# Patient Record
Sex: Male | Born: 1960 | Race: White | Hispanic: No | Marital: Married | Smoking: Former smoker
Health system: Southern US, Community
[De-identification: ages and names within clinical notes are randomized; demographics above are authoritative.]

## PROBLEM LIST (undated history)

## (undated) DIAGNOSIS — J449 Chronic obstructive pulmonary disease, unspecified: Secondary | ICD-10-CM

## (undated) DIAGNOSIS — J45909 Unspecified asthma, uncomplicated: Secondary | ICD-10-CM

## (undated) DIAGNOSIS — R768 Other specified abnormal immunological findings in serum: Secondary | ICD-10-CM

## (undated) DIAGNOSIS — G4700 Insomnia, unspecified: Secondary | ICD-10-CM

## (undated) DIAGNOSIS — G8929 Other chronic pain: Secondary | ICD-10-CM

## (undated) DIAGNOSIS — M549 Dorsalgia, unspecified: Secondary | ICD-10-CM

## (undated) DIAGNOSIS — R0602 Shortness of breath: Secondary | ICD-10-CM

## (undated) HISTORY — DX: Unspecified asthma, uncomplicated: J45.909

## (undated) HISTORY — DX: Other specified abnormal immunological findings in serum: R76.8

## (undated) HISTORY — DX: Dorsalgia, unspecified: M54.9

## (undated) HISTORY — DX: Insomnia, unspecified: G47.00

## (undated) HISTORY — DX: Other chronic pain: G89.29

---

## 2005-05-01 ENCOUNTER — Ambulatory Visit (HOSPITAL_COMMUNITY): Admission: RE | Admit: 2005-05-01 | Discharge: 2005-05-01 | Payer: Self-pay | Admitting: Family Medicine

## 2005-05-27 ENCOUNTER — Ambulatory Visit: Payer: Self-pay | Admitting: Critical Care Medicine

## 2005-07-08 ENCOUNTER — Ambulatory Visit: Payer: Self-pay | Admitting: Critical Care Medicine

## 2005-08-06 ENCOUNTER — Ambulatory Visit: Payer: Self-pay | Admitting: Critical Care Medicine

## 2005-08-20 ENCOUNTER — Ambulatory Visit: Payer: Self-pay | Admitting: Critical Care Medicine

## 2005-11-20 ENCOUNTER — Ambulatory Visit: Payer: Self-pay | Admitting: Critical Care Medicine

## 2006-01-09 ENCOUNTER — Encounter (HOSPITAL_COMMUNITY): Admission: RE | Admit: 2006-01-09 | Discharge: 2006-02-08 | Payer: Self-pay | Admitting: Preventative Medicine

## 2007-07-06 ENCOUNTER — Ambulatory Visit: Payer: Self-pay | Admitting: Critical Care Medicine

## 2007-07-06 DIAGNOSIS — J449 Chronic obstructive pulmonary disease, unspecified: Secondary | ICD-10-CM | POA: Insufficient documentation

## 2007-07-06 DIAGNOSIS — R0602 Shortness of breath: Secondary | ICD-10-CM

## 2007-07-06 DIAGNOSIS — R0609 Other forms of dyspnea: Secondary | ICD-10-CM | POA: Insufficient documentation

## 2007-07-06 DIAGNOSIS — J209 Acute bronchitis, unspecified: Secondary | ICD-10-CM

## 2008-03-25 ENCOUNTER — Ambulatory Visit (HOSPITAL_COMMUNITY): Admission: RE | Admit: 2008-03-25 | Discharge: 2008-03-25 | Payer: Self-pay | Admitting: Family Medicine

## 2008-03-29 ENCOUNTER — Ambulatory Visit (HOSPITAL_COMMUNITY): Admission: RE | Admit: 2008-03-29 | Discharge: 2008-03-29 | Payer: Self-pay | Admitting: Family Medicine

## 2009-08-28 ENCOUNTER — Ambulatory Visit (HOSPITAL_COMMUNITY): Admission: RE | Admit: 2009-08-28 | Discharge: 2009-08-28 | Payer: Self-pay | Admitting: Family Medicine

## 2010-10-14 LAB — BLOOD GAS, ARTERIAL
Acid-Base Excess: 1.6 mmol/L (ref 0.0–2.0)
Bicarbonate: 25.4 mEq/L — ABNORMAL HIGH (ref 20.0–24.0)
FIO2: 0.21 %
O2 Saturation: 96.5 %
Patient temperature: 37
TCO2: 21.9 mmol/L (ref 0–100)
pCO2 arterial: 38.2 mmHg (ref 35.0–45.0)
pH, Arterial: 7.438 (ref 7.350–7.450)
pO2, Arterial: 64.6 mmHg — ABNORMAL LOW (ref 80.0–100.0)

## 2010-12-11 NOTE — Assessment & Plan Note (Signed)
[facility]                             PULMONARY OFFICE NOTE   NAME:Banka, [place] M                        MRN:          [id number]  DATE:07/06/2007                            DOB:          [birth year]    HISTORY OF PRESENT ILLNESS:  The patient is a 50 year old male patient  of Dr. 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  The patient has not been seen in the office in  greater than a year and a half, reports that he has been doing well,  maintained on Advair 500/50 twice daily.  The patient did run out of his  Advair one week ago, has been noticing symptoms had been increasing over  the last 24 hours.  The patient denies any chest pain, orthopnea, PND,  wheezing.  The patient is followed by Dr. Gerda Diss and reports he had a  chest x-ray earlier this year.   PAST MEDICAL HISTORY:  Reviewed.   CURRENT MEDICATIONS:  Reviewed.   PHYSICAL EXAMINATION:  GENERAL:  The patient is a pleasant male in no  acute distress.  He is afebrile.  VITAL SIGNS:  Blood pressure re-checks 138/70, O2 saturation 96% on room  air.  HEENT:  Unremarkable.  NECK:  Supple without cervical adenopathy.  No JVD.  LUNGS:  Clear to auscultation bilaterally.  CARDIAC:  Regular rate and rhythm.  ABDOMEN:  Soft and nontender.  EXTREMITIES:  Warm without any edema.   IMPRESSION/PLAN:  Chronic asthmatic bronchitis.  Had recommended  possibly we decrease Advair down to 250.  However, the patient reports  that he has tried that in the past without success, with increased  symptomatology.  So, therefore we will continue on Advair 500/50 twice  daily.  The patient will return back with Dr. Delford Field in two to three  months for followup or sooner if needed.      Rubye Oaks, NP  Electronically Signed      Charlcie Cradle Delford Field, MD, [facility]  Electronically Signed   TP/MedQ  DD: 07/06/2007  DT: 07/07/2007  Job #: [id number]

## 2010-12-14 NOTE — Procedures (Signed)
[name], Timothy Davis                 ACCOUNT NO.:  [id number]   MEDICAL RECORD NO.:  [id number]          PATIENT TYPE:  OUT   LOCATION:  RAD                           FACILITY:  [facility]   PHYSICIAN:  Edward L. Juanetta Gosling, M.D.DATE OF BIRTH:  1960-10-12   DATE OF PROCEDURE:  05/01/2005  DATE OF DISCHARGE:                              PULMONARY FUNCTION TEST   RESULTS:  1.  Spirometry shows marked obstructive change with a moderate to severe      ventilatory defect.  2.  There is significant bronchodilator improvement.      Edward L. Juanetta Gosling, M.D.  Electronically Signed     ELH/MEDQ  D:  05/02/2005  T:  05/03/2005  Job:  [id number]

## 2011-05-01 LAB — CREATININE, SERUM
Creatinine, Ser: 0.98
GFR calc Af Amer: >60
GFR calc non Af Amer: >60

## 2012-03-02 ENCOUNTER — Other Ambulatory Visit: Payer: Self-pay

## 2012-03-02 DIAGNOSIS — J449 Chronic obstructive pulmonary disease, unspecified: Secondary | ICD-10-CM

## 2012-03-03 DIAGNOSIS — Z79899 Other long term (current) drug therapy: Secondary | ICD-10-CM | POA: Diagnosis not present

## 2012-03-03 DIAGNOSIS — Z125 Encounter for screening for malignant neoplasm of prostate: Secondary | ICD-10-CM | POA: Diagnosis not present

## 2012-03-03 DIAGNOSIS — Z0000 Encounter for general adult medical examination without abnormal findings: Secondary | ICD-10-CM | POA: Diagnosis not present

## 2012-03-05 ENCOUNTER — Ambulatory Visit (HOSPITAL_COMMUNITY)
Admission: RE | Admit: 2012-03-05 | Discharge: 2012-03-05 | Disposition: A | Discharge status: Home / Self Care | Payer: PRIVATE HEALTH INSURANCE | Source: Ambulatory Visit | Attending: Family Medicine | Admitting: Family Medicine

## 2012-03-05 ENCOUNTER — Telehealth: Payer: Self-pay

## 2012-03-05 DIAGNOSIS — R0989 Other specified symptoms and signs involving the circulatory and respiratory systems: Secondary | ICD-10-CM | POA: Insufficient documentation

## 2012-03-05 DIAGNOSIS — R0609 Other forms of dyspnea: Secondary | ICD-10-CM | POA: Insufficient documentation

## 2012-03-05 DIAGNOSIS — J449 Chronic obstructive pulmonary disease, unspecified: Secondary | ICD-10-CM | POA: Insufficient documentation

## 2012-03-05 DIAGNOSIS — Z139 Encounter for screening, unspecified: Secondary | ICD-10-CM

## 2012-03-05 DIAGNOSIS — J4489 Other specified chronic obstructive pulmonary disease: Secondary | ICD-10-CM | POA: Insufficient documentation

## 2012-03-05 LAB — BLOOD GAS, ARTERIAL
Acid-Base Excess: 0.5 mmol/L (ref 0.0–2.0)
O2 Saturation: 96.2 %
Patient temperature: 37
TCO2: 21.3 mmol/L (ref 0–100)
pCO2 arterial: 38.4 mmHg (ref 35.0–45.0)
pH, Arterial: 7.421 (ref 7.350–7.450)
pO2, Arterial: 85.4 mmHg (ref 80.0–100.0)

## 2012-03-05 MED ORDER — ALBUTEROL SULFATE (5 MG/ML) 0.5% IN NEBU
2.5000 mg | INHALATION_SOLUTION | Freq: Once | RESPIRATORY_TRACT | Status: AC
  Administered 2012-03-05: 2.5 mg via RESPIRATORY_TRACT

## 2012-03-05 NOTE — Telephone Encounter (Signed)
PT left message on VM he was referred by Dr/ Lubertha South for colonoscopy. I called, LMOM for a return call.

## 2012-03-06 NOTE — Telephone Encounter (Addendum)
Gastroenterology Pre-Procedure Form  PT HAD COPD  Request Date: 03/05/2012      Requesting Physician: Dr. Lubertha South     PATIENT INFORMATION:  Timothy Davis is a 51 y.o., male (DOB=11-29-1960).  PROCEDURE: Procedure(s) requested: colonoscopy Procedure Reason: screening for colon cancer  PATIENT REVIEW QUESTIONS: The patient reports the following:   1. Diabetes Melitis: no 2. Joint replacements in the past 12 months: no 3. Major health problems in the past 3 months: no 4. Has an artificial valve or MVP:no 5. Has been advised in past to take antibiotics in advance of a procedure like teeth cleaning: no}    MEDICATIONS & ALLERGIES:    Patient reports the following regarding taking any blood thinners:   Plavix? no Aspirin? NO Coumadin?  no  Patient confirms/reports the following medications:  Current Outpatient Prescriptions  Medication Sig Dispense Refill  . budesonide-formoterol (SYMBICORT) 160-4.5 MCG/ACT inhaler Inhale 2 puffs into the lungs 2 (two) times daily.      Marland Kitchen HYDROcodone-acetaminophen (NORCO/VICODIN) 5-325 MG per tablet Take 1 tablet by mouth every 6 (six) hours as needed. One tablet daily and sometimes not even one      . NON FORMULARY Ventolin inhaler    As directed      . predniSONE (DELTASONE) 10 MG tablet Take 10 mg by mouth daily.      . ranitidine (ZANTAC) 300 MG tablet Take 300 mg by mouth 2 (two) times daily. Taking bid for 7 days then will take one daily      . sulfamethoxazole-trimethoprim (BACTRIM DS,SEPTRA DS) 800-160 MG per tablet Take 1 tablet by mouth 2 (two) times daily.      Marland Kitchen zolpidem (AMBIEN) 10 MG tablet Take 10 mg by mouth at bedtime as needed.       No current facility-administered medications for this visit.   Facility-Administered Medications Ordered in Other Visits  Medication Dose Route Frequency Provider Last Rate Last Dose  . albuterol (PROVENTIL) (5 MG/ML) 0.5% nebulizer solution 2.5 mg  2.5 mg Nebulization Once Babs Sciara, MD   2.5  mg at 03/05/12 1459    Patient confirms/reports the following allergies:  No Known Allergies  Patient is appropriate to schedule for requested procedure(s): yes  AUTHORIZATION INFORMATION Primary Insurance:   ID #:  Group #:  Pre-Cert / Auth required: Pre-Cert / Auth #:   Secondary Insurance:  ID #: Group #:  Pre-Cert / Auth required: Pre-Cert / Auth #:   No orders of the defined types were placed in this encounter.    SCHEDULE INFORMATION: Procedure has been scheduled as follows:  Date:  04/14/2012                  Time:  12:30 PM Location: [facility] Short Stay  This Gastroenterology Pre-Precedure Form is being routed to the following provider(s) for review: Jonette Eva, MD

## 2012-03-09 NOTE — Procedures (Signed)
[name], [name]                 ACCOUNT NO.:  [id number]  MEDICAL RECORD NO.:  [id number]  LOCATION:                                 FACILITY:  PHYSICIAN:  Abu Heavin L. Juanetta Davis, M.D.DATE OF BIRTH:  [birth year]  DATE OF PROCEDURE: DATE OF DISCHARGE:                           PULMONARY FUNCTION TEST   Reason for pulmonary function testing is COPD.  1. Spirometry shows a severe ventilatory defect with airflow     obstruction. 2. Lung volumes show fairly marked air trapping. 3. DLCO is moderately reduced. 4. Airway resistance is elevated, confirming the presence of airflow     obstruction. 5. There is significant bronchodilator improvement. 6. Arterial blood gas shows normal oxygenation and no evidence of     respiratory failure. 7. This study is consistent with clinical diagnosis of COPD.     Timothy Davis, M.D.     ELH/MEDQ  D:  03/08/2012  T:  03/08/2012  Job:  [id number]  cc:   Donna Bernard, M.D. Fax: [phone]

## 2012-03-11 ENCOUNTER — Other Ambulatory Visit: Payer: Self-pay

## 2012-03-11 DIAGNOSIS — Z139 Encounter for screening, unspecified: Secondary | ICD-10-CM

## 2012-03-11 NOTE — Telephone Encounter (Signed)
MOVI PREP SPLIT DOSING, REGULAR BREAKFAST. CLEAR LIQUIDS AFTER 9 AM.  

## 2012-03-16 ENCOUNTER — Other Ambulatory Visit: Payer: Self-pay | Admitting: Family Medicine

## 2012-03-16 DIAGNOSIS — R748 Abnormal levels of other serum enzymes: Secondary | ICD-10-CM | POA: Diagnosis not present

## 2012-03-17 ENCOUNTER — Other Ambulatory Visit (HOSPITAL_COMMUNITY): Payer: Self-pay | Admitting: Family Medicine

## 2012-03-17 ENCOUNTER — Other Ambulatory Visit: Payer: Self-pay | Admitting: Family Medicine

## 2012-03-17 ENCOUNTER — Ambulatory Visit (HOSPITAL_COMMUNITY)
Admission: RE | Admit: 2012-03-17 | Discharge: 2012-03-17 | Disposition: A | Discharge status: Home / Self Care | Payer: PRIVATE HEALTH INSURANCE | Source: Ambulatory Visit | Attending: Family Medicine | Admitting: Family Medicine

## 2012-03-17 DIAGNOSIS — R748 Abnormal levels of other serum enzymes: Secondary | ICD-10-CM | POA: Diagnosis not present

## 2012-03-17 DIAGNOSIS — J449 Chronic obstructive pulmonary disease, unspecified: Secondary | ICD-10-CM | POA: Diagnosis not present

## 2012-03-17 DIAGNOSIS — R932 Abnormal findings on diagnostic imaging of liver and biliary tract: Secondary | ICD-10-CM | POA: Insufficient documentation

## 2012-03-17 DIAGNOSIS — J4489 Other specified chronic obstructive pulmonary disease: Secondary | ICD-10-CM | POA: Diagnosis not present

## 2012-03-17 DIAGNOSIS — R7982 Elevated C-reactive protein (CRP): Secondary | ICD-10-CM | POA: Diagnosis not present

## 2012-03-19 MED ORDER — PEG 3350/ELECTROLYTES 240 G PO SOLR: ORAL | Status: DC

## 2012-03-19 NOTE — Telephone Encounter (Signed)
Pt's insurance requested use of the Tyilyte Prep. Sent Rx. Instructions for the [facility] mailed to pt.

## 2012-03-25 LAB — PULMONARY FUNCTION TEST

## 2012-03-31 ENCOUNTER — Encounter (HOSPITAL_COMMUNITY): Payer: Self-pay | Admitting: Pharmacy Technician

## 2012-04-01 ENCOUNTER — Telehealth: Payer: Self-pay

## 2012-04-01 NOTE — Telephone Encounter (Signed)
Pt has [employer]  Community education officer) primary ins and Medicare secondary.   I called [phone]  And spoke to [facility]. She said that they do not require a precert for a screening or a diagnostic colonoscopy.  Issue number [id number].

## 2012-04-02 ENCOUNTER — Telehealth: Payer: Self-pay

## 2012-04-02 NOTE — Telephone Encounter (Signed)
Received a referral from Dr. Lubertha South for pt to be seen for Hep C. He is already scheduled for colonoscopy with Dr.Fields on 04/14/2012.  Dr. Darrick Penna reviewed the referral and said that pt will not need EGD now. She said to schedule him for OV after his procedure to follow his Hep C.  Routing to [place] to schedule,

## 2012-04-02 NOTE — Telephone Encounter (Signed)
Left message for pt to call to set up appt °

## 2012-04-07 ENCOUNTER — Telehealth: Payer: Self-pay

## 2012-04-07 NOTE — Telephone Encounter (Signed)
REVIEWED.  

## 2012-04-07 NOTE — Telephone Encounter (Signed)
Pt is scheduled on 04/23/2012 at 8:45 AM with Dr. Darrick Penna.

## 2012-04-07 NOTE — Telephone Encounter (Signed)
Called pt to update triage. He is scheduled for 04/14/2012. Informed him his time has been moved to 12:00 noon and he should be at the hospital at 11:00 AM to register.  He has not had any change in meds and no new problems.

## 2012-04-14 ENCOUNTER — Encounter (HOSPITAL_COMMUNITY): Payer: Self-pay

## 2012-04-14 ENCOUNTER — Ambulatory Visit (HOSPITAL_COMMUNITY)
Admission: RE | Admit: 2012-04-14 | Discharge: 2012-04-14 | Disposition: A | Discharge status: Home / Self Care | Payer: PRIVATE HEALTH INSURANCE | Source: Ambulatory Visit | Attending: Gastroenterology | Admitting: Gastroenterology

## 2012-04-14 ENCOUNTER — Encounter (HOSPITAL_COMMUNITY)
Admission: RE | Disposition: A | Discharge status: Home / Self Care | Payer: Self-pay | Source: Ambulatory Visit | Attending: Gastroenterology

## 2012-04-14 DIAGNOSIS — D128 Benign neoplasm of rectum: Secondary | ICD-10-CM | POA: Diagnosis not present

## 2012-04-14 DIAGNOSIS — Z139 Encounter for screening, unspecified: Secondary | ICD-10-CM

## 2012-04-14 DIAGNOSIS — D126 Benign neoplasm of colon, unspecified: Secondary | ICD-10-CM | POA: Diagnosis not present

## 2012-04-14 DIAGNOSIS — D129 Benign neoplasm of anus and anal canal: Secondary | ICD-10-CM | POA: Insufficient documentation

## 2012-04-14 DIAGNOSIS — K620 Anal polyp: Secondary | ICD-10-CM

## 2012-04-14 DIAGNOSIS — K648 Other hemorrhoids: Secondary | ICD-10-CM | POA: Diagnosis not present

## 2012-04-14 DIAGNOSIS — J449 Chronic obstructive pulmonary disease, unspecified: Secondary | ICD-10-CM | POA: Diagnosis not present

## 2012-04-14 DIAGNOSIS — Z1211 Encounter for screening for malignant neoplasm of colon: Secondary | ICD-10-CM | POA: Diagnosis not present

## 2012-04-14 DIAGNOSIS — K5730 Diverticulosis of large intestine without perforation or abscess without bleeding: Secondary | ICD-10-CM | POA: Diagnosis not present

## 2012-04-14 DIAGNOSIS — K621 Rectal polyp: Secondary | ICD-10-CM | POA: Diagnosis not present

## 2012-04-14 DIAGNOSIS — J4489 Other specified chronic obstructive pulmonary disease: Secondary | ICD-10-CM | POA: Insufficient documentation

## 2012-04-14 HISTORY — DX: Shortness of breath: R06.02

## 2012-04-14 HISTORY — PX: COLONOSCOPY: SHX5424

## 2012-04-14 HISTORY — DX: Chronic obstructive pulmonary disease, unspecified: J44.9

## 2012-04-14 SURGERY — COLONOSCOPY
Anesthesia: Moderate Sedation

## 2012-04-14 MED ORDER — SODIUM CHLORIDE 0.45 % IV SOLN
INTRAVENOUS | Status: DC
  Administered 2012-04-14: 12:00:00 via INTRAVENOUS

## 2012-04-14 MED ORDER — MIDAZOLAM HCL 5 MG/5ML IJ SOLN
INTRAMUSCULAR | Status: AC
  Filled 2012-04-14: qty 10

## 2012-04-14 MED ORDER — SPOT INK MARKER SYRINGE KIT
PACK | SUBMUCOSAL | Status: DC | PRN
  Administered 2012-04-14: 1 mL via SUBMUCOSAL

## 2012-04-14 MED ORDER — MEPERIDINE HCL 100 MG/ML IJ SOLN
INTRAMUSCULAR | Status: DC | PRN
  Administered 2012-04-14 (×2): 25 mg via INTRAVENOUS
  Administered 2012-04-14: 50 mg via INTRAVENOUS

## 2012-04-14 MED ORDER — MEPERIDINE HCL 100 MG/ML IJ SOLN
INTRAMUSCULAR | Status: AC
  Filled 2012-04-14: qty 1

## 2012-04-14 MED ORDER — MIDAZOLAM HCL 5 MG/5ML IJ SOLN
INTRAMUSCULAR | Status: DC | PRN
  Administered 2012-04-14: 1 mg via INTRAVENOUS
  Administered 2012-04-14 (×2): 2 mg via INTRAVENOUS
  Administered 2012-04-14: 1 mg via INTRAVENOUS

## 2012-04-14 MED ORDER — STERILE WATER FOR IRRIGATION IR SOLN
Status: DC | PRN
  Administered 2012-04-14: 13:00:00

## 2012-04-14 NOTE — H&P (Signed)
  Primary Care Physician:  Harlow Asa, MD Primary Gastroenterologist:  Dr. Darrick Penna  Pre-Procedure History & Physical: HPI:  Timothy Davis is a 51 y.o. male here for COLON CANCER SCREENING.   Past Medical History  Diagnosis Date  . Shortness of breath   . COPD (chronic obstructive pulmonary disease)   . Hepatitis     questionabe   B, can't give blood anymore    History reviewed. No pertinent past surgical history.  Prior to Admission medications   Medication Sig Start Date End Date Taking? Authorizing Provider  albuterol (PROVENTIL HFA;VENTOLIN HFA) 108 (90 BASE) MCG/ACT inhaler Inhale 2 puffs into the lungs every 6 (six) hours as needed. For shortness of breath   Yes Historical Provider, MD  budesonide-formoterol (SYMBICORT) 160-4.5 MCG/ACT inhaler Inhale 2 puffs into the lungs 2 (two) times daily.   Yes Historical Provider, MD  HYDROcodone-acetaminophen (NORCO/VICODIN) 5-325 MG per tablet Take 1 tablet by mouth every 6 (six) hours as needed. One tablet daily and sometimes not even one   Yes Historical Provider, MD  zolpidem (AMBIEN) 10 MG tablet Take 10 mg by mouth at bedtime as needed.   Yes Historical Provider, MD    Allergies as of 03/11/2012  . (No Known Allergies)    History reviewed. No pertinent family history.  History   Social History  . Marital Status: Married    Spouse Name: N/A    Number of Children: N/A  . Years of Education: N/A   Occupational History  . Not on file.   Social History Main Topics  . Smoking status: Not on file  . Smokeless tobacco: Not on file  . Alcohol Use: No     rarely   . Drug Use:   . Sexually Active:    Other Topics Concern  . Not on file   Social History Narrative  . No narrative on file    Review of Systems: See HPI, otherwise negative ROS   Physical Exam: BP 113/86  Pulse 70  Temp 97.6 F (36.4 C) (Oral)  Resp 18  Ht 6' (1.829 m)  Wt 155 lb (70.308 kg)  BMI 21.02 kg/m2 General:   Alert,  pleasant and  cooperative in NAD Head:  Normocephalic and atraumatic. Neck:  Supple; Lungs:  Clear throughout to auscultation.    Heart:  Regular rate and rhythm. Abdomen:  Soft, nontender and nondistended. Normal bowel sounds, without guarding, and without rebound.   Neurologic:  Alert and  oriented x4;  grossly normal neurologically.  Impression/Plan:     SCREENING  Plan:  1. TCS TODAY

## 2012-04-14 NOTE — Op Note (Signed)
[facility] [address] [place], [address]   COLONOSCOPY PROCEDURE REPORT  PATIENT: Timothy Davis, Timothy Davis  MR#: [id number] BIRTHDATE: July 12, 1961 , 51  yrs. old GENDER: Male ENDOSCOPIST: Jonette Eva, MD REFERRED NF:[id number] Gerda Diss, M.D. PROCEDURE DATE:  04/14/2012 PROCEDURE:   Colonoscopy with biopsy and Colonoscopy with snare polypectomy INDICATIONS:average risk patient for colon cancer. MEDICATIONS: Demerol 100 mg IV and Versed 6 mg IV  DESCRIPTION OF PROCEDURE:    Physical exam was performed.  Informed consent was obtained from the patient after explaining the benefits, risks, and alternatives to procedure.  The patient was connected to monitor and placed in left lateral position. Continuous oxygen was provided by nasal cannula and IV medicine administered through an indwelling cannula.  After administration of sedation and rectal exam, the patients rectum was intubated and the EC-[id number] ([id number])  colonoscope was advanced under direct visualization to the cecum.  The scope was removed slowly by carefully examining the color, texture, anatomy, and integrity mucosa on the way out.  The patient was recovered in endoscopy and discharged home in satisfactory condition.       COLON FINDINGS: FIVE sessile polyps were found at the splenic flexure(3) and in the rectum (2).  A polypectomy was performed with cold forceps and using snare cautery.  ONE 1.2 CM SEMI-PEDUNCULATED POLYP REMOVED VIA SNARE CAUTERY. 1 CC SPOT TATTO TO MARK BASE OF POLYP. Mild diverticulosis was noted in the sigmoid colon.  , and Internal hemorrhoids were found.  PREP QUALITY: good. CECAL W/D TIME: 26.5 minutes  COMPLICATIONS: None  ENDOSCOPIC IMPRESSION: 1.   Six sessile polyps were found at the splenic flexure and in the rectum; polypectomy was performed with cold forceps and using snare cautery 2.   Mild diverticulosis was noted in the sigmoid colon 3.   Internal  hemorrhoids   RECOMMENDATIONS: 1.  FOLLOW A HIGH FIBER DIET.  AVOID ITEMS THAT CAUSE BLOATING.  BIOPSY RESULTS SHOULD BE BACK IN 7 DAYS.  Next colonoscopy in 3-5 years.  ALL FIRST DEGREE RELATIVES NEED TCS AT AGE 55. 2.  FOLLOW A HIGH FIBER DIET.  AVOID ITEMS THAT CAUSE BLOATING.  BIOPSY RESULTS SHOULD BE BACK IN 7 DAYS.  Next colonoscopy in 3-5 years.  ALL FIRST DEGREE RELATIVES NEED TCS AT AGE 55 DUE TO REMOVAL OF POLYP > 1 CM.       _______________________________ Rosalie DoctorJonette Eva, MD 04/14/2012 2:26 PM     PATIENT NAME:  Timothy Davis, Timothy Davis MR#: [id number]

## 2012-04-15 ENCOUNTER — Encounter: Payer: Self-pay | Admitting: Gastroenterology

## 2012-04-16 ENCOUNTER — Encounter (HOSPITAL_COMMUNITY): Payer: Self-pay | Admitting: Gastroenterology

## 2012-04-20 ENCOUNTER — Telehealth: Payer: Self-pay | Admitting: Gastroenterology

## 2012-04-20 NOTE — Telephone Encounter (Signed)
Please call pt. HE had simple adenomas removed from HIS colon.    FOLLOW A HIGH FIBER DIET. AVOID ITEMS THAT CAUSE BLOATING.   Next colonoscopy in 5 years.  ALL FIRST DEGREE RELATIVES NEED TCS AT AGE 51.

## 2012-04-21 NOTE — Telephone Encounter (Signed)
Faxed to PCP, recall made  

## 2012-04-21 NOTE — Telephone Encounter (Signed)
Called and informed pt.  

## 2012-04-22 ENCOUNTER — Encounter: Payer: Self-pay | Admitting: Gastroenterology

## 2012-04-23 ENCOUNTER — Encounter: Payer: Self-pay | Admitting: Urgent Care

## 2012-04-23 ENCOUNTER — Other Ambulatory Visit: Payer: Self-pay

## 2012-04-23 ENCOUNTER — Other Ambulatory Visit: Payer: Self-pay | Admitting: Urgent Care

## 2012-04-23 ENCOUNTER — Ambulatory Visit: Payer: PRIVATE HEALTH INSURANCE | Admitting: Gastroenterology

## 2012-04-23 ENCOUNTER — Ambulatory Visit (INDEPENDENT_AMBULATORY_CARE_PROVIDER_SITE_OTHER): Payer: PRIVATE HEALTH INSURANCE | Admitting: Urgent Care

## 2012-04-23 VITALS — BP 125/86 | HR 58 | Temp 97.4°F | Ht 72.0 in | Wt 159.8 lb

## 2012-04-23 DIAGNOSIS — B1710 Acute hepatitis C without hepatic coma: Secondary | ICD-10-CM

## 2012-04-23 DIAGNOSIS — D126 Benign neoplasm of colon, unspecified: Secondary | ICD-10-CM | POA: Insufficient documentation

## 2012-04-23 DIAGNOSIS — R768 Other specified abnormal immunological findings in serum: Secondary | ICD-10-CM

## 2012-04-23 DIAGNOSIS — R894 Abnormal immunological findings in specimens from other organs, systems and tissues: Secondary | ICD-10-CM | POA: Diagnosis not present

## 2012-04-23 DIAGNOSIS — B1920 Unspecified viral hepatitis C without hepatic coma: Secondary | ICD-10-CM | POA: Insufficient documentation

## 2012-04-23 NOTE — Progress Notes (Signed)
Faxed to PCP

## 2012-04-23 NOTE — Patient Instructions (Addendum)
Go get your labs.  We will call with results. Use Condoms I recommend your wife speak with her doctor about getting tested for hepatitis C Next colonoscopy Sept 2016 Here's information on Hepatitis C, however until labs are back we do not know whether you have active infection. Hepatitis C Hepatitis C is a viral infection of the liver. Infection may go undetected for months or years because symptoms may be absent or very mild. Chronic liver disease is the main danger of hepatitis C. This may lead to scarring of the liver (cirrhosis), liver failure, and liver cancer. CAUSES  Hepatitis C is caused by the hepatitis C virus (HCV). Formerly, hepatitis C infections were most commonly transmitted through blood transfusions. In the early 1990s, routine testing of donated blood for hepatitis C and exclusion of blood that tests positive for HCV began. Now, HCV is most commonly transmitted from person to person through injection drug use, sharing needles, or sex with an infected person. A caregiver may also get the infection from exposure to the blood of an infected patient by way of a cut or needle stick.  SYMPTOMS  Acute Phase Many cases of acute HCV infection are mild and cause few problems.Some people may not even realize they are sick.Symptoms in others may last a few weeks to several months and include:  Feeling very tired.   Loss of appetite.   Nausea.   Vomiting.   Abdominal pain.   Dark yellow urine.   Yellow skin and eyes (jaundice).   Itching of the skin.  Chronic Phase  Between 50% to 85% of people who get HCV infection become "chronic carriers." They often have no symptoms, but the virus stays in their body.They may spread the virus to others and can get long-term liver disease.   Many people with chronic HCV infection remain healthy for many years. However, up to 1 in 5 chronically infected people may develop severe liver diseases including scarring of the liver (cirrhosis),  liver failure, or liver cancer.  DIAGNOSIS  Diagnosis of hepatitis C infection is made by testing blood for the presence of hepatitis C viral particles called RNA. Other tests may also be done to measure the status of current liver function, exclude other liver problems, or assess liver damage. TREATMENT  Treatment with many antiviral drugs is available and recommended for some patients with chronic HCV infection. Drug treatment is generally considered appropriate for patients who:  Are 61 years of age or older.   Have a positive test for HCV particles in the blood.   Have a liver tissue sample (biopsy) that shows chronic hepatitis and significant scarring (fibrosis).   Do not have signs of liver failure.   Have acceptable blood test results that confirm the wellness of other body organs.   Are willing to be treated and conform to treatment requirements.   Have no other circumstances that would prevent treatment from being recommended (contraindications).  All people who are offered and choose to receive drug treatment must understand that careful medical follow up for many months and even years is crucial in order to make successful care possible. The goal of drug treatment is to eliminate any evidence of HCV in the blood on a long-term basis. This is called a "sustained virologic response" or SVR. Achieving a SVR is associated with a decrease in the chance of life-threatening liver problems, need for a liver transplant, liver cancer rates, and liver-related complications. Successful treatment currently requires taking treatment drugs for at  least 24 weeks and up to 72 weeks. An injected drug (interferon) given weekly and an oral antiviral medicine taken daily are usually prescribed. Side effects from these drugs are common and some may be very serious. Your response to treatment must be carefully monitored by both you and your caregiver throughout the entire treatment period. PREVENTION There  is no vaccine for hepatitis C. The only way to prevent the disease is to reduce the risk of exposure to the virus.   Avoid sharing drug needles or personal items like toothbrushes, razors, and nail clippers with an infected person.   Healthcare workers need to avoid injuries and wear appropriate protective equipment such as gloves, gowns, and face masks when performing invasive medical or nursing procedures.  HOME CARE INSTRUCTIONS  To avoid making your liver disease worse:  Strictly avoid drinking alcohol.   Carefully review all new prescriptions of medicines with your caregiver. Ask your caregiver which drugs you should avoid. The following drugs are toxic to the liver, and your caregiver may tell you to avoid them:   Isoniazid.   Methyldopa.   Acetaminophen.   Anabolic steroids (muscle-building drugs).   Erythromycin.   Oral contraceptives (birth control pills).   Check with your caregiver to make sure medicine you are currently taking will not be harmful.   Periodic blood tests may be required. Follow your caregiver's advice about when you should have blood tests.   Avoid a sexual relationship until advised otherwise by your caregiver.   Avoid activities that could expose other people to your blood. Examples include sharing a toothbrush, nail clippers, razors, and needles.   Bed rest is not necessary, but it may make you feel better. Recovery time is not related to the amount of rest you receive.   This infection is contagious. Follow your caregiver's instructions in order to avoid spread of the infection.  SEEK IMMEDIATE MEDICAL CARE IF:  You have increasing fatigue or weakness.   You have an oral temperature above 102 F (38.9 C), not controlled by medicine.   You develop loss of appetite, nausea, or vomiting.   You develop jaundice.   You develop easy bruising or bleeding.   You develop any severe problems as a result of your treatment.  MAKE SURE YOU:    Understand these instructions.   Will watch your condition.   Will get help right away if you are not doing well or get worse.  Document Released: 07/12/2000 Document Revised: 07/04/2011 Document Reviewed: 11/14/2010 [facility] Patient Information 2012 [place], [place].     Safer Sex Your caregiver wants you to have this information about the infections that can be transmitted from sexual contact and how to prevent them. The idea behind safer sex is that you can be sexually active, and at the same time reduce the risk of giving or getting a sexually transmitted disease (STD). Every person should be aware of how to prevent him or herself and his or her sex partner from getting an STD. CAUSES OF STDS STDs are transmitted by sharing body fluids, which contain viruses and bacteria. The following fluids all transmit infections during sexual intercourse and sex acts:  Semen.   Saliva.   Urine.   Blood.   Vaginal mucus.   Sexual diseases often cause few or no symptoms until they are advanced, so a person can be infected and spread the infection without knowing it. Some STDs respond to treatment very well. Others, like HIV and herpes, cannot be cured, but  are treated to reduce their effects. Specific symptoms include:  Abnormal vaginal discharge.   Irritation or itching in and around the vagina, and in the pubic hair.   Pain during sexual intercourse.   Bleeding during sexual intercourse.   Pelvic or abdominal pain.   Fever.   Growths in and around the vagina.   An ulcer in or around the vagina.   Swollen glands in the groin area.  DIAGNOSIS   Blood tests.   Pap test.   Culture test of abnormal vaginal discharge.   A test that applies a solution and examines the cervix with a lighted magnifying scope (colposcopy).   A test that examines the pelvis with a lighted tube, through a small incision (laparoscopy).  TREATMENT  The treatment will depend on the cause of the  STD.  Antibiotic treatment by injection, oral, creams, or suppositories in the vagina.   Over-the-counter medicated shampoo, to get rid of pubic lice.   Removing or treating growths with medicine, freezing, burning (electrocautery), or surgery.   Surgery treatment for HPV of the cervix.   Supportive medicines for herpes, HIV, AIDS, and hepatitis.  Being careful cannot eliminate all risk of infection, but sex can be made much safer. Safe sexual practices include body massage and gentle touching. Masturbation is safe, as long as body fluids do not contact skin that has sores or cuts. Dry kissing and oral sex on a man wearing a latex condom or on a woman wearing a male condom is also safe. Slightly less safe is intercourse while the man wears a latex condom or wet kissing. It is also safer to have one sex partner that you know is not having sex with anyone else. LENGTH OF ILLNESS An STD might be treated and cured in a week, sometimes a month, or more. And it can linger with symptoms for many years. STDs can also cause damage to the male organs. This can cause chronic pain, infertility, and recurrence of the STD, especially herpes, hepatitis, HIV, and HPV. HOME CARE INSTRUCTIONS AND PREVENTION  Alcohol and recreational drugs are often the reason given for not practicing safer sex. These substances affect your judgment. Alcohol and recreational drugs can also impair your immune system, making you more vulnerable to disease.   Do not engage in risky and dangerous sexual practices, including:   Vaginal or anal sex without a condom.   Oral sex on a man without a condom.   Oral sex on a woman without a male condom.   Using saliva to lubricate a condom.   Any other sexual contact in which body fluids or blood from one partner contact the other partner.   You should use only latex condoms for men and water soluble lubricants. Petroleum based lubricants or oils used to lubricate a condom  will weaken the condom and increase the chance that it will break.   Think very carefully before having sex with anyone who is high risk for STDs and HIV. This includes IV drug users, people with multiple sexual partners, or people who have had an STD, or a positive hepatitis or HIV blood test.   Remember that even if your partner has had only one previous partner, their previous partner might have had multiple partners. If so, you are at high risk of being exposed to an STD. You and your sex partner should be the only sex partners with each other, with no one else involved.   A vaccine is available for hepatitis  B and HPV through your caregiver or the Public Health Department. Everyone should be vaccinated with these vaccines.   Avoid risky sex practices. Sex acts that can break the skin make you more likely to get an STD.  SEEK MEDICAL CARE IF:   If you think you have an STD, even if you do not have any symptoms. Contact your caregiver for evaluation and treatment, if needed.   You think or know your sex partner has acquired an STD.   You have any of the symptoms mentioned above.  Document Released: 08/22/2004 Document Revised: 07/04/2011 Document Reviewed: 06/14/2009 [facility] Patient Information 2012 [place], [place].

## 2012-04-23 NOTE — Progress Notes (Signed)
Primary Care Physician:  Harlow Asa, MD Primary Gastroenterologist:  Dr. Jonette Eva  Chief Complaint  Patient presents with  . Follow-up  . Hepatitis C    HPI:  Timothy Davis is a 51 y.o. male here for evaluation of a new problem with an elevated hepatitis C antibody.  He presented for routine physical to Dr Gerda Diss.  He was found to have elevated LFTs.  He gives hx of being told he had hepatitis C after trying to donate blood with [facility] over 15 yrs ago.  Recheck of Hepatitis C AB was positive.  He has hx intranasal drug use years ago, as well as etoh abuse.  Nothing in years as far as polysubstance abuse.  No hx of blood transfusions.  No tattoos.  No hx jaundice.   He had an abdominal ultrasound 03/17/12 shows normal liver.  Iron 218, TIBC 113, UIBC 331, %sat 66, ferritin 595, Hep B sAg negative.   03/03/12 AST 88, ALT 133.  Otherwise normal LFTs.  Met 7 & PSA normal.  Past Medical History  Diagnosis Date  . Shortness of breath   . COPD (chronic obstructive pulmonary disease)   . Hepatitis C antibody test positive     noticed trying to give blood  . Chronic back pain   . Insomnia     Past Surgical History  Procedure Date  . Colonoscopy 04/14/2012    Fields-6 polyps-tubular adenoma x 3 and hyperplastic polyps x 2/internal hemorrhoids/mild diverticulosis in the sigmoid colon, 1CM TA rectum    Current Outpatient Prescriptions  Medication Sig Dispense Refill  . albuterol (PROVENTIL HFA;VENTOLIN HFA) 108 (90 BASE) MCG/ACT inhaler Inhale 2 puffs into the lungs every 6 (six) hours as needed. For shortness of breath      . budesonide-formoterol (SYMBICORT) 160-4.5 MCG/ACT inhaler Inhale 2 puffs into the lungs 2 (two) times daily.      Marland Kitchen HYDROcodone-acetaminophen (NORCO/VICODIN) 5-325 MG per tablet Take 1 tablet by mouth every 6 (six) hours as needed. One tablet daily and sometimes not even one      . zolpidem (AMBIEN) 10 MG tablet Take 10 mg by mouth at bedtime as needed.         Allergies as of 04/23/2012  . (No Known Allergies)    Family History:There is no known family history of colorectal carcinoma , liver disease, or inflammatory bowel disease.  Problem Relation Age of Onset  . COPD Mother     History   Social History  . Marital Status: Married    Spouse Name: N/A    Number of Children: 2  . Years of Education: N/A   Occupational History  . disabled    Social History Main Topics  . Smoking status: Former Smoker -- 0.5 packs/day for 25 years    Types: Cigarettes    Quit date: 04/23/2002  . Smokeless tobacco: Not on file  . Alcohol Use: No     Hx heavy etoh (daily) x 35yrs, QUIT 52yrs ago  . Drug Use: No     Hx marijuana, intranasal drugs, crack, cocaine.  QUIT 5 yrs ago  . Sexually Active: Yes -- Male partner(s)     monagamous   Other Topics Concern  . Not on file   Social History Narrative   Lives w/ wife, 2sons  Review of Systems: Gen: Denies any fever, chills, sweats, anorexia, fatigue, weakness, malaise, weight loss, and sleep disorder CV: Denies chest pain, angina, palpitations, syncope, orthopnea, PND, peripheral edema, and claudication.  Resp: chronic non-productive cough.  Denies hemoptysis. GI: Denies vomiting blood or fecal incontinence.   Denies dysphagia or odynophagia. GU : Denies urinary burning, blood in urine, urinary frequency, urinary hesitancy, nocturnal urination, and urinary incontinence. MS: Chronic low back pain.   Derm: Denies rash, itching, dry skin, hives, moles, warts, or unhealing ulcers.  Psych: Denies depression, anxiety, memory loss, suicidal ideation, hallucinations, paranoia, and confusion. Heme: Denies bruising, bleeding, and enlarged lymph nodes. Neuro:  Denies any headaches, dizziness, paresthesias. Endo:  Denies any problems with DM, thyroid, adrenal function.  Physical Exam: BP 125/86  Pulse 58  Temp 97.4 F (36.3 C) (Temporal)  Ht 6' (1.829 m)  Wt 159 lb 12.8 oz (72.485 kg)  BMI 21.67  kg/m2 No LMP for male patient. General:   Alert,  Well-developed, well-nourished, pleasant and cooperative in NAD.  Accompanied by his lovely wife. Head:  Normocephalic and atraumatic. Eyes:  Sclera clear, no icterus.   Conjunctiva pink. Ears:  Normal auditory acuity. Nose:  No deformity, discharge, or lesions. Mouth:  No deformity or lesions,oropharynx pink & moist. Neck:  Supple; no masses or thyromegaly. Lungs:  Clear throughout to auscultation.   No wheezes, crackles, or rhonchi. No acute distress. Heart:  Regular rate and rhythm; no murmurs, clicks, rubs,  or gallops. Abdomen:  Normal bowel sounds.  No bruits.  Soft, non-tender and non-distended without masses, hepatosplenomegaly or hernias noted.  No guarding or rebound tenderness.   Rectal:  Deferred. Msk:  Symmetrical without gross deformities. Normal posture. Pulses:  Normal pulses noted. Extremities:  No clubbing or edema. Neurologic:  Alert and  oriented x4;  grossly normal neurologically. Skin:  Intact without significant lesions or rashes. Lymph Nodes:  No significant cervical adenopathy. Psych:  Alert and cooperative. Normal mood and affect.

## 2012-04-23 NOTE — Assessment & Plan Note (Addendum)
Timothy Davis is a pleasant 51 y.o. male with positive hepatitis C antibody that dates back over 15 yrs.  His risk factors including intranasal drug use.  No prior hx of treatment.  No evidence of cirrhosis noted on ultrasound.  Will need Hep C quant RNA by PCR to look for chronic hepatitis C viremia.  His COPD may complicate treatment options if he has chronic HCV.  Discussed Hepatitis C, advised to use condoms, etc, however he was told that we could not confirm chronic hepatitis C without labs  Recommend his wife speak with her doctor about getting tested for hepatitis C HCV RNA quant PCR, genotype, CBC, INR

## 2012-04-24 LAB — CBC WITH DIFFERENTIAL/PLATELET
Basophils Absolute: 0 10*3/uL (ref 0.0–0.1)
Basophils Relative: 1 % (ref 0–1)
Eosinophils Relative: 6 % — ABNORMAL HIGH (ref 0–5)
HCT: 43.2 % (ref 39.0–52.0)
MCHC: 33.8 g/dL (ref 30.0–36.0)
MCV: 93.7 fL (ref 78.0–100.0)
Monocytes Absolute: 0.5 10*3/uL (ref 0.1–1.0)
Neutro Abs: 1.2 10*3/uL — ABNORMAL LOW (ref 1.7–7.7)
Platelets: 115 10*3/uL — ABNORMAL LOW (ref 150–400)
RDW: 13.7 % (ref 11.5–15.5)

## 2012-04-24 LAB — PROTIME-INR: INR: 1.07 (ref <1.50)

## 2012-04-27 LAB — HEPATITIS C RNA QUANTITATIVE
HCV Quantitative Log: 6.56 {Log} — ABNORMAL HIGH (ref <1.18)
HCV Quantitative: 3601492 IU/mL — ABNORMAL HIGH (ref <15)

## 2012-04-27 NOTE — Progress Notes (Signed)
Faxed to Dr Gerda Diss

## 2012-04-27 NOTE — Progress Notes (Signed)
Quick Note:  Await all labs ZO:XWRUEA,V S, MD  ______

## 2012-04-28 LAB — HEPATITIS C GENOTYPE

## 2012-04-30 NOTE — Progress Notes (Signed)
Quick Note:  Results given to pt. Discussed w/ Dr Darrick Penna. He would like to come by to sign release for wife to have access to records. Please send referral to HCV clinic in [facility] Thanks ZO:XWRUEA,V S, MD  ______

## 2012-04-30 NOTE — Progress Notes (Signed)
Referral has been sent to Hep C clinic and they will call and set appointment date and time with patient

## 2012-11-03 ENCOUNTER — Other Ambulatory Visit: Payer: Self-pay | Admitting: Family Medicine

## 2012-12-30 ENCOUNTER — Encounter: Payer: Self-pay | Admitting: *Deleted

## 2013-01-02 ENCOUNTER — Other Ambulatory Visit: Payer: Self-pay | Admitting: Family Medicine

## 2013-01-04 NOTE — Telephone Encounter (Signed)
Last office visit 06/2012. Chart states increase hydrocodone to 3 tablets daily

## 2013-01-04 NOTE — Telephone Encounter (Signed)
Ok numb 90 no ref

## 2013-01-12 ENCOUNTER — Ambulatory Visit (INDEPENDENT_AMBULATORY_CARE_PROVIDER_SITE_OTHER): Payer: No Typology Code available for payment source | Admitting: Family Medicine

## 2013-01-12 ENCOUNTER — Telehealth: Payer: Self-pay | Admitting: Family Medicine

## 2013-01-12 ENCOUNTER — Encounter: Payer: Self-pay | Admitting: Family Medicine

## 2013-01-12 VITALS — BP 132/90 | HR 60 | Wt 155.4 lb

## 2013-01-12 DIAGNOSIS — J449 Chronic obstructive pulmonary disease, unspecified: Secondary | ICD-10-CM

## 2013-01-12 DIAGNOSIS — M549 Dorsalgia, unspecified: Secondary | ICD-10-CM

## 2013-01-12 DIAGNOSIS — R894 Abnormal immunological findings in specimens from other organs, systems and tissues: Secondary | ICD-10-CM

## 2013-01-12 DIAGNOSIS — G4700 Insomnia, unspecified: Secondary | ICD-10-CM

## 2013-01-12 DIAGNOSIS — R768 Other specified abnormal immunological findings in serum: Secondary | ICD-10-CM

## 2013-01-12 DIAGNOSIS — G8929 Other chronic pain: Secondary | ICD-10-CM | POA: Insufficient documentation

## 2013-01-12 MED ORDER — BUDESONIDE-FORMOTEROL FUMARATE 160-4.5 MCG/ACT IN AERO: 2.0000 | INHALATION_SPRAY | Freq: Two times a day (BID) | RESPIRATORY_TRACT | Status: DC

## 2013-01-12 MED ORDER — ALBUTEROL SULFATE HFA 108 (90 BASE) MCG/ACT IN AERS: 2.0000 | INHALATION_SPRAY | Freq: Four times a day (QID) | RESPIRATORY_TRACT | Status: DC | PRN

## 2013-01-12 NOTE — Telephone Encounter (Signed)
budesonide-formoterol (SYMBICORT) 160-4.5 MCG/ACT inhaler     albuterol (PROVENTIL HFA;VENTOLIN HFA) 108 (90 BASE) MCG/ACT inhaler   Please refill and send to [facility]

## 2013-01-12 NOTE — Progress Notes (Signed)
  Subjective:    Patient ID: Timothy Davis, male    DOB: [birth year], 52 y.o.   MRN: [id number]  Back Pain This is a chronic problem. The current episode started more than 1 year ago. The problem occurs 2 to 4 times per day. The problem has been waxing and waning since onset. The pain is present in the lumbar spine. The quality of the pain is described as aching. The pain does not radiate. The pain is at a severity of 5/10. The pain is moderate. The pain is worse during the day. Stiffness is present in the morning. Risk factors include lack of exercise.   Trouble sleeping is ongoing. Med helps some. States she definitely needs to stay on his Ambien.  Patient has a history of significant COPD. See prior notes. Claims use of medication regularly. Shortness of breath with any type of exertion. Some shortness of breath when outside in the heat.  Patient was identified as having hepatitis C. He was referred to a gastroenterologist. He shrugs today when I asked him what he is doing in this regard. On further history he went to see the specialist but is decided to do nothing at this time.   Review of Systems  Musculoskeletal: Positive for back pain.   no weight loss no weight gain no chest pain no abdominal pain ROS otherwise negative     Objective:   Physical Exam  Alert no acute distress. Vitals stable. Lungs diminished breath sounds diffusely no tachypnea heart regular in rhythm. Low back pain for percussion negative straight leg raise no jaundice abdomen nontender.      Assessment & Plan:  Impression 1 chronic back pain ongoing with need for regular narcotics. Disabling to patient. #2 COPD very significant patient claims compliance with medicines. #3 hepatitis C patient has decided at this time not to do anything about this. I encouraged him to get back with his GI doctor. #4 insomnia ongoing definitely needs meds. Plan diet and exercise discussed other plans as noted above. Meds refilled. Check  every 6 months. WSL

## 2013-01-12 NOTE — Telephone Encounter (Signed)
RX refills sent to pharmacy. Wife was notified.

## 2013-01-12 NOTE — Patient Instructions (Signed)
Take your medicine regularly and try to exercise regularly.

## 2013-05-03 ENCOUNTER — Telehealth: Payer: Self-pay | Admitting: Family Medicine

## 2013-05-03 NOTE — Telephone Encounter (Signed)
See chart, attached is a Mssg from [facility] via his zolpidem (AMBIEN) 10 MG tablet refill.

## 2013-05-09 NOTE — Telephone Encounter (Signed)
I think we are at he did this may do it if not.

## 2013-05-10 MED ORDER — ZOLPIDEM TARTRATE 10 MG PO TABS: 10.0000 mg | ORAL_TABLET | Freq: Every evening | ORAL | Status: DC | PRN

## 2013-05-10 NOTE — Telephone Encounter (Signed)
Wife notified that RX for Remus Loffler will [facility]. She verbalized understanding.

## 2013-07-13 ENCOUNTER — Other Ambulatory Visit: Payer: Self-pay | Admitting: *Deleted

## 2013-07-13 ENCOUNTER — Encounter: Payer: Self-pay | Admitting: Family Medicine

## 2013-07-13 ENCOUNTER — Ambulatory Visit (INDEPENDENT_AMBULATORY_CARE_PROVIDER_SITE_OTHER): Payer: No Typology Code available for payment source | Admitting: Family Medicine

## 2013-07-13 VITALS — BP 138/86 | Ht 72.0 in | Wt 155.4 lb

## 2013-07-13 DIAGNOSIS — R7689 Other specified abnormal immunological findings in serum: Secondary | ICD-10-CM

## 2013-07-13 DIAGNOSIS — E782 Mixed hyperlipidemia: Secondary | ICD-10-CM

## 2013-07-13 DIAGNOSIS — J449 Chronic obstructive pulmonary disease, unspecified: Secondary | ICD-10-CM | POA: Diagnosis not present

## 2013-07-13 DIAGNOSIS — R0602 Shortness of breath: Secondary | ICD-10-CM

## 2013-07-13 DIAGNOSIS — Z125 Encounter for screening for malignant neoplasm of prostate: Secondary | ICD-10-CM

## 2013-07-13 DIAGNOSIS — R894 Abnormal immunological findings in specimens from other organs, systems and tissues: Secondary | ICD-10-CM | POA: Diagnosis not present

## 2013-07-13 DIAGNOSIS — Z79899 Other long term (current) drug therapy: Secondary | ICD-10-CM | POA: Diagnosis not present

## 2013-07-13 DIAGNOSIS — Z23 Encounter for immunization: Secondary | ICD-10-CM

## 2013-07-13 DIAGNOSIS — G4700 Insomnia, unspecified: Secondary | ICD-10-CM

## 2013-07-13 DIAGNOSIS — R768 Other specified abnormal immunological findings in serum: Secondary | ICD-10-CM

## 2013-07-13 DIAGNOSIS — J4489 Other specified chronic obstructive pulmonary disease: Secondary | ICD-10-CM

## 2013-07-13 MED ORDER — HYDROCODONE-ACETAMINOPHEN 7.5-325 MG PO TABS: 1.0000 | ORAL_TABLET | Freq: Three times a day (TID) | ORAL | Status: DC

## 2013-07-13 MED ORDER — HYDROCODONE-ACETAMINOPHEN 7.5-325 MG/15ML PO SOLN: 10.0000 mL | Freq: Four times a day (QID) | ORAL | Status: DC | PRN

## 2013-07-13 MED ORDER — ZOLPIDEM TARTRATE 10 MG PO TABS: 10.0000 mg | ORAL_TABLET | Freq: Every evening | ORAL | Status: DC | PRN

## 2013-07-13 NOTE — Progress Notes (Signed)
   Subjective:    Patient ID: Timothy Davis, male    DOB: 1961/05/07, 52 y.o.   MRN: [id number]  HPI  Patient arrives for a follow up on COPD.use the symbicort regularly, takes ventolin also daily. Trying to walk, still quit on smoking.  Takes ambien, does not seem to help very much, often adds advil pm.  Back pain low mid back, when flaring up out the low back      back pain. Patient states the Vicodin is not helping the pain anymore. Feels like he needs a stronger dose of medication.  Patient has history of hepatitis C. I sent him to a gastroenterologist last year. He did not followup with the hepatitis C clinic in [place] as advised per Dr. Darrick Penna. We checked his blood work in the system. Indeed he had confirmatory test which showed true hepatitis C  Review of Systems No headache no chest pain ongoing back pain no abdominal pain no change in bowel habits    Objective:   Physical Exam  Alert mild malaise. HEENT normal. Lungs diminished breath sounds diffusely. No acute wheezes. Heart regular in rhythm. Low back tender to palpation. Abdominal exam benign. Plus minus straight leg raise      Assessment & Plan:  Impression #1 COPD ongoing challenges. Does stable at this time. #2 chronic severe low back pain now worsening discussed at length. #3 insomnia suboptimal, did not want to change that medication also. #4 hepatitis C patient asked almost as if he does realize he has. This despite numerous conversations with [place] and a referral to specialist. Multiple questions answered for both wife and patient plan 35 minutes spent easily with discussion. Increase hydrocodone 7.5 3 times a day. Consultation with hepatitis C specialist. Maintain other meds. Diet exercise discussed. recheck

## 2013-08-06 LAB — BASIC METABOLIC PANEL
BUN: 13 mg/dL (ref 6–23)
CO2: 32 mEq/L (ref 19–32)
Calcium: 9.3 mg/dL (ref 8.4–10.5)
Chloride: 101 mEq/L (ref 96–112)
Creat: 0.82 mg/dL (ref 0.50–1.35)
Glucose, Bld: 111 mg/dL — ABNORMAL HIGH (ref 70–99)
Potassium: 4.7 mEq/L (ref 3.5–5.3)
Sodium: 139 mEq/L (ref 135–145)

## 2013-08-06 LAB — LIPID PANEL
Cholesterol: 157 mg/dL (ref 0–200)
HDL: 45 mg/dL (ref >39)
LDL Cholesterol: 96 mg/dL (ref 0–99)
Total CHOL/HDL Ratio: 3.5 Ratio
Triglycerides: 82 mg/dL (ref <150)
VLDL: 16 mg/dL (ref 0–40)

## 2013-08-06 LAB — HEPATIC FUNCTION PANEL
ALT: 158 U/L — ABNORMAL HIGH (ref 0–53)
AST: 119 U/L — ABNORMAL HIGH (ref 0–37)
Albumin: 3.9 g/dL (ref 3.5–5.2)
Alkaline Phosphatase: 59 U/L (ref 39–117)
Bilirubin, Direct: 0.2 mg/dL (ref 0.0–0.3)
Indirect Bilirubin: 0.8 mg/dL (ref 0.0–0.9)
Total Bilirubin: 1 mg/dL (ref 0.3–1.2)
Total Protein: 7.2 g/dL (ref 6.0–8.3)

## 2013-08-06 LAB — MAGNESIUM: Magnesium: 1.8 mg/dL (ref 1.5–2.5)

## 2013-08-07 LAB — PSA: PSA: 0.39 ng/mL (ref <=4.00)

## 2013-08-09 ENCOUNTER — Telehealth: Payer: Self-pay | Admitting: Family Medicine

## 2013-08-09 MED ORDER — ZOLPIDEM TARTRATE 10 MG PO TABS: 10.0000 mg | ORAL_TABLET | Freq: Every evening | ORAL | Status: DC | PRN

## 2013-08-09 NOTE — Telephone Encounter (Signed)
Ok plus one ref 

## 2013-08-09 NOTE — Telephone Encounter (Signed)
Last office visit 07-13-13

## 2013-08-09 NOTE — Telephone Encounter (Signed)
Needs refill on Amibien, please send to [facility] for 90 day supply (this is how it was last filled), please call pt when done [phone]

## 2013-08-09 NOTE — Telephone Encounter (Signed)
Notified wife that script will be faxed today to pharmacy.

## 2013-08-13 ENCOUNTER — Telehealth: Payer: Self-pay | Admitting: Family Medicine

## 2013-08-13 NOTE — Telephone Encounter (Signed)
error 

## 2013-08-16 ENCOUNTER — Other Ambulatory Visit: Payer: Self-pay | Admitting: Family Medicine

## 2013-08-19 ENCOUNTER — Telehealth: Payer: Self-pay | Admitting: Family Medicine

## 2013-08-19 NOTE — Telephone Encounter (Signed)
Notified wife to contact pharmacy to see if medication has been mailed yet. Notified wife let him know all b w was great except elev of liv zymes which is coming from his hep c AND mild elevation of fasting sugar--try to cut sugars down in the diet. Tonia verbalized understanding.

## 2013-08-19 NOTE — Telephone Encounter (Signed)
Let pt know, may need short term locally, let him know all b w was great except elev of liv zymes which is coming forom his hep c AND mild elevation of fasting sugar--try to cut sugars down in the diet

## 2013-08-19 NOTE — Telephone Encounter (Signed)
This med was done 08/09/13

## 2013-08-19 NOTE — Telephone Encounter (Signed)
Patient needs Rx for Ambien sent to [facility]. Pharmacy has not delivered the medication yet.

## 2013-08-23 ENCOUNTER — Telehealth: Payer: Self-pay | Admitting: Family Medicine

## 2013-08-23 ENCOUNTER — Other Ambulatory Visit: Payer: Self-pay | Admitting: *Deleted

## 2013-08-23 MED ORDER — ZOLPIDEM TARTRATE 10 MG PO TABS: 10.0000 mg | ORAL_TABLET | Freq: Every evening | ORAL | Status: DC | PRN

## 2013-08-23 NOTE — Telephone Encounter (Signed)
Pt.notified

## 2013-08-23 NOTE — Telephone Encounter (Signed)
Ok thiry plus 6 ref

## 2013-08-23 NOTE — Telephone Encounter (Signed)
Patient needs Rx for Timothy Davis are not using mail order anymore because it is too difficult to deal with. Would like enough to last until appointment in March.  [place]

## 2013-08-25 ENCOUNTER — Other Ambulatory Visit: Payer: Self-pay | Admitting: Internal Medicine

## 2013-08-25 ENCOUNTER — Ambulatory Visit (INDEPENDENT_AMBULATORY_CARE_PROVIDER_SITE_OTHER): Payer: PRIVATE HEALTH INSURANCE | Admitting: Internal Medicine

## 2013-08-25 ENCOUNTER — Encounter: Payer: Self-pay | Admitting: Internal Medicine

## 2013-08-25 VITALS — BP 125/83 | HR 65 | Temp 98.1°F | Ht 72.0 in | Wt 155.0 lb

## 2013-08-25 DIAGNOSIS — B1920 Unspecified viral hepatitis C without hepatic coma: Secondary | ICD-10-CM

## 2013-08-25 LAB — PROTIME-INR
INR: 1.1 (ref <1.50)
Prothrombin Time: 14.1 seconds (ref 11.6–15.2)

## 2013-08-25 LAB — CBC WITH DIFFERENTIAL/PLATELET
Basophils Absolute: 0 10*3/uL (ref 0.0–0.1)
Basophils Relative: 0 % (ref 0–1)
EOS ABS: 0.1 10*3/uL (ref 0.0–0.7)
Eosinophils Relative: 3 % (ref 0–5)
HCT: 44.5 % (ref 39.0–52.0)
HEMOGLOBIN: 15.1 g/dL (ref 13.0–17.0)
Lymphocytes Relative: 43 % (ref 12–46)
Lymphs Abs: 1.3 10*3/uL (ref 0.7–4.0)
MCH: 31.5 pg (ref 26.0–34.0)
MCHC: 33.9 g/dL (ref 30.0–36.0)
MCV: 92.9 fL (ref 78.0–100.0)
MONOS PCT: 10 % (ref 3–12)
Monocytes Absolute: 0.3 10*3/uL (ref 0.1–1.0)
Neutro Abs: 1.3 10*3/uL — ABNORMAL LOW (ref 1.7–7.7)
Neutrophils Relative %: 44 % (ref 43–77)
Platelets: 123 10*3/uL — ABNORMAL LOW (ref 150–400)
RBC: 4.79 MIL/uL (ref 4.22–5.81)
RDW: 14 % (ref 11.5–15.5)
WBC: 3 10*3/uL — ABNORMAL LOW (ref 4.0–10.5)

## 2013-08-25 LAB — IRON: IRON: 166 ug/dL — AB (ref 42–165)

## 2013-08-25 LAB — HIV ANTIBODY (ROUTINE TESTING W REFLEX): HIV: NONREACTIVE

## 2013-08-25 NOTE — Progress Notes (Signed)
   Subjective:    Patient ID: VICK FILTER, male    DOB: 1961/01/07, 53 y.o.   MRN: [id number]  HPI Here for evaluation of hepatitis C.  Never treated and was diagnosed about 15 years ago during a blood transfusion.  His risk factors are snorting cocaine; never used IV drugs, no blood transfusion before 1990, never in the [employer].  Has genotype 1a, positive viral load and transaminitis.  Recent ultrasound of the liver was benign.  On chronic norco for back pain.  Rare alcohol use (once every few months).  Previous occasional alcohol.  No current drug use.  Interested in therapy.  Here with his wife.  Does not take extra tylenol in addition to [place].    Review of Systems  Constitutional: Negative for fever, appetite change, fatigue and unexpected weight change.  HENT: Negative for mouth sores.   Respiratory: Negative for cough and shortness of breath.   Cardiovascular: Negative for leg swelling.  Gastrointestinal: Negative for nausea, abdominal pain, diarrhea, constipation and abdominal distention.  Musculoskeletal: Positive for back pain.       Chronic  Skin: Negative for rash.  Neurological: Negative for dizziness, light-headedness and headaches.  Hematological: Negative for adenopathy.       Objective:   Physical Exam  Constitutional: He is oriented to person, place, and time. He appears well-developed and well-nourished. No distress.  HENT:  Mouth/Throat: No oropharyngeal exudate.  Eyes: Right eye exhibits no discharge. Left eye exhibits no discharge. No scleral icterus.  Cardiovascular: Normal rate, regular rhythm and normal heart sounds.  Exam reveals no friction rub.   No murmur heard. Pulmonary/Chest: Effort normal and breath sounds normal. No respiratory distress. He has no wheezes.  Abdominal: Soft. Bowel sounds are normal. He exhibits no distension and no mass. There is no tenderness. There is no rebound and no guarding.  No HSM  Musculoskeletal: He exhibits no edema.    Lymphadenopathy:    He has no cervical adenopathy.  Neurological: He is alert and oriented to person, place, and time.  Skin: Skin is dry. No rash noted. No erythema.  No PCT, no spider angioma  Psychiatric: He has a normal mood and affect.          Assessment & Plan:

## 2013-08-25 NOTE — Addendum Note (Signed)
Addended by: Dolan Amen D on: 08/25/2013 11:15 AM   Modules accepted: Orders

## 2013-08-25 NOTE — Assessment & Plan Note (Signed)
Genotype 1a.  Interested in treatment.  Will check FibroSURE/APRI.  No overt signs of cirrhosis.  Does have elevated transaminases.  Counseled on protecting liver, reason for treatment and methods of treatment.  Will check with insurance for coverage.  No concerning signs for active drug use.

## 2013-08-26 LAB — ANA: Anti Nuclear Antibody(ANA): NEGATIVE

## 2013-08-30 ENCOUNTER — Other Ambulatory Visit: Payer: Self-pay | Admitting: Internal Medicine

## 2013-08-30 DIAGNOSIS — B1920 Unspecified viral hepatitis C without hepatic coma: Secondary | ICD-10-CM

## 2013-08-30 LAB — HEPATITIS C VIRUS FIBROSURE
ALPHA-2-MACROGLOBULIN,QN(02): 480 mg/dL — AB (ref 110–276)
ALT: 186 IU/L — ABNORMAL HIGH (ref ?–55)
Apolipoprotein A-1: 139 mg/dL (ref 110–180)
BILIRUBIN, TOTAL(02): 0.4 mg/dL (ref 0.0–1.2)
FIBROSIS SCORE(01): 0.81 — AB (ref 0.00–0.21)
GGT: 115 IU/L — ABNORMAL HIGH (ref 0–60)
Haptoglobin: 67 mg/dL (ref 34–200)
NECROINFLAMM ACTVTY SCORE(01): 0.89 — AB (ref 0.00–0.17)

## 2013-08-31 ENCOUNTER — Other Ambulatory Visit: Payer: Self-pay | Admitting: Internal Medicine

## 2013-08-31 ENCOUNTER — Ambulatory Visit (HOSPITAL_COMMUNITY): Payer: PRIVATE HEALTH INSURANCE

## 2013-08-31 DIAGNOSIS — B1920 Unspecified viral hepatitis C without hepatic coma: Secondary | ICD-10-CM

## 2013-09-01 NOTE — Progress Notes (Signed)
Patient would have had to sign ABN due to insurance possibly not covering new ultrasound. Per Dr. Linus Salmons patient will have the regular abdominal ultrasound. This has been scheduled for Friday, 09/03/13 at 8:00 AM. Patient aware.

## 2013-09-03 ENCOUNTER — Ambulatory Visit (HOSPITAL_COMMUNITY)
Admission: RE | Admit: 2013-09-03 | Discharge: 2013-09-03 | Disposition: A | Discharge status: Home / Self Care | Payer: PRIVATE HEALTH INSURANCE | Source: Ambulatory Visit | Attending: Internal Medicine | Admitting: Internal Medicine

## 2013-09-03 ENCOUNTER — Ambulatory Visit (HOSPITAL_COMMUNITY): Payer: PRIVATE HEALTH INSURANCE

## 2013-09-03 DIAGNOSIS — B1920 Unspecified viral hepatitis C without hepatic coma: Secondary | ICD-10-CM | POA: Insufficient documentation

## 2013-09-15 ENCOUNTER — Other Ambulatory Visit: Payer: Self-pay | Admitting: Internal Medicine

## 2013-09-15 DIAGNOSIS — B1920 Unspecified viral hepatitis C without hepatic coma: Secondary | ICD-10-CM

## 2013-09-15 MED ORDER — LEDIPASVIR-SOFOSBUVIR 90-400 MG PO TABS: 1.0000 | ORAL_TABLET | Freq: Every day | ORAL | Status: DC

## 2013-09-21 ENCOUNTER — Ambulatory Visit (INDEPENDENT_AMBULATORY_CARE_PROVIDER_SITE_OTHER): Payer: PRIVATE HEALTH INSURANCE | Admitting: Internal Medicine

## 2013-09-21 ENCOUNTER — Encounter: Payer: Self-pay | Admitting: Internal Medicine

## 2013-09-21 VITALS — BP 129/85 | HR 63 | Temp 97.5°F | Ht 72.0 in | Wt 154.8 lb

## 2013-09-21 DIAGNOSIS — B1920 Unspecified viral hepatitis C without hepatic coma: Secondary | ICD-10-CM

## 2013-09-21 LAB — COMPLETE METABOLIC PANEL WITH GFR
ALK PHOS: 56 U/L (ref 39–117)
ALT: 92 U/L — ABNORMAL HIGH (ref 0–53)
AST: 81 U/L — ABNORMAL HIGH (ref 0–37)
Albumin: 4 g/dL (ref 3.5–5.2)
BUN: 15 mg/dL (ref 6–23)
CALCIUM: 9.4 mg/dL (ref 8.4–10.5)
CO2: 31 mEq/L (ref 19–32)
CREATININE: 0.69 mg/dL (ref 0.50–1.35)
Chloride: 101 mEq/L (ref 96–112)
GFR, Est African American: >89 mL/min
Glucose, Bld: 92 mg/dL (ref 70–99)
Potassium: 3.9 mEq/L (ref 3.5–5.3)
Sodium: 142 mEq/L (ref 135–145)
Total Bilirubin: 0.6 mg/dL (ref 0.2–1.2)
Total Protein: 6.8 g/dL (ref 6.0–8.3)

## 2013-09-21 LAB — HEPATITIS A ANTIBODY, TOTAL: Hep A Total Ab: NONREACTIVE

## 2013-09-21 LAB — HEPATITIS B CORE ANTIBODY, TOTAL: Hep B Core Total Ab: NONREACTIVE

## 2013-09-21 LAB — HEPATITIS B SURFACE ANTIBODY,QUALITATIVE: Hep B S Ab: NEGATIVE

## 2013-09-21 LAB — HEPATITIS B SURFACE ANTIGEN: HEP B S AG: NEGATIVE

## 2013-09-21 NOTE — Assessment & Plan Note (Signed)
Will get a current viral load, Hep A and B serology.  Will prescribe Harvoni for a projected 12 weeks.  Follow up with GI for ? Screening varices/cirrhosis.  RTC in April, sooner if he is able to start his meds.

## 2013-09-21 NOTE — Patient Instructions (Addendum)
Date 09/21/2013  Mr. Ransford As discussed in the [facility], once approved, your hepatitis C therapy will include the following medications:           Harvoni 90mg /400mg  tablet:           Take 1 tablet by mouth once daily   Please note that ALL MEDICATIONS WILL START ON THE SAME DATE for a total of 12 weeks. ---------------------------------------------------------------- Your HCV Treatment Start Date: ______TBA_________   Your HCV genotype:  1a    Liver Fibrosis:    F4 ---------------------------------------------------------------- YOUR PHARMACY CONTACT:   [place] [facility] and [place] Phone: [phone] Hours: Monday to Friday 7:30 am to 6:00 pm   Please always contact your pharmacy at least 3-4 business days before you run out of medications to ensure your next month's medication is ready or 1 week prior to running out if you receive it by mail.  Remember, each prescription is for 28 days. ---------------------------------------------------------------- GENERAL NOTES REGARDING YOUR HEPATITIS C MEDICATIONS:   SOFOSBUVIR/LEDIPASVIR (HARVONI): - Harvoni tablet is taken daily with OR without food. - The tablets are orange. - The tablets should be stored at room temperature.  - Acid reducing agents such as H2 blockers (ie. Pepcid (famotidine), Zantac (ranitidine), Tagamet (cimetidine), Axid (nizatidine) and proton pump inhibitors (ie. Prilosec (omeprazole), Protonix (pantoprazole), Nexium (esomeprazole), or Aciphex (rabeprazole)) can decrease effectiveness of Harvoni. Do not take until you have discussed with a health care provider.    -Antacids that contain magnesium and/or aluminum hydroxide (ie. Milk of Magensia, Rolaids, Gaviscon, Maalox, Mylanta, an dArthritis Pain Formula)can reduce absorption of Harvoni, so take them at least 4 hours before or after Harvoni.  -Calcium carbonate (calcium supplements or  antacids such as Tums, Caltrate, Os-Cal)needs to be taken at least 4 hours hours before or after Harvoni.  -St. John's wort or any products that contain St. John's wort like some herbal supplements  Please inform the office prior to starting any of these medications.  - The common side effects with Harvoni:      1. Fatigue      2. Headache      3. Nausea      4. Diarrhea      5. Insomnia  Please note that this only lists the most common side effects and is NOT a comprehensive list of the potential side effects of these medications. For more information, please review the drug information sheets that come with your medication package from the pharmacy.  ---------------------------------------------------------------- GENERAL HELPFUL HINTS ON HCV THERAPY: 1. No alcohol. 2. Protect against sun-sensitivity/sunburns (wear sunglasses, hat, long sleeves, pants and sunscreen). 3. Stay well-hydrated/well-moisturized. 4. Notify the [facility] of any changes in your other over-the-counter/herbal or prescription medications. 5. If you miss a dose of your medication, take the missed dose as soon as you remember. Return to your regular time/dose schedule the next day.  6.  Do not stop taking your medications without first talking with your healthcare provider. 7.  You may take Tylenol (acetaminophen), as long as the dose is less than 2000 mg (OR no more than 4 tablets of the Tylenol Extra Strengths 500mg  tablet) in 24 hours. 8.  You will need to obtain routine labs and/or office visits at [facility] at weeks 2, 4, 8,  and 12 as well as 12 and 24 weeks after completion of treatment.  If you are not compliant with labs and office visits, we may discontinue HCV  therapy.  Scharlene Gloss, [address] for [place], [place]  [phone]

## 2013-09-21 NOTE — Progress Notes (Signed)
   Subjective:    Patient ID: CLEATIS FANDRICH, male    DOB: August 15, 1960, 53 y.o.   MRN: [id number]  HPI Here for follow up of HCV.  Has genotype 1a. Viral load in 2013 positive over 3 million.  Elastrograpy and FibroSure both c/w Metavir F4.  No new issues.  Is scheduled to see GI in [place].  No acid reflux medicine.  No concerns.     Review of Systems  Constitutional: Negative for fever and fatigue.  Gastrointestinal: Negative for nausea and diarrhea.  Skin: Negative for rash.       Objective:   Physical Exam  Constitutional: He appears well-developed and well-nourished. No distress.  HENT:  Mouth/Throat: No oropharyngeal exudate.  Eyes: No scleral icterus.  Cardiovascular: Normal rate, regular rhythm and normal heart sounds.   No murmur heard. Pulmonary/Chest: Effort normal and breath sounds normal. No respiratory distress.  Lymphadenopathy:    He has no cervical adenopathy.  Skin: No rash noted.          Assessment & Plan:

## 2013-09-24 LAB — HEPATITIS C RNA QUANTITATIVE
HCV QUANT LOG: 6.51 {Log} — AB (ref <1.18)
HCV QUANT: 3222881 [IU]/mL — AB (ref <15)

## 2013-10-07 ENCOUNTER — Ambulatory Visit: Payer: No Typology Code available for payment source | Admitting: Family Medicine

## 2013-10-11 ENCOUNTER — Encounter: Payer: Self-pay | Admitting: Family Medicine

## 2013-10-11 ENCOUNTER — Ambulatory Visit (INDEPENDENT_AMBULATORY_CARE_PROVIDER_SITE_OTHER): Payer: No Typology Code available for payment source | Admitting: Family Medicine

## 2013-10-11 VITALS — BP 122/80 | Ht 72.0 in | Wt 156.0 lb

## 2013-10-11 DIAGNOSIS — G4700 Insomnia, unspecified: Secondary | ICD-10-CM | POA: Diagnosis not present

## 2013-10-11 DIAGNOSIS — M549 Dorsalgia, unspecified: Secondary | ICD-10-CM

## 2013-10-11 DIAGNOSIS — K2990 Gastroduodenitis, unspecified, without bleeding: Secondary | ICD-10-CM

## 2013-10-11 DIAGNOSIS — J449 Chronic obstructive pulmonary disease, unspecified: Secondary | ICD-10-CM | POA: Diagnosis not present

## 2013-10-11 DIAGNOSIS — G8929 Other chronic pain: Secondary | ICD-10-CM

## 2013-10-11 DIAGNOSIS — B1920 Unspecified viral hepatitis C without hepatic coma: Secondary | ICD-10-CM

## 2013-10-11 DIAGNOSIS — K2970 Gastritis, unspecified, without bleeding: Secondary | ICD-10-CM | POA: Insufficient documentation

## 2013-10-11 MED ORDER — HYDROCODONE-ACETAMINOPHEN 7.5-325 MG PO TABS: 1.0000 | ORAL_TABLET | Freq: Three times a day (TID) | ORAL | Status: DC

## 2013-10-11 MED ORDER — PANTOPRAZOLE SODIUM 40 MG PO TBEC
40.0000 mg | DELAYED_RELEASE_TABLET | Freq: Every day | ORAL | Status: DC
Start: 2013-10-11 — End: 2013-11-15

## 2013-10-11 NOTE — Progress Notes (Signed)
   Subjective:    Patient ID: Timothy Davis, male    DOB: [birth year], 53 y.o.   MRN: [id number]  HPI3 month check up.   Concerns about stomach pain. Feels better after eating or drinking milk. Started over 1 month ago. epigast discomfort usually around time of eating, food helps out. Can come back if doesn' eat.  No major hx of heartburn. Does not smoke. Does not drink alcohol. Does snuff and dip  Takes pain med reg due to severe chronic back pain. States she definitely needs pain medicine. Occasional back discomfort at times.    Cramps in hands and feet. Started over 1 year ago. Worse at night.can occur any old time.  Notes her breathing is fair no significant better not worse. Not really exercising. No major wheezing.  All by hepatitis C specialist. Has not started medication yet do to insurance challenges   Review of Systems No chest pain no loss of consciousness no blood in stool no vomiting no headache ROS otherwise negative    Objective:   Physical Exam  Alert mild malaise. Vitals reviewed. HEENT normal. Lungs diminished breath sounds diffusely. Heart regular in rhythm. Abdomen some epigastric tenderness. No rebound no guarding no masses no CVA tenderness. Plus minus straight leg raise. Positive low back pain to percussion.      Assessment & Plan:  Impression 1 COPD discussed #2 probable gastritis discussed #3 insomnia ongoing. #4 chronic pain ongoing. #5 hepatitis C discussed plan trial protonic [place]. Pain meds refilled. Diet exercise discussed in encourage. Recheck in 3 months. Sooner if abdominal pain does not resolve. WSL

## 2013-10-11 NOTE — Patient Instructions (Signed)

## 2013-10-19 ENCOUNTER — Other Ambulatory Visit: Payer: Self-pay | Admitting: Licensed Clinical Social Worker

## 2013-10-19 DIAGNOSIS — B182 Chronic viral hepatitis C: Secondary | ICD-10-CM

## 2013-10-19 MED ORDER — LEDIPASVIR-SOFOSBUVIR 90-400 MG PO TABS: 1.0000 | ORAL_TABLET | Freq: Every day | ORAL | Status: DC

## 2013-11-15 ENCOUNTER — Encounter: Payer: Self-pay | Admitting: Internal Medicine

## 2013-11-15 ENCOUNTER — Ambulatory Visit (INDEPENDENT_AMBULATORY_CARE_PROVIDER_SITE_OTHER): Payer: PRIVATE HEALTH INSURANCE | Admitting: Internal Medicine

## 2013-11-15 VITALS — BP 115/73 | HR 60 | Temp 98.5°F | Wt 155.0 lb

## 2013-11-15 DIAGNOSIS — Z23 Encounter for immunization: Secondary | ICD-10-CM | POA: Diagnosis not present

## 2013-11-15 DIAGNOSIS — B1920 Unspecified viral hepatitis C without hepatic coma: Secondary | ICD-10-CM | POA: Diagnosis not present

## 2013-11-15 LAB — CBC WITH DIFFERENTIAL/PLATELET
BASOS ABS: 0.1 10*3/uL (ref 0.0–0.1)
BASOS PCT: 1 % (ref 0–1)
EOS ABS: 0.3 10*3/uL (ref 0.0–0.7)
EOS PCT: 5 % (ref 0–5)
HCT: 41.4 % (ref 39.0–52.0)
Hemoglobin: 14.4 g/dL (ref 13.0–17.0)
Lymphocytes Relative: 40 % (ref 12–46)
Lymphs Abs: 2.2 10*3/uL (ref 0.7–4.0)
MCH: 31.5 pg (ref 26.0–34.0)
MCHC: 34.8 g/dL (ref 30.0–36.0)
MCV: 90.6 fL (ref 78.0–100.0)
Monocytes Absolute: 0.6 10*3/uL (ref 0.1–1.0)
Monocytes Relative: 11 % (ref 3–12)
NEUTROS PCT: 43 % (ref 43–77)
Neutro Abs: 2.4 10*3/uL (ref 1.7–7.7)
PLATELETS: 153 10*3/uL (ref 150–400)
RBC: 4.57 MIL/uL (ref 4.22–5.81)
RDW: 13.5 % (ref 11.5–15.5)
WBC: 5.5 10*3/uL (ref 4.0–10.5)

## 2013-11-15 NOTE — Progress Notes (Signed)
   Subjective:    Patient ID: Timothy Davis, male    DOB: October 10, 1960, 53 y.o.   MRN: [id number]  HPI  Here for follow up of HCV.  Has genotype 1a. Viral load in 2013 positive over 3 million.  Elastrograpy and FibroSure both c/w Metavir F4.  Started on Harvoni April 8th.  To do 12 weeks.  No issues.  Does not take Protonix and I have removed from list.  Some hoarseness but has some allergies.  Tolerating Harvoni well.    Review of Systems  Constitutional: Negative for fever and fatigue.  Gastrointestinal: Negative for nausea and diarrhea.  Skin: Negative for rash.       Objective:   Physical Exam  Constitutional: He appears well-developed and well-nourished. No distress.  Eyes: No scleral icterus.  Cardiovascular: Normal rate, regular rhythm and normal heart sounds.   No murmur heard. Pulmonary/Chest: Effort normal and breath sounds normal. No respiratory distress.  Lymphadenopathy:    He has no cervical adenopathy.  Skin: No rash noted.          Assessment & Plan:

## 2013-11-15 NOTE — Addendum Note (Signed)
Addended by: Reggy Eye on: 11/15/2013 03:10 PM   Modules accepted: Orders

## 2013-11-15 NOTE — Assessment & Plan Note (Signed)
Tolerating wwell.  Labs today, viral load and labs in 2 weeks.

## 2013-11-16 LAB — COMPLETE METABOLIC PANEL WITH GFR
ALBUMIN: 3.8 g/dL (ref 3.5–5.2)
ALT: 18 U/L (ref 0–53)
AST: 25 U/L (ref 0–37)
Alkaline Phosphatase: 48 U/L (ref 39–117)
BUN: 15 mg/dL (ref 6–23)
CALCIUM: 9.1 mg/dL (ref 8.4–10.5)
CHLORIDE: 102 meq/L (ref 96–112)
CO2: 31 meq/L (ref 19–32)
Creat: 0.75 mg/dL (ref 0.50–1.35)
GFR, Est African American: >89 mL/min
GFR, Est Non African American: >89 mL/min
Glucose, Bld: 82 mg/dL (ref 70–99)
POTASSIUM: 4.3 meq/L (ref 3.5–5.3)
Sodium: 139 mEq/L (ref 135–145)
Total Bilirubin: 0.5 mg/dL (ref 0.2–1.2)
Total Protein: 6.8 g/dL (ref 6.0–8.3)

## 2013-11-25 ENCOUNTER — Telehealth: Payer: Self-pay | Admitting: Family Medicine

## 2013-11-25 NOTE — Telephone Encounter (Signed)
Received prior auth request for pt's Symbicort, pt called to say that he's tried ADVAIR in the past with good results and only switched at that time due to insurance and is ok with going back on ADVAIR.  Please see front of paper chart, please advise.

## 2013-11-26 MED ORDER — FLUTICASONE-SALMETEROL 500-50 MCG/DOSE IN AEPB: 1.0000 | INHALATION_SPRAY | Freq: Two times a day (BID) | RESPIRATORY_TRACT | Status: DC

## 2013-11-26 NOTE — Telephone Encounter (Signed)
Discussed with patient. advair 500/50 one puff BID sent to [facility].

## 2013-11-26 NOTE — Telephone Encounter (Signed)
Ok switch back to advair

## 2013-12-02 ENCOUNTER — Encounter: Payer: Self-pay | Admitting: Internal Medicine

## 2013-12-02 ENCOUNTER — Ambulatory Visit (INDEPENDENT_AMBULATORY_CARE_PROVIDER_SITE_OTHER): Payer: PRIVATE HEALTH INSURANCE | Admitting: Internal Medicine

## 2013-12-02 VITALS — BP 131/76 | HR 60 | Temp 97.5°F | Wt 153.0 lb

## 2013-12-02 DIAGNOSIS — B1920 Unspecified viral hepatitis C without hepatic coma: Secondary | ICD-10-CM

## 2013-12-02 LAB — COMPLETE METABOLIC PANEL WITH GFR
ALK PHOS: 58 U/L (ref 39–117)
ALT: 15 U/L (ref 0–53)
AST: 27 U/L (ref 0–37)
Albumin: 3.7 g/dL (ref 3.5–5.2)
BILIRUBIN TOTAL: 0.3 mg/dL (ref 0.2–1.2)
BUN: 17 mg/dL (ref 6–23)
CHLORIDE: 101 meq/L (ref 96–112)
CO2: 31 mEq/L (ref 19–32)
CREATININE: 0.85 mg/dL (ref 0.50–1.35)
Calcium: 9.2 mg/dL (ref 8.4–10.5)
GFR, Est African American: >89 mL/min
GFR, Est Non African American: >89 mL/min
Glucose, Bld: 93 mg/dL (ref 70–99)
Potassium: 4 mEq/L (ref 3.5–5.3)
Sodium: 138 mEq/L (ref 135–145)
Total Protein: 6.6 g/dL (ref 6.0–8.3)

## 2013-12-02 LAB — CBC WITH DIFFERENTIAL/PLATELET
Basophils Absolute: 0 10*3/uL (ref 0.0–0.1)
Basophils Relative: 1 % (ref 0–1)
EOS PCT: 6 % — AB (ref 0–5)
Eosinophils Absolute: 0.2 10*3/uL (ref 0.0–0.7)
HCT: 41.8 % (ref 39.0–52.0)
HEMOGLOBIN: 14.3 g/dL (ref 13.0–17.0)
LYMPHS ABS: 1.3 10*3/uL (ref 0.7–4.0)
LYMPHS PCT: 39 % (ref 12–46)
MCH: 31.4 pg (ref 26.0–34.0)
MCHC: 34.2 g/dL (ref 30.0–36.0)
MCV: 91.7 fL (ref 78.0–100.0)
Monocytes Absolute: 0.3 10*3/uL (ref 0.1–1.0)
Monocytes Relative: 10 % (ref 3–12)
Neutro Abs: 1.5 10*3/uL — ABNORMAL LOW (ref 1.7–7.7)
Neutrophils Relative %: 44 % (ref 43–77)
PLATELETS: 153 10*3/uL (ref 150–400)
RBC: 4.56 MIL/uL (ref 4.22–5.81)
RDW: 13.2 % (ref 11.5–15.5)
WBC: 3.3 10*3/uL — AB (ref 4.0–10.5)

## 2013-12-02 NOTE — Progress Notes (Signed)
Patient ID: Timothy Davis, male   DOB: [birth year], 53 y.o.   MRN: [id number]   Subjective:    Patient ID: Timothy Davis, male    DOB: [birth year], 53 y.o.   MRN: [id number]  HPI Here for follow up of HCV.  Has genotype 1a. Viral load in 2013 positive over 3 million.  Elastrograpy and FibroSure both c/w Metavir F4.  Started on Harvoni April 8th.  To do 12 weeks.  No issues.  Occasional protonix and knows to take at the same time but did have some confusion on taking it so now will not take protonix until he has completed therapy.   Tolerating Harvoni well.    Review of Systems  Constitutional: Negative for fever and fatigue.  Gastrointestinal: Negative for nausea and diarrhea.  Skin: Negative for rash.       Objective:   Physical Exam  Constitutional: He appears well-developed and well-nourished. No distress.  Eyes: No scleral icterus.  Cardiovascular: Normal rate, regular rhythm and normal heart sounds.   No murmur heard. Pulmonary/Chest: Effort normal and breath sounds normal. No respiratory distress.  Lymphadenopathy:    He has no cervical adenopathy.  Skin: No rash noted.          Assessment & Plan:

## 2013-12-02 NOTE — Assessment & Plan Note (Addendum)
Tolerating well.  I will check viral load today to assure good compliance and effectiveness now at 4 weeks.  Also CMP CBC.  RTC 4 weeks.  Will do cmp cbc then as well then viral load at end of treatment (12 weeks).  Will need hepatitis B #2 next time.

## 2013-12-02 NOTE — Addendum Note (Signed)
Addended by: Dolan Amen D on: 12/02/2013 09:01 AM   Modules accepted: Orders

## 2013-12-03 LAB — HEPATITIS C RNA QUANTITATIVE: HCV Quantitative: NOT DETECTED IU/mL (ref <15)

## 2013-12-30 ENCOUNTER — Ambulatory Visit (INDEPENDENT_AMBULATORY_CARE_PROVIDER_SITE_OTHER): Payer: PRIVATE HEALTH INSURANCE | Admitting: Internal Medicine

## 2013-12-30 ENCOUNTER — Encounter: Payer: Self-pay | Admitting: Internal Medicine

## 2013-12-30 VITALS — BP 112/75 | HR 63 | Temp 97.7°F | Ht 71.0 in | Wt 154.0 lb

## 2013-12-30 DIAGNOSIS — Z23 Encounter for immunization: Secondary | ICD-10-CM | POA: Diagnosis not present

## 2013-12-30 DIAGNOSIS — B1920 Unspecified viral hepatitis C without hepatic coma: Secondary | ICD-10-CM | POA: Diagnosis not present

## 2013-12-30 LAB — CBC WITH DIFFERENTIAL/PLATELET
Basophils Absolute: 0 10*3/uL (ref 0.0–0.1)
Basophils Relative: 1 % (ref 0–1)
Eosinophils Absolute: 0.2 10*3/uL (ref 0.0–0.7)
Eosinophils Relative: 6 % — ABNORMAL HIGH (ref 0–5)
HCT: 45.8 % (ref 39.0–52.0)
Hemoglobin: 15.4 g/dL (ref 13.0–17.0)
Lymphocytes Relative: 45 % (ref 12–46)
Lymphs Abs: 1.8 10*3/uL (ref 0.7–4.0)
MCH: 31.3 pg (ref 26.0–34.0)
MCHC: 33.6 g/dL (ref 30.0–36.0)
MCV: 93.1 fL (ref 78.0–100.0)
Monocytes Absolute: 0.5 10*3/uL (ref 0.1–1.0)
Monocytes Relative: 11 % (ref 3–12)
Neutro Abs: 1.5 10*3/uL — ABNORMAL LOW (ref 1.7–7.7)
Neutrophils Relative %: 37 % — ABNORMAL LOW (ref 43–77)
PLATELETS: 157 10*3/uL (ref 150–400)
RBC: 4.92 MIL/uL (ref 4.22–5.81)
RDW: 13.5 % (ref 11.5–15.5)
WBC: 4.1 10*3/uL (ref 4.0–10.5)

## 2013-12-30 LAB — COMPLETE METABOLIC PANEL WITH GFR
ALT: 16 U/L (ref 0–53)
AST: 29 U/L (ref 0–37)
Albumin: 4 g/dL (ref 3.5–5.2)
Alkaline Phosphatase: 42 U/L (ref 39–117)
BUN: 15 mg/dL (ref 6–23)
CO2: 30 meq/L (ref 19–32)
Calcium: 9.6 mg/dL (ref 8.4–10.5)
Chloride: 100 mEq/L (ref 96–112)
Creat: 0.91 mg/dL (ref 0.50–1.35)
GFR, Est Non African American: >89 mL/min
Glucose, Bld: 90 mg/dL (ref 70–99)
Potassium: 4.6 mEq/L (ref 3.5–5.3)
Sodium: 138 mEq/L (ref 135–145)
TOTAL PROTEIN: 7.1 g/dL (ref 6.0–8.3)
Total Bilirubin: 0.8 mg/dL (ref 0.2–1.2)

## 2013-12-30 NOTE — Assessment & Plan Note (Signed)
LFTs today and rtc 4 weeks at end of therapy.  Will check RNA then.

## 2013-12-30 NOTE — Progress Notes (Signed)
Patient ID: Timothy Davis, male   DOB: 04/04/1961, 53 y.o.   MRN: [id number]   Subjective:    Patient ID: Timothy Davis, male    DOB: 1960/08/26, 53 y.o.   MRN: [id number]  HPI  Here for follow up of HCV.  Has genotype 1a. Viral load in 2013 positive over 3 million.  Elastrograpy and FibroSure both c/w Metavir F4.  Started on Harvoni April 8th.  To do 12 weeks.  Some hoarseness now.  Occasional protonix and knows to take at the same time.Sherald Hess Harvoni well.  Started last bottle last night.     Review of Systems  Constitutional: Negative for fever and fatigue.  Gastrointestinal: Negative for nausea and diarrhea.  Skin: Negative for rash.       Objective:   Physical Exam  Constitutional: He appears well-developed and well-nourished. No distress.  Eyes: No scleral icterus.  Cardiovascular: Normal rate, regular rhythm and normal heart sounds.   No murmur heard. Pulmonary/Chest: Effort normal and breath sounds normal. No respiratory distress.  Lymphadenopathy:    He has no cervical adenopathy.  Skin: No rash noted.          Assessment & Plan:

## 2014-01-10 ENCOUNTER — Ambulatory Visit (INDEPENDENT_AMBULATORY_CARE_PROVIDER_SITE_OTHER): Payer: No Typology Code available for payment source | Admitting: Family Medicine

## 2014-01-10 ENCOUNTER — Encounter: Payer: Self-pay | Admitting: Family Medicine

## 2014-01-10 VITALS — BP 124/84 | Ht 72.0 in | Wt 157.0 lb

## 2014-01-10 DIAGNOSIS — J449 Chronic obstructive pulmonary disease, unspecified: Secondary | ICD-10-CM

## 2014-01-10 DIAGNOSIS — M549 Dorsalgia, unspecified: Secondary | ICD-10-CM

## 2014-01-10 DIAGNOSIS — B1920 Unspecified viral hepatitis C without hepatic coma: Secondary | ICD-10-CM

## 2014-01-10 DIAGNOSIS — G8929 Other chronic pain: Secondary | ICD-10-CM

## 2014-01-10 MED ORDER — HYDROCODONE-ACETAMINOPHEN 7.5-325 MG PO TABS: 1.0000 | ORAL_TABLET | Freq: Three times a day (TID) | ORAL | Status: DC

## 2014-01-10 NOTE — Progress Notes (Signed)
   Subjective:    Patient ID: Timothy Davis, male    DOB: 10-Mar-1961, 53 y.o.   MRN: [id number]  HPI This patient was seen today for chronic pain  The medication list was reviewed and updated.   -Compliance with pain medication: YES  The patient was advised the importance of maintaining medication and not using illegal substances with these.  Refills needed: YES  The patient was educated that we can provide 3 monthly scripts for their medication, it is their responsibility to follow the instructions.  Side effects or complications from medications:  no  Patient is aware that pain medications are meant to minimize the severity of the pain to allow their pain levels to improve to allow for better function. They are aware of that pain medications cannot totally remove their pain.  Due for UDT ( at least once per year) : future  Patient also here to discuss his COPD. The Symbicort helped a lot. Now his insurance is not paying for it. Causing $190 per month. Family states cannot afford that.  Now under therapy for hepatitis C. Handling it well. No obvious side effects.  No concerns  Needs a refill on Ambien as well. States overall helps his sleep.       Review of Systems Ongoing back pain no chest pain no abdominal pain no change in bowel habits no rash no nocturia ROS otherwise negative    Objective:   Physical Exam  Alert no acute distress H&T normal neck supple. Lungs diminished breath sounds bilaterally heart regular rate and rhythm low back tender to percussion plus minus straight leg raise bilateral. Ankles without edema      Assessment & Plan:  Impression 1 chronic back pain discussed #2 insomnia ongoing. Discussed. #3 COPD severe. Insurance not covering effective treatment need to search for alternatives plan I spoke with pulmonary physician. We will go with a nebulizer alternative. Cover by Medicare parts be. Hydrocodone refilled. Diet exercise discussed. Recheck in 3  months as scheduled. WSL

## 2014-01-18 ENCOUNTER — Other Ambulatory Visit: Payer: Self-pay | Admitting: *Deleted

## 2014-01-18 MED ORDER — ALBUTEROL SULFATE HFA 108 (90 BASE) MCG/ACT IN AERS: 2.0000 | INHALATION_SPRAY | Freq: Four times a day (QID) | RESPIRATORY_TRACT | Status: DC | PRN

## 2014-01-31 ENCOUNTER — Encounter: Payer: Self-pay | Admitting: Internal Medicine

## 2014-01-31 ENCOUNTER — Ambulatory Visit (INDEPENDENT_AMBULATORY_CARE_PROVIDER_SITE_OTHER): Payer: PRIVATE HEALTH INSURANCE | Admitting: Internal Medicine

## 2014-01-31 VITALS — BP 145/75 | HR 56 | Temp 97.7°F | Wt 160.0 lb

## 2014-01-31 DIAGNOSIS — B182 Chronic viral hepatitis C: Secondary | ICD-10-CM | POA: Diagnosis not present

## 2014-01-31 LAB — CBC WITH DIFFERENTIAL/PLATELET
BASOS ABS: 0 10*3/uL (ref 0.0–0.1)
BASOS PCT: 1 % (ref 0–1)
Eosinophils Absolute: 0.2 10*3/uL (ref 0.0–0.7)
Eosinophils Relative: 5 % (ref 0–5)
HEMATOCRIT: 42.8 % (ref 39.0–52.0)
Hemoglobin: 14.8 g/dL (ref 13.0–17.0)
LYMPHS PCT: 38 % (ref 12–46)
Lymphs Abs: 1.2 10*3/uL (ref 0.7–4.0)
MCH: 31.4 pg (ref 26.0–34.0)
MCHC: 34.6 g/dL (ref 30.0–36.0)
MCV: 90.7 fL (ref 78.0–100.0)
Monocytes Absolute: 0.4 10*3/uL (ref 0.1–1.0)
Monocytes Relative: 13 % — ABNORMAL HIGH (ref 3–12)
NEUTROS PCT: 43 % (ref 43–77)
Neutro Abs: 1.3 10*3/uL — ABNORMAL LOW (ref 1.7–7.7)
Platelets: 145 10*3/uL — ABNORMAL LOW (ref 150–400)
RBC: 4.72 MIL/uL (ref 4.22–5.81)
RDW: 13.7 % (ref 11.5–15.5)
WBC: 3.1 10*3/uL — AB (ref 4.0–10.5)

## 2014-01-31 LAB — COMPLETE METABOLIC PANEL WITH GFR
ALK PHOS: 57 U/L (ref 39–117)
ALT: 18 U/L (ref 0–53)
AST: 29 U/L (ref 0–37)
Albumin: 4.2 g/dL (ref 3.5–5.2)
BUN: 15 mg/dL (ref 6–23)
CALCIUM: 9.2 mg/dL (ref 8.4–10.5)
CHLORIDE: 102 meq/L (ref 96–112)
CO2: 31 mEq/L (ref 19–32)
Creat: 0.86 mg/dL (ref 0.50–1.35)
GFR, Est African American: >89 mL/min
GFR, Est Non African American: >89 mL/min
Glucose, Bld: 42 mg/dL — CL (ref 70–99)
POTASSIUM: 4.7 meq/L (ref 3.5–5.3)
SODIUM: 140 meq/L (ref 135–145)
TOTAL PROTEIN: 6.7 g/dL (ref 6.0–8.3)
Total Bilirubin: 0.4 mg/dL (ref 0.2–1.2)

## 2014-01-31 NOTE — Assessment & Plan Note (Signed)
Labs today, 12 and 24 weeks to confirm cure.  RTC 3 months.

## 2014-01-31 NOTE — Progress Notes (Signed)
Patient ID: Timothy Davis, male   DOB: 07-21-1961, 53 y.o.   MRN: [id number]   Subjective:    Patient ID: Timothy Davis, male    DOB: [birth year], 53 y.o.   MRN: [id number]  HPI  Here for follow up of HCV.  Has genotype 1a. Viral load in 2013 positive over 3 million.  Elastrograpy and FibroSure both c/w Metavir F4.  Started on Harvoni April 8th.   Some hoarseness now.  Occasional protonix and knows to take at the same time..  Now finished 12 weeks.  Undetectable HCV RNA after 4 weeks.  No issues.   Labs today, repeat viral load at 12 and 24 weeks post treatment to confirm cure.    Review of Systems  Constitutional: Negative for fever and fatigue.  Gastrointestinal: Negative for nausea and diarrhea.  Skin: Negative for rash.       Objective:   Physical Exam  Constitutional: He appears well-developed and well-nourished. No distress.  Eyes: No scleral icterus.  Cardiovascular: Normal rate, regular rhythm and normal heart sounds.   No murmur heard. Pulmonary/Chest: Effort normal and breath sounds normal. No respiratory distress.  Lymphadenopathy:    He has no cervical adenopathy.  Skin: No rash noted.          Assessment & Plan:

## 2014-02-01 ENCOUNTER — Telehealth: Payer: Self-pay | Admitting: *Deleted

## 2014-02-01 LAB — HEPATITIS C RNA QUANTITATIVE: HCV QUANT: NOT DETECTED [IU]/mL (ref <15)

## 2014-02-01 NOTE — Telephone Encounter (Signed)
He was asymptomatic so just make sure he is eating

## 2014-02-01 NOTE — Telephone Encounter (Signed)
Patient called back and advised he feels fine and that his inhaler for his COPD can make his blood glucose lower. Patient advised he eats regularly and does not feel sluggish, light headed or any adverse symptoms. Advised him if he does he should eat and if does not feel better go to the ED.

## 2014-02-01 NOTE — Telephone Encounter (Signed)
Glucose critical low 42 collected 7/6 at 905am. Called patient, left message at home and cell numbers asking to please call back ASAP.  Will attempt again if I have not heard from the patient. Landis Gandy, RN

## 2014-02-02 ENCOUNTER — Telehealth: Payer: Self-pay | Admitting: *Deleted

## 2014-02-02 NOTE — Telephone Encounter (Signed)
Message copied by Ileana Roup on Wed Feb 02, 2014  2:02 PM ------      Message from: Thayer Headings      Created: Wed Feb 02, 2014 12:46 PM       Please let him know his Hepatitis C virus remains undetectable so things look great.  thanks ------

## 2014-02-02 NOTE — Telephone Encounter (Signed)
Pt verbalized understanding of Dr. Henreitta Leber message.

## 2014-02-21 ENCOUNTER — Other Ambulatory Visit: Payer: Self-pay | Admitting: Family Medicine

## 2014-02-21 ENCOUNTER — Telehealth: Payer: Self-pay | Admitting: Family Medicine

## 2014-02-21 MED ORDER — ZOLPIDEM TARTRATE 10 MG PO TABS: 10.0000 mg | ORAL_TABLET | Freq: Every evening | ORAL | Status: DC | PRN

## 2014-02-21 NOTE — Telephone Encounter (Signed)
Patient notified

## 2014-02-21 NOTE — Telephone Encounter (Signed)
zolpidem (AMBIEN) 10 MG tablet Pt needs renewal on this med, pharm would not  Send across the request according to pts spouse  Last filled 01/18/14  Last seen 01/31/14  [facility]

## 2014-02-21 NOTE — Telephone Encounter (Signed)
Ok 6 mo worth 

## 2014-02-22 NOTE — Telephone Encounter (Signed)
Timothy Davis

## 2014-04-05 ENCOUNTER — Encounter: Payer: Self-pay | Admitting: Family Medicine

## 2014-04-05 ENCOUNTER — Ambulatory Visit (INDEPENDENT_AMBULATORY_CARE_PROVIDER_SITE_OTHER): Payer: No Typology Code available for payment source | Admitting: Family Medicine

## 2014-04-05 VITALS — BP 132/80 | Ht 72.0 in | Wt 160.0 lb

## 2014-04-05 DIAGNOSIS — G4700 Insomnia, unspecified: Secondary | ICD-10-CM

## 2014-04-05 DIAGNOSIS — M549 Dorsalgia, unspecified: Secondary | ICD-10-CM

## 2014-04-05 DIAGNOSIS — G8929 Other chronic pain: Secondary | ICD-10-CM

## 2014-04-05 DIAGNOSIS — J449 Chronic obstructive pulmonary disease, unspecified: Secondary | ICD-10-CM | POA: Diagnosis not present

## 2014-04-05 MED ORDER — HYDROCODONE-ACETAMINOPHEN 7.5-325 MG PO TABS: 1.0000 | ORAL_TABLET | Freq: Three times a day (TID) | ORAL | Status: DC

## 2014-04-05 MED ORDER — TRIAMCINOLONE 0.1 % CREAM:EUCERIN CREAM 1:1: 1.0000 "application " | TOPICAL_CREAM | Freq: Two times a day (BID) | CUTANEOUS | Status: DC

## 2014-04-05 MED ORDER — TEMAZEPAM 30 MG PO CAPS: 30.0000 mg | ORAL_CAPSULE | Freq: Every evening | ORAL | Status: DC | PRN

## 2014-04-05 NOTE — Progress Notes (Signed)
   Subjective:    Patient ID: Timothy Davis, male    DOB: Jul 08, 1961, 53 y.o.   MRN: [id number]  HPI This patient was seen today for chronic pain  The medication list was reviewed and updated.   -Compliance with pain medication: yes The patient was advised the importance of maintaining medication and not using illegal substances with these.  Refills needed: yes  The patient was educated that we can provide 3 monthly scripts for their medication, it is their responsibility to follow the instructions.  Side effects or complications from medications: none  Patient is aware that pain medications are meant to minimize the severity of the pain to allow their pain levels to improve to allow for better function. They are aware of that pain medications cannot totally remove their pain.  Due for UDT ( at least once per year) :   COPD still in nature challenge. He cannot afford Symbicort or Advair. Albuterol helps only a little. 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 Discussed.  Not sleeping good at night. Taking zolpidem 10mg  every night. Wakes about 1 - 2 in the am and never comes back to sleep.     Sleeps only four to five hrs per night  Hep c no evidence of infxn at this time    Review of Systems No headache no chest pain no abdominal pain chronic back pain no change about habits no blood in stool    Objective:   Physical Exam Alert mild malaise. HEENT normal. Blood pressure good on repeat. Lungs diminished breath sounds diffusely heart rare rhythm. Low back tender to palpation.       Assessment & Plan:  Impression 1 insomnia suboptimal in discussed #2 COPD also suboptimum discuss. #3 chronic back pain plan change to Restoril each bedtime. Stop Ambien. Strongly encouraged to contact [place] for free meds. Exercise encourage. Pain medication refilled. With refills. Followup as scheduled. WSL

## 2014-04-07 ENCOUNTER — Ambulatory Visit: Payer: Medicare Other | Admitting: Family Medicine

## 2014-05-05 ENCOUNTER — Ambulatory Visit (INDEPENDENT_AMBULATORY_CARE_PROVIDER_SITE_OTHER): Payer: PRIVATE HEALTH INSURANCE | Admitting: Internal Medicine

## 2014-05-05 ENCOUNTER — Encounter: Payer: Self-pay | Admitting: Internal Medicine

## 2014-05-05 VITALS — BP 125/77 | HR 77 | Temp 97.6°F | Wt 160.0 lb

## 2014-05-05 DIAGNOSIS — Z23 Encounter for immunization: Secondary | ICD-10-CM

## 2014-05-05 DIAGNOSIS — K7460 Unspecified cirrhosis of liver: Secondary | ICD-10-CM | POA: Insufficient documentation

## 2014-05-05 DIAGNOSIS — B182 Chronic viral hepatitis C: Secondary | ICD-10-CM

## 2014-05-05 NOTE — Assessment & Plan Note (Signed)
Check HCV viral RNA and if negative, considered cured, though still check again in 3 months.  RTC 3 months for SVR24.

## 2014-05-05 NOTE — Progress Notes (Signed)
Patient ID: Timothy Davis, male   DOB: May 10, 1961, 53 y.o.   MRN: [id number]   Subjective:    Patient ID: Timothy Davis, male    DOB: [birth year], 53 y.o.   MRN: [id number]  HPI Here for follow up of HCV.  Has genotype 1a. Viral load in 2013 positive over 3 million.  Elastrograpy and FibroSure both c/w Metavir F4.  Started on Harvoni April 8th and finished 12 weeks in July.  Undetectable HCV RNA after 4 and 12 weeks. Here for SVR12 to confirm cure, no relapse.     Review of Systems  Constitutional: Negative for fever and fatigue.  Gastrointestinal: Negative for nausea and diarrhea.  Skin: Negative for rash.       Objective:   Physical Exam  Constitutional: He appears well-developed and well-nourished. No distress.  Eyes: No scleral icterus.  Cardiovascular: Normal rate, regular rhythm and normal heart sounds.   No murmur heard. Pulmonary/Chest: Effort normal and breath sounds normal. No respiratory distress.  Lymphadenopathy:    He has no cervical adenopathy.  Skin: No rash noted.          Assessment & Plan:

## 2014-05-05 NOTE — Assessment & Plan Note (Addendum)
Will need to continue with every 6 month limited ultrasound of the liver for hepatocellular carcinoma screening.  He can get that through [place] or his primary.  I will order at his next visit in 3 months.  Has had pneumovax.

## 2014-05-06 LAB — HEPATITIS C RNA QUANTITATIVE: HCV Quantitative: NOT DETECTED IU/mL (ref <15)

## 2014-06-30 ENCOUNTER — Encounter: Payer: Self-pay | Admitting: Family Medicine

## 2014-06-30 ENCOUNTER — Ambulatory Visit (INDEPENDENT_AMBULATORY_CARE_PROVIDER_SITE_OTHER): Payer: No Typology Code available for payment source | Admitting: Family Medicine

## 2014-06-30 VITALS — BP 118/70 | Ht 72.0 in | Wt 158.0 lb

## 2014-06-30 DIAGNOSIS — G8929 Other chronic pain: Secondary | ICD-10-CM

## 2014-06-30 DIAGNOSIS — G4700 Insomnia, unspecified: Secondary | ICD-10-CM

## 2014-06-30 DIAGNOSIS — M549 Dorsalgia, unspecified: Secondary | ICD-10-CM

## 2014-06-30 DIAGNOSIS — J449 Chronic obstructive pulmonary disease, unspecified: Secondary | ICD-10-CM

## 2014-06-30 MED ORDER — HYDROCODONE-ACETAMINOPHEN 7.5-325 MG PO TABS: 1.0000 | ORAL_TABLET | Freq: Three times a day (TID) | ORAL | Status: DC

## 2014-06-30 NOTE — Patient Instructions (Signed)

## 2014-06-30 NOTE — Progress Notes (Signed)
   Subjective:    Patient ID: Timothy Davis, male    DOB: [birth year], 53 y.o.   MRN: [id number]  HPI This patient was seen today for chronic pain  The medication list was reviewed and updated.   -Compliance with pain medication: Yes  The patient was advised the importance of maintaining medication and not using illegal substances with these.  Refills needed: Yes, on hydrocodone  The patient was educated that we can provide 3 monthly scripts for their medication, it is their responsibility to follow the instructions.  Side effects or complications from medications: No  Patient is aware that pain medications are meant to minimize the severity of the pain to allow their pain levels to improve to allow for better function. They are aware of that pain medications cannot totally remove their pain.  Due for UDT ( at least once per year) :   Using inhaler on occasion, still substantially short of breath. States Symbicort helped a lot but unable to afford. States unable to exercise because of chronic COPD shortness of breath.   States ongoing need for nightly Restoril. Definitely helps his sleep.  States definitely needs his pain medication.      Review of Systems No headache chronic back pain no chest pain no abdominal pain no change in bowel habits no blood in stool    Objective:   Physical Exam Alert vital stable. HEENT normal. Lungs diminished breath sounds diffusely rare wheeze no tachypnea. Heart regular in rhythm. Ankles without edema low back tender to percussion plus minus straight leg raise bilateral       Assessment & Plan:  Impression 1 chronic back pain #2 COPD moderate persistent unable to afford controlling meds #3 insomnia ongoing challenge plan medications refilled. Exercise encourage. Compliance discussed. Recheck in several months. WSL

## 2014-08-09 ENCOUNTER — Encounter: Payer: Self-pay | Admitting: Internal Medicine

## 2014-08-09 ENCOUNTER — Ambulatory Visit
Admission: RE | Admit: 2014-08-09 | Discharge: 2014-08-09 | Disposition: A | Discharge status: Home / Self Care | Payer: Medicare Other | Source: Ambulatory Visit | Attending: Internal Medicine | Admitting: Internal Medicine

## 2014-08-09 ENCOUNTER — Ambulatory Visit (INDEPENDENT_AMBULATORY_CARE_PROVIDER_SITE_OTHER): Payer: PRIVATE HEALTH INSURANCE | Admitting: Internal Medicine

## 2014-08-09 ENCOUNTER — Telehealth: Payer: Self-pay | Admitting: *Deleted

## 2014-08-09 VITALS — BP 144/85 | HR 66 | Temp 97.8°F | Wt 160.0 lb

## 2014-08-09 DIAGNOSIS — B182 Chronic viral hepatitis C: Secondary | ICD-10-CM | POA: Diagnosis not present

## 2014-08-09 DIAGNOSIS — K7460 Unspecified cirrhosis of liver: Secondary | ICD-10-CM

## 2014-08-09 NOTE — Progress Notes (Signed)
Patient ID: Timothy Davis, male   DOB: October 21, 1960, 54 y.o.   MRN: [id number]   Subjective:    Patient ID: Timothy Davis, male    DOB: 1960-11-11, 54 y.o.   MRN: [id number]  HPI Here for follow up of HCV and cirrhosis.  Has genotype 1a. Viral load in 2013 positive over 3 million.  Elastrograpy and FibroSure both c/w Metavir F4.  Completed therapy and SVR12 consistent with cure.  Needs HCC screening every 6 months.  Completed hepatitis A and B vaccine.  Had pneumovax in 2013.    Review of Systems  Constitutional: Negative for fever and fatigue.  Gastrointestinal: Negative for nausea and diarrhea.  Skin: Negative for rash.       Objective:   Physical Exam  Constitutional: He appears well-developed and well-nourished. No distress.  Eyes: No scleral icterus.  Cardiovascular: Normal rate, regular rhythm and normal heart sounds.   No murmur heard. Pulmonary/Chest: Effort normal and breath sounds normal. No respiratory distress.  Skin: No rash noted.          Assessment & Plan:

## 2014-08-09 NOTE — Telephone Encounter (Signed)
-----   Message from Thayer Headings, MD sent at 08/09/2014 11:38 AM EST ----- Please let him know his ultrasound looks ok, no new issues.  thanks

## 2014-08-09 NOTE — Telephone Encounter (Signed)
Relayed results.  Patient knows to call back for an appointment in 6 months. Landis Gandy, RN

## 2014-08-09 NOTE — Assessment & Plan Note (Signed)
Cured based on SVR 12.  No further HCV viral load testing indicated.  Will always be HCV antibody positive.

## 2014-08-09 NOTE — Assessment & Plan Note (Signed)
[place] screening today and every 6 months.  Will consider repeat elastography in 6-12 months since he has no stigmata of cirrhosis.

## 2014-08-27 ENCOUNTER — Other Ambulatory Visit: Payer: Self-pay | Admitting: Family Medicine

## 2014-09-26 ENCOUNTER — Encounter: Payer: Self-pay | Admitting: Family Medicine

## 2014-09-26 ENCOUNTER — Ambulatory Visit (INDEPENDENT_AMBULATORY_CARE_PROVIDER_SITE_OTHER): Payer: No Typology Code available for payment source | Admitting: Family Medicine

## 2014-09-26 VITALS — BP 118/82 | Ht 72.0 in

## 2014-09-26 DIAGNOSIS — G4700 Insomnia, unspecified: Secondary | ICD-10-CM

## 2014-09-26 DIAGNOSIS — M549 Dorsalgia, unspecified: Secondary | ICD-10-CM

## 2014-09-26 DIAGNOSIS — J449 Chronic obstructive pulmonary disease, unspecified: Secondary | ICD-10-CM

## 2014-09-26 DIAGNOSIS — G8929 Other chronic pain: Secondary | ICD-10-CM

## 2014-09-26 MED ORDER — HYDROCODONE-ACETAMINOPHEN 7.5-325 MG PO TABS: 1.0000 | ORAL_TABLET | Freq: Three times a day (TID) | ORAL | Status: DC

## 2014-09-26 MED ORDER — TEMAZEPAM 30 MG PO CAPS: 30.0000 mg | ORAL_CAPSULE | Freq: Every evening | ORAL | Status: DC | PRN

## 2014-09-26 NOTE — Progress Notes (Signed)
   Subjective:    Patient ID: Timothy Davis, male    DOB: 03/10/1961, 54 y.o.   MRN: [id number]  HPI This patient was seen today for chronic pain  The medication list was reviewed and updated.   -Compliance with pain medication: yes  The patient was advised the importance of maintaining medication and not using illegal substances with these.  Refills needed: yes- last filled 09/19/14 for # 90  The patient was educated that we can provide 3 monthly scripts for their medication, it is their responsibility to follow the instructions.  Side effects or complications from medications: none  Patient is aware that pain medications are meant to minimize the severity of the pain to allow their pain levels to improve to allow for better function. They are aware of that pain medications cannot totally remove their pain.  Due for UDT ( at least once per year) :   Uses inhaler around three times per d  Walks daily,  Sleeping at night ok but not great'. States definitely needs sleeping medicine. Needs refill.  Unfortunately cannot afford Symbicort. It did help him. Now using inhaler several times per day.        Review of Systems No headache no chest pain ongoing back pain some shortness of breath with exertion no nausea ROS otherwise negative    Objective:   Physical Exam  Alert vitals stable HET normal lungs diminished breath sounds diffusely heart rare rhythm low back tender to percussion negative true straight leg raise. Abdomen benign      Assessment & Plan:  Impression 1 chronic back pain discussed definitely needs medicine for function #2 COPD unfortunately cannot handle cost of preventive agents. #3 insomnia ongoing and substantial plan diet discussed exercise discussed. All medicines refilled. Pain medicines refilled. Local measures discussed WSL

## 2014-09-26 NOTE — Patient Instructions (Signed)

## 2014-12-22 ENCOUNTER — Encounter: Payer: Self-pay | Admitting: Family Medicine

## 2014-12-22 ENCOUNTER — Ambulatory Visit (INDEPENDENT_AMBULATORY_CARE_PROVIDER_SITE_OTHER): Payer: No Typology Code available for payment source | Admitting: Family Medicine

## 2014-12-22 VITALS — BP 132/80 | Ht 72.0 in | Wt 167.0 lb

## 2014-12-22 DIAGNOSIS — G4700 Insomnia, unspecified: Secondary | ICD-10-CM

## 2014-12-22 DIAGNOSIS — G8929 Other chronic pain: Secondary | ICD-10-CM

## 2014-12-22 DIAGNOSIS — Z79899 Other long term (current) drug therapy: Secondary | ICD-10-CM | POA: Diagnosis not present

## 2014-12-22 DIAGNOSIS — J431 Panlobular emphysema: Secondary | ICD-10-CM | POA: Diagnosis not present

## 2014-12-22 DIAGNOSIS — M549 Dorsalgia, unspecified: Secondary | ICD-10-CM | POA: Diagnosis not present

## 2014-12-22 DIAGNOSIS — Z13220 Encounter for screening for lipoid disorders: Secondary | ICD-10-CM | POA: Diagnosis not present

## 2014-12-22 DIAGNOSIS — Z125 Encounter for screening for malignant neoplasm of prostate: Secondary | ICD-10-CM

## 2014-12-22 MED ORDER — TEMAZEPAM 30 MG PO CAPS: 30.0000 mg | ORAL_CAPSULE | Freq: Every evening | ORAL | Status: DC | PRN

## 2014-12-22 MED ORDER — HYDROCODONE-ACETAMINOPHEN 7.5-325 MG PO TABS: 1.0000 | ORAL_TABLET | Freq: Three times a day (TID) | ORAL | Status: DC

## 2014-12-22 MED ORDER — ALBUTEROL SULFATE HFA 108 (90 BASE) MCG/ACT IN AERS: INHALATION_SPRAY | RESPIRATORY_TRACT | Status: DC

## 2014-12-22 MED ORDER — HYDROCODONE-ACETAMINOPHEN 7.5-325 MG PO TABS
1.0000 | ORAL_TABLET | Freq: Three times a day (TID) | ORAL | Status: DC
Start: 2014-12-22 — End: 2015-03-27

## 2014-12-22 NOTE — Patient Instructions (Signed)

## 2014-12-22 NOTE — Progress Notes (Signed)
   Subjective:    Patient ID: Timothy Davis, male    DOB: 03-Apr-1961, 54 y.o.   MRN: [id number]  HPI This patient was seen today for chronic pain  The medication list was reviewed and updated.   -Compliance with pain medication: yes  The patient was advised the importance of maintaining medication and not using illegal substances with these.  Refills needed: yes  The patient was educated that we can provide 3 monthly scripts for their medication, it is their responsibility to follow the instructions.  Side effects or complications from medications: none  Patient is aware that pain medications are meant to minimize the severity of the pain to allow their pain levels to improve to allow for better function. They are aware of that pain medications cannot totally remove their pain.  Due for UDT ( at least once per year) : urine drug screen today  Needs refills on all meds. Temazepam, states definitely helped with sleep. Needs refills. No obvious side effects.   hydrocodone, definitely needs refill. Chronic back pain.  Still having difficulties with shortness of breath. Wheezing intermittently. Unable toe afford preventative inhalers. Not smoking any longer. and proair.    reathing not doing good, hot air not treating  Uses the proair when walkng the dog  Taking the pain med faithfully  Using at night helps sleep   Review of Systems No headache positive neck pain positive back pain. No chest pain shortness of breath with exertion    Objective:   Physical Exam  Alert vitals stable neck painful with rotation lungs diminished breath sounds diffusely heart regular rhythm abdomen benign low back positive tender to palpation negative straight leg raise      Assessment & Plan:  Impression 1 chronic back pain discussed #2 COPD severe discussed #3 insomnia ongoing severe discussed plan all medications refilled. New drug policy discussed. Urine drug screen. WSL

## 2014-12-23 DIAGNOSIS — Z13220 Encounter for screening for lipoid disorders: Secondary | ICD-10-CM | POA: Diagnosis not present

## 2014-12-23 DIAGNOSIS — Z79899 Other long term (current) drug therapy: Secondary | ICD-10-CM | POA: Diagnosis not present

## 2014-12-23 DIAGNOSIS — Z125 Encounter for screening for malignant neoplasm of prostate: Secondary | ICD-10-CM | POA: Diagnosis not present

## 2014-12-24 LAB — BASIC METABOLIC PANEL
BUN/Creatinine Ratio: 18 (ref 9–20)
BUN: 15 mg/dL (ref 6–24)
CO2: 27 mmol/L (ref 18–29)
Calcium: 9.1 mg/dL (ref 8.7–10.2)
Chloride: 99 mmol/L (ref 97–108)
Creatinine, Ser: 0.84 mg/dL (ref 0.76–1.27)
GFR calc Af Amer: 115 mL/min/{1.73_m2} (ref >59)
GFR, EST NON AFRICAN AMERICAN: 99 mL/min/{1.73_m2} (ref >59)
Glucose: 94 mg/dL (ref 65–99)
POTASSIUM: 4.6 mmol/L (ref 3.5–5.2)
SODIUM: 141 mmol/L (ref 134–144)

## 2014-12-24 LAB — PSA: Prostate Specific Ag, Serum: 0.4 ng/mL (ref 0.0–4.0)

## 2014-12-24 LAB — LIPID PANEL
CHOL/HDL RATIO: 4 ratio (ref 0.0–5.0)
Cholesterol, Total: 182 mg/dL (ref 100–199)
HDL: 46 mg/dL (ref >39)
LDL CALC: 117 mg/dL — AB (ref 0–99)
TRIGLYCERIDES: 95 mg/dL (ref 0–149)
VLDL CHOLESTEROL CAL: 19 mg/dL (ref 5–40)

## 2014-12-24 LAB — HEPATIC FUNCTION PANEL
ALBUMIN: 4.4 g/dL (ref 3.5–5.5)
ALT: 12 IU/L (ref 0–44)
AST: 26 IU/L (ref 0–40)
Alkaline Phosphatase: 61 IU/L (ref 39–117)
BILIRUBIN, DIRECT: 0.1 mg/dL (ref 0.00–0.40)
Bilirubin Total: 0.5 mg/dL (ref 0.0–1.2)
TOTAL PROTEIN: 7 g/dL (ref 6.0–8.5)

## 2014-12-26 ENCOUNTER — Encounter: Payer: Self-pay | Admitting: Family Medicine

## 2014-12-29 LAB — TOXASSURE SELECT 13 (MW), URINE: PDF: 0

## 2015-03-27 ENCOUNTER — Encounter: Payer: Self-pay | Admitting: Family Medicine

## 2015-03-27 ENCOUNTER — Ambulatory Visit (INDEPENDENT_AMBULATORY_CARE_PROVIDER_SITE_OTHER): Payer: No Typology Code available for payment source | Admitting: Family Medicine

## 2015-03-27 VITALS — BP 128/82 | Ht 72.0 in | Wt 166.0 lb

## 2015-03-27 DIAGNOSIS — M549 Dorsalgia, unspecified: Secondary | ICD-10-CM

## 2015-03-27 DIAGNOSIS — G4700 Insomnia, unspecified: Secondary | ICD-10-CM | POA: Diagnosis not present

## 2015-03-27 DIAGNOSIS — G8929 Other chronic pain: Secondary | ICD-10-CM | POA: Diagnosis not present

## 2015-03-27 DIAGNOSIS — J449 Chronic obstructive pulmonary disease, unspecified: Secondary | ICD-10-CM | POA: Diagnosis not present

## 2015-03-27 MED ORDER — HYDROCODONE-ACETAMINOPHEN 7.5-325 MG PO TABS: 1.0000 | ORAL_TABLET | Freq: Three times a day (TID) | ORAL | Status: DC

## 2015-03-27 MED ORDER — ZOLPIDEM TARTRATE 10 MG PO TABS: 10.0000 mg | ORAL_TABLET | Freq: Every evening | ORAL | Status: DC | PRN

## 2015-03-27 NOTE — Progress Notes (Signed)
   Subjective:    Patient ID: Timothy Davis, male    DOB: Oct 05, 1960, 54 y.o.   MRN: [id number]  HPI This patient was seen today for chronic pain. Takes for low back pain. Med helps with the pain. Pain in the am is bad.   The medication list was reviewed and updated.   -Compliance with pain medication: takes as prescribed.   The patient was advised the importance of maintaining medication and not using illegal substances with these.  Refills needed: yes  The patient was educated that we can provide 3 monthly scripts for their medication, it is their responsibility to follow the instructions.  Side effects or complications from medications: none  Patient is aware that pain medications are meant to minimize the severity of the pain to allow their pain levels to improve to allow for better function. They are aware of that pain medications cannot totally remove their pain.  Due for UDT ( at least once per year) : done in may 2016  Was taking ambien and switched to temazepam because he was not sleeping well. Still only sleeping 3 -4 hours a night. Trouble falling asleep. Would prefer to go back to Ambien since that was less expensive.  Still having substantial difficulty with breathing. Wheezing at times. Short of breath with any kind of exertion. His walking couple times a day. Could not afford the maintenance medicines for COPD. In the process of getting back to [place] to see if qualifies for help      Review of Systems No headache no chest pain chronic low back pain minimal radiation no change in bowel habits no blood in stool    Objective:   Physical Exam Alert vitals stable. Blood pressure good on repeat lungs diminished diffuse breath sounds no wheezes no crackles no tachypnea heart regular in rhythm. Low back tender to percussion. Negative straight leg raise. Ankles without edema.       Assessment & Plan:  Impression 1 chronic low back pain ongoing discussed #2 COPD with  inability to afford controlled meds will [facility] qual[facility] #3 insomnia discussed plan back to Ambien for sleep. Maintain other medications. Diet exercise discussed. Recheck in several months. WSL

## 2015-04-10 ENCOUNTER — Encounter: Payer: Self-pay | Admitting: Internal Medicine

## 2015-04-10 ENCOUNTER — Telehealth: Payer: Self-pay | Admitting: *Deleted

## 2015-04-10 ENCOUNTER — Ambulatory Visit (INDEPENDENT_AMBULATORY_CARE_PROVIDER_SITE_OTHER): Payer: PRIVATE HEALTH INSURANCE | Admitting: Internal Medicine

## 2015-04-10 VITALS — BP 135/80 | HR 59 | Temp 98.1°F | Ht 72.0 in | Wt 161.0 lb

## 2015-04-10 DIAGNOSIS — K7460 Unspecified cirrhosis of liver: Secondary | ICD-10-CM | POA: Diagnosis not present

## 2015-04-10 DIAGNOSIS — Z23 Encounter for immunization: Secondary | ICD-10-CM | POA: Diagnosis not present

## 2015-04-10 LAB — COMPLETE METABOLIC PANEL WITH GFR
ALT: 11 U/L (ref 9–46)
AST: 24 U/L (ref 10–35)
Albumin: 4.4 g/dL (ref 3.6–5.1)
Alkaline Phosphatase: 46 U/L (ref 40–115)
BUN: 14 mg/dL (ref 7–25)
CO2: 27 mmol/L (ref 20–31)
Calcium: 9.8 mg/dL (ref 8.6–10.3)
Chloride: 99 mmol/L (ref 98–110)
Creat: 0.85 mg/dL (ref 0.70–1.33)
GFR, Est African American: >89 mL/min (ref >=60)
Glucose, Bld: 82 mg/dL (ref 65–99)
Potassium: 5.1 mmol/L (ref 3.5–5.3)
SODIUM: 138 mmol/L (ref 135–146)
TOTAL PROTEIN: 7.2 g/dL (ref 6.1–8.1)
Total Bilirubin: 0.6 mg/dL (ref 0.2–1.2)

## 2015-04-10 NOTE — Progress Notes (Signed)
Patient ID: CHAYSEN TILLMAN, male   DOB: 1960-09-16, 54 y.o.   MRN: [id number]   Subjective:    Patient ID: TYQUAN CARMICKLE, male    DOB: 1961-02-03, 54 y.o.   MRN: [id number]  HPI Here for follow up of cirrhosis.  Treated and considered cured for hepatitis C.  Elastrograpy and FibroSure both c/w Metavir F4.  Completed therapy and here for [facility] screening.  Completed hepatitis A and B vaccine.  Had pneumovax in 2013.    Review of Systems  Constitutional: Negative for fever and fatigue.  Gastrointestinal: Negative for nausea and diarrhea.  Skin: Negative for rash.       Objective:   Physical Exam  Constitutional: He appears well-developed and well-nourished. No distress.  Eyes: No scleral icterus.  Cardiovascular: Normal rate, regular rhythm and normal heart sounds.   No murmur heard. Pulmonary/Chest: Effort normal and breath sounds normal. No respiratory distress.  Skin: No rash noted.          Assessment & Plan:

## 2015-04-10 NOTE — Telephone Encounter (Signed)
Patient scheduled for abdominal ultrasound 9/23, 8:30 am Forestine Na (arrive 15 minutes early). Patient to be NPO from midnight.   Pt also scheduled for elastograpy in 6 months - 10/04/15 8:30 am Forestine Na (arrive 15 minutes early).  Patient to be NPO from midnight for this exam as well. Pt given appointment information and instructions, verbalized understanding and agreement.  Will give to [facility] Steps for prior authorization. Will fax written orders to [phone]. Landis Gandy, RN

## 2015-04-10 NOTE — Assessment & Plan Note (Signed)
Will check lfts and screenign ultrasound.  RtC 6 months unless concerns.

## 2015-04-14 ENCOUNTER — Telehealth: Payer: Self-pay | Admitting: *Deleted

## 2015-04-14 NOTE — Telephone Encounter (Signed)
Orders for abdominal ultrasound complete and abdominal ultrasound with elastography faxed to [facility] at [facility] [phone].  Studies are both scheduled (04/24/15 8:30 and 10/04/15 8:30). Patient aware. Per Val Steps, no prior authorization needed. Landis Gandy, RN

## 2015-04-24 ENCOUNTER — Ambulatory Visit (HOSPITAL_COMMUNITY)
Admission: RE | Admit: 2015-04-24 | Discharge: 2015-04-24 | Disposition: A | Discharge status: Home / Self Care | Payer: PRIVATE HEALTH INSURANCE | Source: Ambulatory Visit | Attending: Internal Medicine | Admitting: Internal Medicine

## 2015-04-24 ENCOUNTER — Telehealth: Payer: Self-pay | Admitting: *Deleted

## 2015-04-24 DIAGNOSIS — K7460 Unspecified cirrhosis of liver: Secondary | ICD-10-CM | POA: Diagnosis not present

## 2015-04-24 DIAGNOSIS — Z1289 Encounter for screening for malignant neoplasm of other sites: Secondary | ICD-10-CM | POA: Diagnosis not present

## 2015-04-24 DIAGNOSIS — C220 Liver cell carcinoma: Secondary | ICD-10-CM | POA: Diagnosis not present

## 2015-04-24 NOTE — Telephone Encounter (Signed)
-----   Message from Thayer Headings, MD sent at 04/24/2015 11:23 AM EDT ----- Please let him, or his wife know that his follow up ultrasound is stable, no concerns.  He ha elastography in March and I think his follow up can be scheduled after that now that the schedule is in. thanks

## 2015-04-24 NOTE — Telephone Encounter (Signed)
Patient notified and given appt information for March. Timothy Davis

## 2015-06-19 ENCOUNTER — Encounter: Payer: Self-pay | Admitting: Family Medicine

## 2015-06-19 ENCOUNTER — Ambulatory Visit (INDEPENDENT_AMBULATORY_CARE_PROVIDER_SITE_OTHER): Payer: PRIVATE HEALTH INSURANCE | Admitting: Family Medicine

## 2015-06-19 VITALS — BP 132/84 | Ht 72.0 in | Wt 164.0 lb

## 2015-06-19 DIAGNOSIS — G8929 Other chronic pain: Secondary | ICD-10-CM

## 2015-06-19 DIAGNOSIS — J449 Chronic obstructive pulmonary disease, unspecified: Secondary | ICD-10-CM | POA: Diagnosis not present

## 2015-06-19 DIAGNOSIS — M549 Dorsalgia, unspecified: Secondary | ICD-10-CM

## 2015-06-19 DIAGNOSIS — G4700 Insomnia, unspecified: Secondary | ICD-10-CM

## 2015-06-19 MED ORDER — HYDROCODONE-ACETAMINOPHEN 7.5-325 MG PO TABS: 1.0000 | ORAL_TABLET | Freq: Three times a day (TID) | ORAL | Status: DC

## 2015-06-19 MED ORDER — HYDROCODONE-ACETAMINOPHEN 7.5-325 MG PO TABS: 1.0000 | ORAL_TABLET | Freq: Four times a day (QID) | ORAL | Status: DC

## 2015-06-19 NOTE — Progress Notes (Signed)
   Subjective:  Patient arrives office with several distinct concerns.    Patient ID: Timothy Davis, male    DOB: 1961-03-26, 54 y.o.   MRN: [id number]  HPI This patient was seen today for chronic pain  The medication list was reviewed and updated.   -Compliance with medication: takes as prescribed  - Number patient states they take daily: one tid  -when was the last dose patient took? This am   The patient was advised the importance of maintaining medication and not using illegal substances with these.  Refills needed: yes  The patient was educated that we can provide 3 monthly scripts for their medication, it is their responsibility to follow the instructions.  Side effects or complications from medications: none  Patient is aware that pain medications are meant to minimize the severity of the pain to allow their pain levels to improve to allow for better function. They are aware of that pain medications cannot totally remove their pain.  Due for UDT ( at least once per year) : done in May 2016  Takes for back pain, states 3 times a day not helping.  Using inhaler three or four times per day . Went to the [place]. Went to social services. Qualifies for no help with Symbicort or Advair. Simply cannot afford it. Next  Ongoing substantial difficulties with insomnia. Compliant with meds. States helping.      Review of Systems No headache or chest pain no back pain no abdominal pain no change in bowel habits    Objective:   Physical Exam Alert vitals stable. HEENT normal. Lungsdiminished breath sounds diffusely but clear. Heart regular rate and rhythm.       Assessment & Plan:  Impression 1 chronic pain discuss treatment suboptimum #2COPD severe unable to afford controlling meds #3 insomnia ongoing in challenging plan 25 minutes spent most in discussion of these 3 challenges. Patient to continue albuterol which is suboptimal in. Increase pain medicine as discussed. Maintain  same insomnia medicine as discussed recheck in several months WSL

## 2015-06-26 ENCOUNTER — Ambulatory Visit: Payer: No Typology Code available for payment source | Admitting: Family Medicine

## 2015-08-31 ENCOUNTER — Other Ambulatory Visit: Payer: Self-pay | Admitting: Family Medicine

## 2015-09-19 ENCOUNTER — Encounter: Payer: Self-pay | Admitting: Family Medicine

## 2015-09-19 ENCOUNTER — Ambulatory Visit (INDEPENDENT_AMBULATORY_CARE_PROVIDER_SITE_OTHER): Payer: PRIVATE HEALTH INSURANCE | Admitting: Family Medicine

## 2015-09-19 VITALS — BP 140/90 | Ht 72.0 in | Wt 159.4 lb

## 2015-09-19 DIAGNOSIS — J449 Chronic obstructive pulmonary disease, unspecified: Secondary | ICD-10-CM | POA: Diagnosis not present

## 2015-09-19 DIAGNOSIS — G4700 Insomnia, unspecified: Secondary | ICD-10-CM

## 2015-09-19 DIAGNOSIS — M549 Dorsalgia, unspecified: Secondary | ICD-10-CM

## 2015-09-19 DIAGNOSIS — G8929 Other chronic pain: Secondary | ICD-10-CM

## 2015-09-19 MED ORDER — ALBUTEROL SULFATE HFA 108 (90 BASE) MCG/ACT IN AERS: INHALATION_SPRAY | RESPIRATORY_TRACT | Status: DC

## 2015-09-19 MED ORDER — HYDROCODONE-ACETAMINOPHEN 7.5-325 MG PO TABS: 1.0000 | ORAL_TABLET | Freq: Four times a day (QID) | ORAL | Status: DC

## 2015-09-19 MED ORDER — PREDNISONE 20 MG PO TABS: ORAL_TABLET | ORAL | Status: DC

## 2015-09-19 MED ORDER — AMOXICILLIN 500 MG PO CAPS: 500.0000 mg | ORAL_CAPSULE | Freq: Three times a day (TID) | ORAL | Status: DC

## 2015-09-19 NOTE — Progress Notes (Signed)
   Subjective:    Patient ID: Timothy Davis, male    DOB: 1960-09-07, 55 y.o.   MRN: [id number]  HPI This patient was seen today for chronic back pain  The medication list was reviewed and updated.   -Compliance with medication: yes  -when was the last dose patient took: this morning  The patient was advised the importance of maintaining medication and not using illegal substances with these.  Refills needed:yes  The patient was educated that we can provide 3 monthly scripts for their medication, it is their responsibility to follow the instructions.  Side effects or complications from medications: none  Patient is aware that pain medications are meant to minimize the severity of the pain to allow their pain levels to improve to allow for better function. They are aware of that pain medications cannot totally remove their pain.  Due for UDT ( at least once per year) : utd  Patient has no concerns at this time.    Patient still having substantial difficulties with his breathing. He cannot afford any of his steroid inhalers. Therefore uses only albuterol. This is insufficient. In the past was on Advair but simply cannot afford. Has experienced an acute flare was substantial cough and wheezing and productive yellowish phlegm no fever  Ongoing challenges with sleep and anxiety. Compliant with medications. Meds reviewed today. Does not miss any feels like he definitely needs to help him sleep. Next  Patient states he needs his pain medicine or acute function no obvious side effects during day  Review of Systems Positive back pain positive hip pain no headache no chest pain no abdominal pain no rash ROS otherwise    Objective:   Physical Exam  Alert vitals stable thin no acute distress blood pressure good on repeat HEENT normal lungs diminished breath sounds diffusely along with active wheezing heart regular rhythm ankles without edema      Assessment & Plan:  Impression 1  chronic pain discuss in need of meds meds refilled and reviewed #2 severe COPD. Unfortunately cannot take/04 chronic modified agents #3 insomnia ongoing discuss at some fatigue plan 25 minutes spent most in discussion pain meds refilled. Albuterol refilled. Due to flare steroids and antibiotics also prescribed today

## 2015-09-19 NOTE — Patient Instructions (Signed)

## 2015-10-04 ENCOUNTER — Other Ambulatory Visit: Payer: Self-pay | Admitting: Family Medicine

## 2015-10-04 ENCOUNTER — Ambulatory Visit (HOSPITAL_COMMUNITY)
Admission: RE | Admit: 2015-10-04 | Discharge: 2015-10-04 | Disposition: A | Discharge status: Home / Self Care | Payer: No Typology Code available for payment source | Source: Ambulatory Visit | Attending: Internal Medicine | Admitting: Internal Medicine

## 2015-10-04 DIAGNOSIS — K7460 Unspecified cirrhosis of liver: Secondary | ICD-10-CM | POA: Diagnosis not present

## 2015-10-04 DIAGNOSIS — B1920 Unspecified viral hepatitis C without hepatic coma: Secondary | ICD-10-CM | POA: Diagnosis present

## 2015-10-04 NOTE — Telephone Encounter (Signed)
This and 2 refills 

## 2015-10-04 NOTE — Telephone Encounter (Signed)
I have seen this request at least 3 separate times. Please somehow feel this with 2 refills and please make note not to send this to me again if it all possible thank you

## 2015-10-05 ENCOUNTER — Telehealth: Payer: Self-pay | Admitting: Family Medicine

## 2015-10-05 NOTE — Telephone Encounter (Signed)
Pt is needing a refill on his   zolpidem (AMBIEN) 10 MG tablet         [facility]

## 2015-10-05 NOTE — Telephone Encounter (Signed)
Rx faxed to pharmacy. Patient notified. 

## 2015-12-18 ENCOUNTER — Ambulatory Visit: Payer: PRIVATE HEALTH INSURANCE | Admitting: Family Medicine

## 2015-12-20 ENCOUNTER — Telehealth: Payer: Self-pay | Admitting: Family Medicine

## 2015-12-20 MED ORDER — HYDROCODONE-ACETAMINOPHEN 7.5-325 MG PO TABS: 1.0000 | ORAL_TABLET | Freq: Four times a day (QID) | ORAL | Status: DC

## 2015-12-20 NOTE — Telephone Encounter (Signed)
Patient notified script ready for pickup. Keep 3 month follow up visit.

## 2015-12-20 NOTE — Telephone Encounter (Signed)
Patient needs Rx for HYDROcodone-acetaminophen (NORCO) 7.5-325 MG tablet.  He is requesting this to be able to pick up today.

## 2015-12-20 NOTE — Telephone Encounter (Signed)
30 d ok needs to be sure in future to keep every 3 mo visit

## 2015-12-26 ENCOUNTER — Ambulatory Visit (INDEPENDENT_AMBULATORY_CARE_PROVIDER_SITE_OTHER): Payer: No Typology Code available for payment source | Admitting: Family Medicine

## 2015-12-26 ENCOUNTER — Encounter: Payer: Self-pay | Admitting: Family Medicine

## 2015-12-26 ENCOUNTER — Telehealth: Payer: Self-pay | Admitting: *Deleted

## 2015-12-26 VITALS — BP 128/82 | Ht 72.0 in | Wt 157.2 lb

## 2015-12-26 DIAGNOSIS — Z79891 Long term (current) use of opiate analgesic: Secondary | ICD-10-CM | POA: Diagnosis not present

## 2015-12-26 MED ORDER — HYDROCODONE-ACETAMINOPHEN 7.5-325 MG PO TABS: 1.0000 | ORAL_TABLET | Freq: Four times a day (QID) | ORAL | Status: DC

## 2015-12-26 MED ORDER — FLUTICASONE-SALMETEROL 250-50 MCG/DOSE IN AEPB: 1.0000 | INHALATION_SPRAY | Freq: Two times a day (BID) | RESPIRATORY_TRACT | Status: DC

## 2015-12-26 MED ORDER — ZOLPIDEM TARTRATE 10 MG PO TABS: ORAL_TABLET | ORAL | Status: DC

## 2015-12-26 MED ORDER — BUDESONIDE-FORMOTEROL FUMARATE 160-4.5 MCG/ACT IN AERO: 2.0000 | INHALATION_SPRAY | Freq: Two times a day (BID) | RESPIRATORY_TRACT | Status: DC

## 2015-12-26 NOTE — Telephone Encounter (Signed)
advair discus 250/50 one inhalation bid 6 ref

## 2015-12-26 NOTE — Telephone Encounter (Signed)
Pt seen today and prescribed symbicort. Insurance will not cover. Can it be changed to advair and if so need dose and directions. Can sent to [facility]. Pt wants to get today if possible.

## 2015-12-26 NOTE — Progress Notes (Signed)
   Subjective:    Patient ID: Timothy Davis, male    DOB: 08/22/1960, 55 y.o.   MRN: [id number]  HPI  This patient was seen today for chronic pain  The medication list was reviewed and updated.   -Compliance with medication: yes  - Number patient states they take daily: 4 per day  -when was the last dose patient took? This am 12/26/15  The patient was advised the importance of maintaining medication and not using illegal substances with these.  Refills needed: yes  The patient was educated that we can provide 3 monthly scripts for their medication, it is their responsibility to follow the instructions.  Side effects or complications from medications: none  Patient is aware that pain medications are meant to minimize the severity of the pain to allow their pain levels to improve to allow for better function. They are aware of that pain medications cannot totally remove their pain.  Due for UDT ( at least once per year) : today   patient still continues to have substantial trouble with his breathing. Has tried preventive agents in the past but his insurance company did not cover. Would like t  Ongoing challenges with insomnia. States he needs the Ambien each night in ordeget sufficien      Review of Systems No headache no chest pain positive chronic back pain positive chronic shortness of breath with exertion no abdominal pain    Objective:   Physical Exam Alert vitals table HEEN SLIGHT NASAL CONGESTION LUNGS DIMINISHED BREATH SOUNDS DIFFUSELY NO ACUTE WHEEZING HEART REGULAR RHYTHM LOW BACK TENDER TO PERCUSSIONNEGATIVE STRAIGHT RAISING  IMPRESSION #1 chronic pain ongoing challenges and need for medication. Timeto do urine drug screen discussed #2 insomnia with ongoing need for meds discussed #3 COPD time to reinitiate chronic maintenance medicine. Of note Symbicort was first prescribed and then cha All pain medications refilled. Diet exercise discussed recheck in several  monthswellness exam plus chronic visit then WSL       Assessment & Plan:

## 2015-12-31 ENCOUNTER — Encounter: Payer: Self-pay | Admitting: Family Medicine

## 2016-01-01 LAB — TOXASSURE SELECT 13 (MW), URINE

## 2016-03-19 ENCOUNTER — Ambulatory Visit (INDEPENDENT_AMBULATORY_CARE_PROVIDER_SITE_OTHER): Payer: No Typology Code available for payment source | Admitting: Family Medicine

## 2016-03-19 ENCOUNTER — Encounter: Payer: Self-pay | Admitting: Family Medicine

## 2016-03-19 VITALS — BP 122/82 | Ht 72.0 in | Wt 154.4 lb

## 2016-03-19 DIAGNOSIS — Z79891 Long term (current) use of opiate analgesic: Secondary | ICD-10-CM | POA: Diagnosis not present

## 2016-03-19 DIAGNOSIS — Z0000 Encounter for general adult medical examination without abnormal findings: Secondary | ICD-10-CM

## 2016-03-19 DIAGNOSIS — Z0189 Encounter for other specified special examinations: Secondary | ICD-10-CM

## 2016-03-19 DIAGNOSIS — Z79899 Other long term (current) drug therapy: Secondary | ICD-10-CM | POA: Diagnosis not present

## 2016-03-19 DIAGNOSIS — Z125 Encounter for screening for malignant neoplasm of prostate: Secondary | ICD-10-CM

## 2016-03-19 DIAGNOSIS — J431 Panlobular emphysema: Secondary | ICD-10-CM

## 2016-03-19 DIAGNOSIS — E785 Hyperlipidemia, unspecified: Secondary | ICD-10-CM

## 2016-03-19 MED ORDER — HYDROCODONE-ACETAMINOPHEN 7.5-325 MG PO TABS: 1.0000 | ORAL_TABLET | Freq: Four times a day (QID) | ORAL | 0 refills | Status: DC

## 2016-03-19 MED ORDER — ZOLPIDEM TARTRATE 10 MG PO TABS: ORAL_TABLET | ORAL | 5 refills | Status: DC

## 2016-03-19 NOTE — Progress Notes (Signed)
   Subjective:    Patient ID: Timothy Davis, male    DOB: April 25, 1961, 55 y.o.   MRN: [id number]  HPI The patient comes in today for a wellness visit. Exercise not so much  Using the advIR, has helped some, but not a lot      A review of their health history was completed.  A review of medications was also completed.  Any needed refills; Patient needs refills of hydrocodone-UDT today  Eating habits: trying to eat good  Falls/  MVA accidents in past few months: none  Regular exercise: walk dogs every day  Specialist pt sees on regular basis: pulmonologist  Preventative health issues were discussed.   Additional concerns: none  Colon pos tub ademnoma, next colon scope fall of 2018  Review of Systems  Constitutional: Negative for activity change, appetite change and fever.  HENT: Negative for congestion and rhinorrhea.   Eyes: Negative for discharge.  Respiratory: Negative for cough and wheezing.   Cardiovascular: Negative for chest pain.  Gastrointestinal: Negative for abdominal pain, blood in stool and vomiting.  Genitourinary: Negative for difficulty urinating and frequency.  Musculoskeletal: Negative for neck pain.  Skin: Negative for rash.  Allergic/Immunologic: Negative for environmental allergies and food allergies.  Neurological: Negative for weakness and headaches.  Psychiatric/Behavioral: Negative for agitation.  All other systems reviewed and are negative.      Objective:   Physical Exam  Constitutional: He appears well-developed and well-nourished.  HENT:  Head: Normocephalic and atraumatic.  Right Ear: External ear normal.  Left Ear: External ear normal.  Nose: Nose normal.  Mouth/Throat: Oropharynx is clear and moist.  Eyes: EOM are normal. Pupils are equal, round, and reactive to light.  Neck: Normal range of motion. Neck supple. No thyromegaly present.  Cardiovascular: Normal rate, regular rhythm and normal heart sounds.   No murmur  heard. Pulmonary/Chest: Effort normal and breath sounds normal. No respiratory distress. He has no wheezes.  Diminished breath sounds diffusely  Abdominal: Soft. Bowel sounds are normal. He exhibits no distension and no mass. There is no tenderness.  Genitourinary: Penis normal.  Musculoskeletal: Normal range of motion. He exhibits no edema.  Lymphadenopathy:    He has no cervical adenopathy.  Neurological: He is alert. He exhibits normal muscle tone.  Skin: Skin is warm and dry. No erythema.  Psychiatric: He has a normal mood and affect. His behavior is normal. Judgment normal.  Vitals reviewed.  Positive low back pain to percussion       Assessment & Plan:  Impression 1 wellness exam #21 COPD ongoing #3 chronic pain ongoing need for medications plan diet exercise discussed. Appropriate blood work. Pain medicines written. Patient to maintain Advair

## 2016-03-21 ENCOUNTER — Other Ambulatory Visit: Payer: Self-pay | Admitting: Family Medicine

## 2016-03-21 LAB — BASIC METABOLIC PANEL
BUN: 17 mg/dL (ref 7–25)
CALCIUM: 9.6 mg/dL (ref 8.6–10.3)
CO2: 28 mmol/L (ref 20–31)
Chloride: 99 mmol/L (ref 98–110)
Creat: 0.89 mg/dL (ref 0.70–1.33)
Glucose, Bld: 115 mg/dL — ABNORMAL HIGH (ref 65–99)
Potassium: 4.8 mmol/L (ref 3.5–5.3)
SODIUM: 137 mmol/L (ref 135–146)

## 2016-03-21 LAB — LIPID PANEL
Cholesterol: 186 mg/dL (ref 125–200)
HDL: 53 mg/dL
LDL Cholesterol: 118 mg/dL
Total CHOL/HDL Ratio: 3.5 ratio
Triglycerides: 75 mg/dL
VLDL: 15 mg/dL

## 2016-03-21 LAB — PSA: PSA: 0.8 ng/mL

## 2016-03-21 LAB — HEPATIC FUNCTION PANEL
ALT: 12 U/L (ref 9–46)
AST: 25 U/L (ref 10–35)
Albumin: 4.4 g/dL (ref 3.6–5.1)
Alkaline Phosphatase: 39 U/L — ABNORMAL LOW (ref 40–115)
Bilirubin, Direct: 0.2 mg/dL
Indirect Bilirubin: 0.7 mg/dL (ref 0.2–1.2)
Total Bilirubin: 0.9 mg/dL (ref 0.2–1.2)
Total Protein: 7.1 g/dL (ref 6.1–8.1)

## 2016-03-27 LAB — TOXASSURE SELECT 13 (MW), URINE: PDF: 0

## 2016-04-07 ENCOUNTER — Encounter: Payer: Self-pay | Admitting: Family Medicine

## 2016-06-10 ENCOUNTER — Ambulatory Visit (INDEPENDENT_AMBULATORY_CARE_PROVIDER_SITE_OTHER): Payer: No Typology Code available for payment source | Admitting: Family Medicine

## 2016-06-10 ENCOUNTER — Encounter: Payer: Self-pay | Admitting: Family Medicine

## 2016-06-10 VITALS — BP 122/84 | Ht 72.0 in | Wt 161.8 lb

## 2016-06-10 DIAGNOSIS — Z23 Encounter for immunization: Secondary | ICD-10-CM | POA: Diagnosis not present

## 2016-06-10 DIAGNOSIS — J431 Panlobular emphysema: Secondary | ICD-10-CM

## 2016-06-10 DIAGNOSIS — G8929 Other chronic pain: Secondary | ICD-10-CM

## 2016-06-10 DIAGNOSIS — M5440 Lumbago with sciatica, unspecified side: Secondary | ICD-10-CM

## 2016-06-10 DIAGNOSIS — F5101 Primary insomnia: Secondary | ICD-10-CM | POA: Diagnosis not present

## 2016-06-10 DIAGNOSIS — J441 Chronic obstructive pulmonary disease with (acute) exacerbation: Secondary | ICD-10-CM

## 2016-06-10 DIAGNOSIS — Z79891 Long term (current) use of opiate analgesic: Secondary | ICD-10-CM | POA: Diagnosis not present

## 2016-06-10 MED ORDER — HYDROCODONE-ACETAMINOPHEN 7.5-325 MG PO TABS: 1.0000 | ORAL_TABLET | Freq: Four times a day (QID) | ORAL | 0 refills | Status: DC

## 2016-06-10 MED ORDER — FLUTICASONE-SALMETEROL 250-50 MCG/DOSE IN AEPB: 1.0000 | INHALATION_SPRAY | Freq: Two times a day (BID) | RESPIRATORY_TRACT | 5 refills | Status: DC

## 2016-06-10 NOTE — Progress Notes (Signed)
   Subjective:    Patient ID: Timothy Davis, male    DOB: 04/04/1961, 55 y.o.   MRN: [id number] Patient arrives office with several concerns HPI This patient was seen today for chronic pain  The medication list was reviewed and updated.   -Compliance with medication: yes  - Number patient states they take daily: 4 tabs a day  -when was the last dose patient took? today  The patient was advised the importance of maintaining medication and not using illegal substances with these.  Refills needed: yes  The patient was educated that we can provide 3 monthly scripts for their medication, it is their responsibility to follow the instructions.  Side effects or complications from medications: none  Patient is aware that pain medications are meant to minimize the severity of the pain to allow their pain levels to improve to allow for better function. They are aware of that pain medications cannot totally remove their pain.  Due for UDT ( at least once per year) : completed 8/17 Handling pain med with out sig side efects    Patient would like to discuss switching from advair to new inhaler for copd. Patient saw an advertisement for a new inhaler wonders if he can take it. This far as insurance is been very challenging regarding covering new medication. This is in regards to new medicine called anolo, which is a long-acting anti-cholinergic. Patient reports his breathing continues to worsen. Frustrated by. Short of breath with any type of exertion  Substantial trouble sleeping. States Ambien definitely helps. No obvious side effects. States deftly needs it  Review of Systems No headache, no major weight loss or weight gain, no chest pain no back pain abdominal pain no change in bowel habits complete ROS otherwise negative     Objective:   Physical Exam  Alert vitals stable, NAD. Blood pressure good on repeat. HEENT normal. Lungs Diffusely diminished breath sounds otherwise clear. Heart  regular rate and rhythm. Positive low back pain to percussion plus minus straight leg raise bilateral      Assessment & Plan:  Impression 1 chronic back pain with need for narcotics multiple times per day. Patient tolerates meds well and states he needs to maintain function #2 COPD clinically worsening. Frustrating to family. Wonders if he can possibly do anything else #3 insomnia ongoing need for meds clarified and rediscuss plan maintain current medicines. Pain medicine refilled. Flu shot. Diet exercise discussed. Referral to pulmonary per patient and family request

## 2016-06-12 ENCOUNTER — Encounter: Payer: Self-pay | Admitting: Family Medicine

## 2016-07-06 ENCOUNTER — Other Ambulatory Visit: Payer: Self-pay | Admitting: Family Medicine

## 2016-07-08 ENCOUNTER — Other Ambulatory Visit: Payer: Self-pay | Admitting: *Deleted

## 2016-07-08 MED ORDER — ZOLPIDEM TARTRATE 10 MG PO TABS: ORAL_TABLET | ORAL | 5 refills | Status: DC

## 2016-08-02 ENCOUNTER — Telehealth: Payer: Self-pay | Admitting: Pulmonary Disease

## 2016-08-02 NOTE — Telephone Encounter (Signed)
PFT   03/05/12: FVC 4.01 L (74%) FEV1 1.00 L (24%) FEV1/FVC 0.25 FEF 25-75 0.22 L (6%) positive bronchodilator response TLC 10.11 L (137%) RV 284% ERV 109% DLCO uncorrected 52%  IMAGING CXR PA/LAT 03/17/17 (personally reviewed by me): Bilateral linear opacification consistent with scarring and previous chest x-ray imaging. No new mass or opacity appreciated. Hyperinflation with barreling of the chest and deep sulci bilaterally. No pleural effusion or thickening appreciated. Heart normal in size & mediastinum normal in contour.  CT CHEST W/ CONTRAST 9/1/9 (personally reviewed by me): No pleural effusion or thickening. Left rib fracture noted. Apical predominant emphysematous changes. No pathologic mediastinal adenopathy. No pericardial effusion. No parenchymal nodule or opacity other than linear opacity in the apices consistent with scar formation.  LABS 08/25/13 ANA:  Negative  03/05/12 ABG on RA:  7.42 / 38 / 85 / 96%  08/28/09 ABG on RA:  7.44 / 38 / 65 / 96%

## 2016-08-05 ENCOUNTER — Ambulatory Visit (INDEPENDENT_AMBULATORY_CARE_PROVIDER_SITE_OTHER): Payer: No Typology Code available for payment source | Admitting: Pulmonary Disease

## 2016-08-05 ENCOUNTER — Other Ambulatory Visit: Payer: Medicare Other

## 2016-08-05 ENCOUNTER — Encounter: Payer: Self-pay | Admitting: Pulmonary Disease

## 2016-08-05 ENCOUNTER — Ambulatory Visit (INDEPENDENT_AMBULATORY_CARE_PROVIDER_SITE_OTHER)
Admission: RE | Admit: 2016-08-05 | Discharge: 2016-08-05 | Disposition: A | Discharge status: Home / Self Care | Payer: No Typology Code available for payment source | Source: Ambulatory Visit | Attending: Pulmonary Disease | Admitting: Pulmonary Disease

## 2016-08-05 VITALS — BP 160/100 | HR 73 | Ht 72.0 in | Wt 159.0 lb

## 2016-08-05 DIAGNOSIS — J439 Emphysema, unspecified: Secondary | ICD-10-CM

## 2016-08-05 DIAGNOSIS — Z23 Encounter for immunization: Secondary | ICD-10-CM | POA: Diagnosis not present

## 2016-08-05 DIAGNOSIS — J449 Chronic obstructive pulmonary disease, unspecified: Secondary | ICD-10-CM

## 2016-08-05 DIAGNOSIS — R0789 Other chest pain: Secondary | ICD-10-CM

## 2016-08-05 MED ORDER — TIOTROPIUM BROMIDE-OLODATEROL 2.5-2.5 MCG/ACT IN AERS: 2.0000 | INHALATION_SPRAY | Freq: Every day | RESPIRATORY_TRACT | 0 refills | Status: DC

## 2016-08-05 NOTE — Patient Instructions (Addendum)
   Check with your pharmacy to see if they will cover your new Stiolto inhaler which replaces your Advair.  Use the Stiolto inhaler - 2 twists inhaled once daily. Stop using it if you have any trouble peeing or blurry vision.   Continue using your albuterol inhaler as needed.  [facility] will contact you to set up the appointment to evaluate and discuss potential lung transplant.  Call me if you have any new questions or concerns before your next appointment.   TESTS ORDERED: 1. Full PFTs at next appointment 2. 6MWT on room air at next appointment 3. Serum Alpha-1 Antitrypsin Phenotype today  4. CXR PA/LAT Today

## 2016-08-05 NOTE — Progress Notes (Signed)
Subjective:    Patient ID: Timothy Davis, male    DOB: May 06, 1961, 56 y.o.   MRN: [id number]  HPI The patient was diagnosed with COPD years ago. He feels his dyspnea is progressively worsening. Previously was on Symbicort but switched back to Advair because his insurance wouldn't carry it any more. He feels his breathing has definitely worsened since switching to Advair. Previously was on a different inhaler but doesn't recall the name. Reports he has never been on Spiriva. He uses his rescue inhaler at least 3 times daily. He does have problems with insomnia and doesn't seem to wake up with sleeping problems when he is able to go to sleep. He reports he coughs occasionally and it is nonproductive. He has frequent wheezing. Denies any history of recurrent exacerbations. No history of recurrent bronchitis. He reports his walking distance has decreased. He reports he was walking 1/2 a mile 3 times daily previously. He hasn't been able to do that recently. He reports he did fall on Friday on his right side and is having right-sided chest pain with bruising. He denies any other chest pain or pressure. No chest tightness except with exposure to extremes in hot & cold. No fever, chills, or sweats. No dysuria, hematuria, or urinary hesitancy.   Review of Systems No joint swelling or erythema. No adenopathy in his neck, groin, or axilla. No nausea with normal bowel movements. A pertinent 14 point review of systems is negative except as per the history of presenting illness.  No Known Allergies  Current Outpatient Prescriptions on File Prior to Visit  Medication Sig Dispense Refill  . albuterol (PROAIR HFA) 108 (90 Base) MCG/ACT inhaler INHALE TWO PUFFS EVERY SIX HOURS AS NEEDED 8.5 g 11  . Fluticasone-Salmeterol (ADVAIR DISKUS) 250-50 MCG/DOSE AEPB Inhale 1 puff into the lungs 2 (two) times daily. 1 each 5  . HYDROcodone-acetaminophen (NORCO) 7.5-325 MG tablet Take 1 tablet by mouth 4 (four) times daily.  120 tablet 0  . zolpidem (AMBIEN) 10 MG tablet TAKE ONE (1) TABLET AT BEDTIME AS NEEDEDFOR SLEEP 30 tablet 5   No current facility-administered medications on file prior to visit.     Past Medical History:  Diagnosis Date  . Asthmatic bronchitis   . Chronic back pain   . COPD (chronic obstructive pulmonary disease) ([place])   . Hepatitis C antibody test positive    noticed trying to give blood  . Insomnia   . Shortness of breath     Past Surgical History:  Procedure Laterality Date  . COLONOSCOPY  04/14/2012   Fields-6 polyps-tubular adenoma x 3 and hyperplastic polyps x 2/internal hemorrhoids/mild diverticulosis in the sigmoid colon, 1CM TA rectum    Family History  Problem Relation Age of Onset  . COPD Mother   . Heart attack Mother   . Asthma Mother   . Lung disease Brother     Social History   Social History  . Marital status: Married    Spouse name: N/A  . Number of children: 2  . Years of education: N/A   Occupational History  . disabled Disabled   Social History Main Topics  . Smoking status: Former Smoker    Packs/day: 1.50    Years: 27.00    Types: Cigarettes    Start date: 12/16/1974    Quit date: 04/23/2002  . Smokeless tobacco: Current User    Types: Chew  . Alcohol use No     Comment: Hx heavy etoh (daily) x 45yrs,  QUIT 12yrs ago  . Drug use: No     Comment: Hx marijuana, intranasal drugs, crack, cocaine.  QUIT 5 yrs ago  . Sexual activity: Yes    Partners: Female     Comment: monagamous   Other Topics Concern  . None   Social History Narrative   Lives w/ wife, 2sons      [facility] (08/05/16):   Originally from [place], [place]. Moved to [place] when he was 56 y.o. Since then he has lived in [place]. 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 No known asbestos exposure. Currently has dogs and a cat. No bird or mold exposure. Enjoys fishing.       Objective:    Physical Exam BP (!) 160/100 (BP Location: Right Arm, Cuff Size: Normal)   Pulse 73   Ht 6' (1.829 m)   Wt 159 lb (72.1 kg)   SpO2 91%   BMI 21.56 kg/m  General:  Awake. Alert. No acute distress. Wife with patient today. Integument:  Warm & dry. No rash on exposed skin. No bruising on ribs or abdomen. Extremities:  No cyanosis or clubbing.  Lymphatics:  No appreciated cervical or supraclavicular lymphadenoapthy. HEENT:  Moist mucus membranes. No oral ulcers. No scleral injection or icterus.  Cardiovascular:  Regular rate. No edema. No appreciable JVD.  Pulmonary:  Good aeration & clear to auscultation bilaterally. Symmetric chest wall expansion. No accessory muscle use. Abdomen: Soft. Normal bowel sounds. Nondistended. Mild tenderness to palpation of right abdomen. Musculoskeletal:  Normal bulk and tone. Hand grip strength 5/5 bilaterally. No joint deformity or effusion appreciated. No chest wall deformity.  Neurological:  CN 2-12 grossly in tact. No meningismus. Moving all 4 extremities equally. Symmetric brachioradialis deep tendon reflexes. Psychiatric:  Mood and affect congruent. Speech normal rhythm, rate & tone.   PFT   03/05/12: FVC 4.01 L (74%) FEV1 1.00 L (24%) FEV1/FVC 0.25 FEF 25-75 0.22 L (6%) positive bronchodilator response TLC 10.11 L (137%) RV 284% ERV 109% DLCO uncorrected 52%  IMAGING CXR PA/LAT 03/17/12 (personally reviewed by me): Bilateral linear opacification consistent with scarring and previous chest x-ray imaging. No new mass or opacity appreciated. Hyperinflation with barreling of the chest and deep sulci bilaterally. No pleural effusion or thickening appreciated. Heart normal in size & mediastinum normal in contour.  CT CHEST W/ CONTRAST 9/1/9 (personally reviewed by me): No pleural effusion or thickening. Left rib fracture noted. Apical predominant emphysematous changes. No pathologic mediastinal adenopathy. No pericardial effusion. No parenchymal nodule or  opacity other than linear opacity in the apices consistent with scar formation.  LABS 08/25/13 ANA:  Negative  03/05/12 ABG on RA:  7.42 / 38 / 85 / 96%  08/28/09 ABG on RA:  7.44 / 38 / 65 / 96%    Assessment & Plan:  56 y.o. male with history of very severe COPD based on previous pulmonary function testing from 2013 with evidence of hyperinflation and a moderately reduced carbon dioxide diffusion capacity. I question whether or not the patient could have underlying alpha-1 antitrypsin deficiency; although, with his history of hepatitis C and history of alcohol use remotely this would provide an alternative etiologies to his liver cirrhosis. He has no chest wall deformity therefore I feel his chest wall and upper abdominal pain is likely secondary to his fall on Friday. I am going to evaluate for possible rib fractures today. I am  attempting to switch the patient's inhaler regimen over to an alternative that should work better given the severity of his lung disease and the high probability of ineffective delivery of her inhaled medications. I instructed the patient contact my office if he had any new breathing problems or questions before his next appointment. Given his young age and severity of his lung disease based on previous testing I feel that evaluation at a lung transplant center is reasonable at this time.  1. Very Severe COPD w/ Emphysema: Screening for alpha-1 antitrypsin deficiency. Trying patient on Stiolto Respimat. Repeat full pulmonary function testing and 6 minute walk test on room air at next appointment. Referring to [facility] for [facility] Transplant Evaluation. 2. Chest Wall Pain: Checking chest x-ray PA/LAT today. 3. Health Maintenance:  S/P Influenza Vaccine November 2017 & Pneumovax August 2013. Administering Prevnar 13 today. 4. Follow-up: Return to clinic in 6 weeks or sooner if needed.  Sonia Baller Ashok Cordia, M.D. [facility] Pulmonary & Critical Care Pager:  [phone] After 3pm  or if no response, call [phone] 10:52 AM 08/05/16

## 2016-08-06 ENCOUNTER — Telehealth: Payer: Self-pay | Admitting: Pulmonary Disease

## 2016-08-06 NOTE — Telephone Encounter (Signed)
  Notes Recorded by Javier Glazier, MD on 08/05/2016 at 1:16 PM EST Please let the patient know that his x-ray does not show any obvious fracture. Thank you.  Pt spouse's (dpr) made aware of results and voiced her understanding. Nothing further needed.

## 2016-08-09 LAB — ALPHA-1 ANTITRYPSIN PHENOTYPE: A1 ANTITRYPSIN: 155 mg/dL (ref 83–199)

## 2016-08-23 ENCOUNTER — Telehealth: Payer: Self-pay | Admitting: Pulmonary Disease

## 2016-08-23 NOTE — Telephone Encounter (Signed)
Spoke with [employer], I advised them that pt has only been here once and we do not have a sleep study nor CT scan on file for pt. She verbalized understanding and I advised her to call back if they can figure out if they need anything from [place]. Nothing further needed.

## 2016-09-09 ENCOUNTER — Telehealth: Payer: Self-pay

## 2016-09-09 NOTE — Telephone Encounter (Signed)
Patient does not wish to proceed with transplant eval d/t cost.  Forwarding to [facility] as FYI.

## 2016-09-09 NOTE — Telephone Encounter (Signed)
Pt states he cant afford the transplant so there was no need for him to call them

## 2016-09-09 NOTE — Telephone Encounter (Signed)
lmtcb X1 for pt on home and cell # listed- letter received from [facility] stating that he has been approved for lung transplant eval, but they have been unsuccessful in reaching patient to schedule evaluation.  Pt needs to contact [facility] within 31 days of letter (08/28/2016) at [phone] or [phone], or his referral will be closed by [place].    Wcb.

## 2016-09-10 ENCOUNTER — Encounter: Payer: Self-pay | Admitting: Family Medicine

## 2016-09-10 ENCOUNTER — Ambulatory Visit (INDEPENDENT_AMBULATORY_CARE_PROVIDER_SITE_OTHER): Payer: No Typology Code available for payment source | Admitting: Family Medicine

## 2016-09-10 VITALS — BP 138/88 | Ht 72.0 in | Wt 163.0 lb

## 2016-09-10 DIAGNOSIS — F5101 Primary insomnia: Secondary | ICD-10-CM

## 2016-09-10 DIAGNOSIS — M5440 Lumbago with sciatica, unspecified side: Secondary | ICD-10-CM | POA: Diagnosis not present

## 2016-09-10 DIAGNOSIS — G8929 Other chronic pain: Secondary | ICD-10-CM | POA: Diagnosis not present

## 2016-09-10 DIAGNOSIS — Z79891 Long term (current) use of opiate analgesic: Secondary | ICD-10-CM

## 2016-09-10 DIAGNOSIS — J431 Panlobular emphysema: Secondary | ICD-10-CM

## 2016-09-10 MED ORDER — HYDROCODONE-ACETAMINOPHEN 7.5-325 MG PO TABS: 1.0000 | ORAL_TABLET | Freq: Four times a day (QID) | ORAL | 0 refills | Status: DC

## 2016-09-10 NOTE — Progress Notes (Signed)
   Subjective:    Patient ID: Timothy Davis, male    DOB: June 26, 1961, 56 y.o.   MRN: [id number]  HPI This patient was seen today for chronic pain. Chronic back pain.   The medication list was reviewed and updated.   -Compliance with medication: yes  - Number patient states they take daily: 4 a day  -when was the last dose patient took? today  The patient was advised the importance of maintaining medication and not using illegal substances with these.  Refills needed: yes  The patient was educated that we can provide 3 monthly scripts for their medication, it is their responsibility to follow the instructions.  Side effects or complications from medications: none  Patient is aware that pain medications are meant to minimize the severity of the pain to allow their pain levels to improve to allow for better function. They are aware of that pain medications cannot totally remove their pain.  Due for UDT ( at least once per year) : last one august 2017  Patient compliant with pain medication. Continues to experience the pain which led to initiation of analgesic intervention. No significant negative side effects. States definitely needs the pain medication to maintain current level of functioning. Does not receive controlled substance pain medication elsewhere.  Low back very painful often rad ito the right leg with numbnss, sometimes both legs   Patient compliant with insomnia medication. Generally takes most nights. No obvious morning drowsiness. Definitely helps patient sleep. Without it patient states would not get a good nights rest.  Ongoing challenges with chronic lung disease. Uses albuterol couple times per day. Trying to exercise regularly. Shortness of breath with any kind of exertion. Was on Advair. Was given samples for new medication. Has run out of medicine samples and would like to go back to Advair. Discussed will maintain     Review of Systems No headache, no major  weight loss or weight gain, no chest pain no back pain abdominal pain no change in bowel habits complete ROS otherwise negative     Objective:   Physical Exam  Alert vitals stable, NAD. Blood pressure good on repeat. HEENT normal. Lungs clear. Heart regular rate and rhythm.       Assessment & Plan:  Impression: Chronic pain. Patient compliant with medication. No substantial side effects. [place] controlled substance registry reviewed to ensure compliance and proper use of medication. Patient aware goal of medicine is not complete resolution of pain but to control his symptoms to improve his functional capacity. Aware of potential adverse side effects Insomnia ongoing definitely need for medication med use discussed compliant side effects discussed number next chronic lung disease with reactive airway component. Discussed at length. Patient to resume Advair for now. Albuterol when necessary exercise discussed in encourage WSL

## 2016-09-13 ENCOUNTER — Ambulatory Visit (HOSPITAL_COMMUNITY)
Admission: RE | Admit: 2016-09-13 | Discharge: 2016-09-13 | Disposition: A | Discharge status: Home / Self Care | Payer: No Typology Code available for payment source | Source: Ambulatory Visit | Attending: Pulmonary Disease | Admitting: Pulmonary Disease

## 2016-09-13 DIAGNOSIS — J439 Emphysema, unspecified: Secondary | ICD-10-CM

## 2016-09-13 DIAGNOSIS — J449 Chronic obstructive pulmonary disease, unspecified: Secondary | ICD-10-CM | POA: Insufficient documentation

## 2016-09-13 LAB — PULMONARY FUNCTION TEST
FEF 25-75 Post: 0.39 L/sec
FEF 25-75 Pre: 0.37 L/sec
FEF2575-%Change-Post: 4 %
FEF2575-%PRED-POST: 11 %
FEF2575-%PRED-PRE: 11 %
FEV1-%Change-Post: 0 %
FEV1-%Pred-Post: 23 %
FEV1-%Pred-Pre: 23 %
FEV1-Post: 0.92 L
FEV1-Pre: 0.93 L
FEV1FVC-%Change-Post: -4 %
FEV1FVC-%Pred-Pre: 51 %
FEV6-%CHANGE-POST: 4 %
FEV6-%PRED-POST: 45 %
FEV6-%Pred-Pre: 44 %
FEV6-PRE: 2.22 L
FEV6-Post: 2.31 L
FEV6FVC-%CHANGE-POST: 0 %
FEV6FVC-%Pred-Post: 99 %
FEV6FVC-%Pred-Pre: 99 %
FVC-%CHANGE-POST: 4 %
FVC-%PRED-POST: 46 %
FVC-%Pred-Pre: 44 %
FVC-Post: 2.43 L
FVC-Pre: 2.33 L
POST FEV1/FVC RATIO: 38 %
POST FEV6/FVC RATIO: 95 %
PRE FEV1/FVC RATIO: 40 %
Pre FEV6/FVC Ratio: 95 %
RV % PRED: 290 %
RV: 6.57 L
TLC % pred: 125 %
TLC: 9.25 L

## 2016-09-13 MED ORDER — ALBUTEROL SULFATE (2.5 MG/3ML) 0.083% IN NEBU
2.5000 mg | INHALATION_SOLUTION | Freq: Once | RESPIRATORY_TRACT | Status: AC
Start: 2016-09-13 — End: 2016-09-13
  Administered 2016-09-13: 2.5 mg via RESPIRATORY_TRACT

## 2016-09-18 ENCOUNTER — Ambulatory Visit (INDEPENDENT_AMBULATORY_CARE_PROVIDER_SITE_OTHER): Payer: No Typology Code available for payment source | Admitting: Pulmonary Disease

## 2016-09-18 ENCOUNTER — Encounter: Payer: Self-pay | Admitting: Pulmonary Disease

## 2016-09-18 VITALS — BP 120/90 | HR 58 | Ht 72.0 in | Wt 161.4 lb

## 2016-09-18 DIAGNOSIS — J439 Emphysema, unspecified: Secondary | ICD-10-CM | POA: Diagnosis not present

## 2016-09-18 DIAGNOSIS — J449 Chronic obstructive pulmonary disease, unspecified: Secondary | ICD-10-CM | POA: Diagnosis not present

## 2016-09-18 MED ORDER — TIOTROPIUM BROMIDE-OLODATEROL 2.5-2.5 MCG/ACT IN AERS: 2.0000 | INHALATION_SPRAY | Freq: Every day | RESPIRATORY_TRACT | 11 refills | Status: DC

## 2016-09-18 NOTE — Patient Instructions (Addendum)
   Call me if you have any new breathing problems before your next appointment.  We will switch you over from Advair to [place]. Call me if you have any questions or problems.  I will see you back in 6 months or sooner if needed.

## 2016-09-18 NOTE — Progress Notes (Signed)
Subjective:    Patient ID: Timothy Davis, male    DOB: 27-Aug-1960, 56 y.o.   MRN: [id number]  C.C.:  Follow-up for Very Severe COPD w/ Emphysema.  HPI Very Severe COPD w/ Emphysema:  Patient reports his coughing was significantly improved on Stiolto. Denies any adverse effects to the medication. He denies any recent coughing or wheezing. No exacerbations since last appointment. Patient declined lung transplant referral due to cost. He is using his rescue inhaler during his daily walks.   Review of Systems No chest pain or pressure. No fever, chills, or sweats. No abdominal pain, nausea or emesis.   No Known Allergies  Current Outpatient Prescriptions on File Prior to Visit  Medication Sig Dispense Refill  . albuterol (PROAIR HFA) 108 (90 Base) MCG/ACT inhaler INHALE TWO PUFFS EVERY SIX HOURS AS NEEDED 8.5 g 11  . Fluticasone-Salmeterol (ADVAIR DISKUS) 250-50 MCG/DOSE AEPB Inhale 1 puff into the lungs 2 (two) times daily. 1 each 5  . HYDROcodone-acetaminophen (NORCO) 7.5-325 MG tablet Take 1 tablet by mouth 4 (four) times daily. 120 tablet 0  . zolpidem (AMBIEN) 10 MG tablet TAKE ONE (1) TABLET AT BEDTIME AS NEEDEDFOR SLEEP 30 tablet 5   No current facility-administered medications on file prior to visit.     Past Medical History:  Diagnosis Date  . Asthmatic bronchitis   . Chronic back pain   . COPD (chronic obstructive pulmonary disease) ([place])   . Hepatitis C antibody test positive    noticed trying to give blood  . Insomnia   . Shortness of breath     Past Surgical History:  Procedure Laterality Date  . COLONOSCOPY  04/14/2012   Fields-6 polyps-tubular adenoma x 3 and hyperplastic polyps x 2/internal hemorrhoids/mild diverticulosis in the sigmoid colon, 1CM TA rectum    Family History  Problem Relation Age of Onset  . COPD Mother   . Heart attack Mother   . Asthma Mother   . Lung disease Brother     Social History   Social History  . Marital status: Married   Spouse name: N/A  . Number of children: 2  . Years of education: N/A   Occupational History  . disabled Disabled   Social History Main Topics  . Smoking status: Former Smoker    Packs/day: 1.50    Years: 27.00    Types: Cigarettes    Start date: 12/16/1974    Quit date: 04/23/2002  . Smokeless tobacco: Current User    Types: Chew  . Alcohol use No     Comment: Hx heavy etoh (daily) x 11yrs, QUIT 49yrs ago  . Drug use: No     Comment: Hx marijuana, intranasal drugs, crack, cocaine.  QUIT 5 yrs ago  . Sexual activity: Yes    Partners: Female     Comment: monagamous   Other Topics Concern  . None   Social History Narrative   Lives w/ wife, 2sons      [facility] (08/05/16):   Originally from [place], [place]. Moved to [place] when he was 56 y.o. Since then he has lived in [place]. 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 No known asbestos exposure. Currently has dogs and a cat. No bird or mold exposure. Enjoys fishing.       Objective:   Physical Exam BP 120/90 (BP Location: Left Arm, Patient Position: Sitting, Cuff Size: Normal)  Pulse (!) 58   Ht 6' (1.829 m)   Wt 161 lb 6.4 oz (73.2 kg)   SpO2 100%   BMI 21.89 kg/m  Gen.: Thin Caucasian male. No distress. Awake. Integument: Warm and dry. No rash on exposed skin. Cardiovascular: Regular rate. Normal S1 & S2. No edema. Pulmonary: Good aeration & clear on auscultation. No accessory muscle use on room air. Abdomen: Soft. Nondistended. Normal bowel sounds. Extremities: No cyanosis or clubbing.   PFT   09/13/16: FVC 2.33 L (44%) FEV1 0.93 L (23%) FEV1/FVC 0.40 FEF 25-75 0.37 L (11%) negative bronchodilator response TLC 7.38 L (6125%] ERV 290% ERV 1% (DLCO could not be performed) 03/05/12: FVC 4.01 L (74%) FEV1 1.00 L (24%) FEV1/FVC 0.25 FEF 25-75 0.22 L (6%) positive bronchodilator response TLC 10.11 L (137%) RV 284% ERV 109% DLCO uncorrected  52%  6MWT 09/18/16:  Walked 343 meters / Baseline Sat 98% on RA / Nadir Sat 96% on RA  IMAGING CXR PA/LAT 03/17/12 (previously reviewed by me): Bilateral linear opacification consistent with scarring and previous chest x-ray imaging. No new mass or opacity appreciated. Hyperinflation with barreling of the chest and deep sulci bilaterally. No pleural effusion or thickening appreciated. Heart normal in size & mediastinum normal in contour.  CT CHEST W/ CONTRAST 9/1/9 (previously reviewed by me): No pleural effusion or thickening. Left rib fracture noted. Apical predominant emphysematous changes. No pathologic mediastinal adenopathy. No pericardial effusion. No parenchymal nodule or opacity other than linear opacity in the apices consistent with scar formation.  LABS 08/05/16 Alpha-1 antitrypsin: MM (155)  08/25/13 ANA:  Negative  03/05/12 ABG on RA:  7.42 / 38 / 85 / 96%  08/28/09 ABG on RA:  7.44 / 38 / 65 / 96%    Assessment & Plan:  56 y.o. male with very severe COPD with pulmonary emphysema. Symptomatically the patient was better controlled on Stiolto compared with Advair. Reviewing his spirometry does show relative stability in his FEV1 but a significant reduction in his FVC. I suspect that the findings on his imaging as well as pulmonary function testing are due to not only his tobacco use but also his exposure while welding for many years. We did discuss the benefits of pulmonary rehabilitation and the patient is willing to consider this. His 6 minute walk test today shows no significant oxygen desaturation which is encouraging. Patient does not wish to undergo evaluation for lung transplant at this time and I may readdress this in the future. I instructed the patient contact my office if he had any new breathing problems or questions before his next appointment.  1. Very severe COPD: Switching from aeration from Advair to [employer] Respimat. Continuing albuterol inhaler as needed.  Referring to pulmonary rehabilitation at [facility]. Deferring lung transplant referral at patient's request for now. 2. Pulmonary emphysema: Likely secondary to tobacco use. No evidence of alpha-1 antitrypsin deficiency. 3. Health maintenance: Status post influenza vaccine November 2017, Prevnar vaccine January 2018, & Pneumovax August 2013. 4. Follow-up: Return to clinic in 6 months or sooner if needed.  Sonia Baller Ashok Cordia, M.D. [facility] Pulmonary & Critical Care Pager:  [phone] After 3pm or if no response, call [phone] 4:11 PM 09/18/16

## 2016-09-18 NOTE — Progress Notes (Signed)
Test reviewed.  

## 2016-09-19 ENCOUNTER — Ambulatory Visit: Payer: Medicare Other | Admitting: Pulmonary Disease

## 2016-09-19 ENCOUNTER — Telehealth: Payer: Self-pay | Admitting: Pulmonary Disease

## 2016-09-19 NOTE — Telephone Encounter (Signed)
Spoke with patient wife, states that they were supposed to be given samples of a medication at yesterdays visit for [employer].  Pt wife requested samples be mailed to their home address, I explained that we are unable to mail samples and that these must be picked up at the office as we have to demonstrate how to use them. Wife states that the patient does not drive in [place] and will not be coming back to pick up any samples at this time. States that if they change their mind they will call our office. Will send to Dr Ashok Cordia as Juluis Rainier that the patient did not get any samples at last OV 09/18/16 and refused to come by to pick any up at this point. Nothing further needed.

## 2016-09-30 ENCOUNTER — Telehealth: Payer: Self-pay | Admitting: Pulmonary Disease

## 2016-09-30 NOTE — Telephone Encounter (Signed)
Spoke with pt's wife, Lauro Regulus. States that Stiolto is to expensive at $180. Advised pt's wife to contact their insurance company to find out what other inhalers are covered and how much the copay would be. She verbalized understanding. Will await her call back.

## 2016-10-03 ENCOUNTER — Other Ambulatory Visit: Payer: Self-pay | Admitting: Family Medicine

## 2016-10-04 NOTE — Telephone Encounter (Signed)
Spoke with wife, she states she reached out to pt pharmacy to see if they could contact his insurance but when called for more information was told our office had to be the one to call. Encouraged the wife to reach out to insurance on her own but was concerned that her work schedule would not allow her to and that pt would not do it himself. Attempted to call insurance but was on hold for 10+ mins. Not sure of what other options there are

## 2016-10-09 NOTE — Telephone Encounter (Signed)
[place] and was advised the medication is covered but has a high co-pay. Called [facility] at [phone] and spoke to [facility] and was advised of the following meds and the copay: Bevespi (co-pay of $175), Anoro (co-pay $197), Incruse (co-pay$160), Charlotte Crumb (denied), Spiriva HH (co-pay$197), Trelegy (co-pay$200) - all covered medications are too expensive for pt to afford.   I was then transferred to [place] department, stating there is not a Tier Exception because pt has not met deductible. However, Earnest Bailey states we are able appeal the copay amount stating the pt is unable to afford the medication. A fax has been sent to office informing us of the right to appeal, [facility] will need a letter of medical necessity stating pt is unable to afford medication and benefits from the [facility]   Dr. Ashok Cordia please advise on letter of medical necessity for Stiolto  Will forward to [facility] as well to look out for fax.

## 2016-10-10 ENCOUNTER — Encounter: Payer: Self-pay | Admitting: Pulmonary Disease

## 2016-10-10 NOTE — Telephone Encounter (Signed)
Letter is complete. Go ahead and fax.

## 2016-10-11 NOTE — Telephone Encounter (Signed)
Denial letter with appeal information attached was in triage PA folder.  Letter printed and faxed to [place] Fax # [phone] with denial letter attached as requested.  Letter and packet hanging in triage on white board until Appeal Determination received.  Will hold in triage and will also send to [facility] as FYI.

## 2016-10-11 NOTE — Telephone Encounter (Signed)
I have checked JN's look at, triage fax and the up front fax. This fax has not been received yet. We will need to have a fax number to fax this letter to. I attempted to contact [facility] for this number but they are not open yet this AM. Will try back.

## 2016-10-18 NOTE — Telephone Encounter (Signed)
Carleigh please advise if you've received any further information on this appeal.  Thanks!

## 2016-10-23 NOTE — Telephone Encounter (Signed)
[facility] was called at [phone] and the representative stated the appeal was also denied for the lower cost for the copay. The patient was called and informed of the decision. The patient was also instructed on possible patient assistance for medication through manufacturer. His wife stated she will pick up form on Tuesday. Nothing further is needed.

## 2016-10-28 ENCOUNTER — Telehealth: Payer: Self-pay | Admitting: Pulmonary Disease

## 2016-10-28 ENCOUNTER — Other Ambulatory Visit: Payer: Self-pay | Admitting: Pulmonary Disease

## 2016-10-28 MED ORDER — TIOTROPIUM BROMIDE-OLODATEROL 2.5-2.5 MCG/ACT IN AERS: 2.0000 | INHALATION_SPRAY | Freq: Every day | RESPIRATORY_TRACT | 0 refills | Status: DC

## 2016-10-28 NOTE — Telephone Encounter (Signed)
Spoke with the pt's spouse  She is req sample of Stiolto and also wants to apply for pt assistance for this  I printed form and left it with sample up front for pick up  Nothing further needed

## 2016-12-10 ENCOUNTER — Encounter: Payer: Self-pay | Admitting: Family Medicine

## 2016-12-10 ENCOUNTER — Ambulatory Visit (INDEPENDENT_AMBULATORY_CARE_PROVIDER_SITE_OTHER): Payer: No Typology Code available for payment source | Admitting: Family Medicine

## 2016-12-10 VITALS — BP 132/90 | Ht 72.0 in | Wt 166.0 lb

## 2016-12-10 DIAGNOSIS — J449 Chronic obstructive pulmonary disease, unspecified: Secondary | ICD-10-CM | POA: Diagnosis not present

## 2016-12-10 DIAGNOSIS — F5101 Primary insomnia: Secondary | ICD-10-CM

## 2016-12-10 DIAGNOSIS — M5440 Lumbago with sciatica, unspecified side: Secondary | ICD-10-CM

## 2016-12-10 DIAGNOSIS — G8929 Other chronic pain: Secondary | ICD-10-CM

## 2016-12-10 DIAGNOSIS — Z79891 Long term (current) use of opiate analgesic: Secondary | ICD-10-CM | POA: Diagnosis not present

## 2016-12-10 MED ORDER — HYDROCODONE-ACETAMINOPHEN 7.5-325 MG PO TABS: 1.0000 | ORAL_TABLET | Freq: Four times a day (QID) | ORAL | 0 refills | Status: DC

## 2016-12-10 NOTE — Progress Notes (Signed)
   Subjective:    Patient ID: Timothy Davis, male    DOB: 11-Mar-1961, 56 y.o.   MRN: [id number]  HPI This patient was seen today for chronic pain. Takes for back and shoulder pain.   The medication list was reviewed and updated.   -Compliance with medication: yes  - Number patient states they take daily: four a day  -when was the last dose patient took? today  The patient was advised the importance of maintaining medication and not using illegal substances with these.  Refills needed: hydrocodone and Lorrin Mais  The patient was educated that we can provide 3 monthly scripts for their medication, it is their responsibility to follow the instructions.  Side effects or complications from medications: none  Patient is aware that pain medications are meant to minimize the severity of the pain to allow their pain levels to improve to allow for better function. They are aware of that pain medications cannot totally remove their pain.  Due for UDT ( at least once per year) : last one august 2017  Ambien not helping with sleep. Has been taking otc sleep aid with the ambien to sleep.    Still using albuterol at night   Patient compliant with insomnia medication. Generally takes most nights. No obvious morning drowsiness. Definitely helps patient sleep. Without it patient states would not get a good nights rest.   Review of Systems No headache, no major weight loss or weight gain, no chest pain no back pain abdominal pain no change in bowel habits complete ROS otherwise negative     Objective:   Physical Exam Alert and oriented, vitals reviewed and stable, NAD ENT-TM's and ext canals WNL bilat via otoscopic exam Soft palate, tonsils and post pharynx WNL via oropharyngeal exam Neck-symmetric, no masses; thyroid nonpalpable and nontender Pulmonary-no tachypnea or accessory muscle use; Clear without wheezes via auscultation Card--no abnrml murmurs, rhythm reg and rate WNL Carotid pulses  symmetric, without bruits Positive low back pain to percussion       Assessment & Plan:  Impression chronic back pain discussed with ongoing need for narcotics  Impression: Chronic pain. Patient compliant with medication. No substantial side effects. [place] controlled substance registry reviewed to ensure compliance and proper use of medication. Patient aware goal of medicine is not complete resolution of pain but to control his symptoms to improve his functional capacity. Aware of potential adverse side effects  Insomnia ongoing with need for meds meds discussed may had over-the-counter agents  Ongoing challenges with COPD discussed. Advair removed from list. Continue albuterol importance of exercise reaffirm next  Greater than 50% of this 25 minute face to face visit was spent in counseling and discussion and coordination of care regarding the above diagnosis/diagnosies

## 2017-01-06 ENCOUNTER — Telehealth: Payer: Self-pay | Admitting: Family Medicine

## 2017-01-06 ENCOUNTER — Other Ambulatory Visit: Payer: Self-pay | Admitting: Family Medicine

## 2017-01-06 NOTE — Telephone Encounter (Signed)
Last seen 12/10/16

## 2017-01-06 NOTE — Telephone Encounter (Signed)
Pt is needing refills on zolpidem (AMBIEN) 10 MG tablet   [facility]

## 2017-01-07 ENCOUNTER — Other Ambulatory Visit: Payer: Self-pay | Admitting: *Deleted

## 2017-01-07 MED ORDER — ZOLPIDEM TARTRATE 10 MG PO TABS: ORAL_TABLET | ORAL | 5 refills | Status: DC

## 2017-01-07 NOTE — Telephone Encounter (Signed)
Last seen 12/10/2016

## 2017-01-07 NOTE — Telephone Encounter (Signed)
Patient called to check on this.  He is needing refill today if possible.

## 2017-01-07 NOTE — Telephone Encounter (Signed)
Med sent to pharm. Pt notified on voicemail.  

## 2017-01-07 NOTE — Telephone Encounter (Signed)
Ok six mo worth 

## 2017-01-09 NOTE — Telephone Encounter (Signed)
Addendum: Samples for Stiolto were placed up front to pick up 10/28/16. These were never picked up. Patient assistance forms were not picked up either. Samples have been logged back in and put back on the shelf. Nothing further needed.

## 2017-02-20 IMAGING — US US ABDOMEN LIMITED
1 series · 14 of 25 positions shown · non-contrast
Comparison: 08/09/2014

CLINICAL DATA: Hepatic cirrhosis. Hepatocellular carcinoma
screening.

EXAM:
US ABDOMEN LIMITED - RIGHT UPPER QUADRANT

[Series 1: us abdomen limited · 0.16mm/px · 14 of 55 slices shown]
[im 1/55]
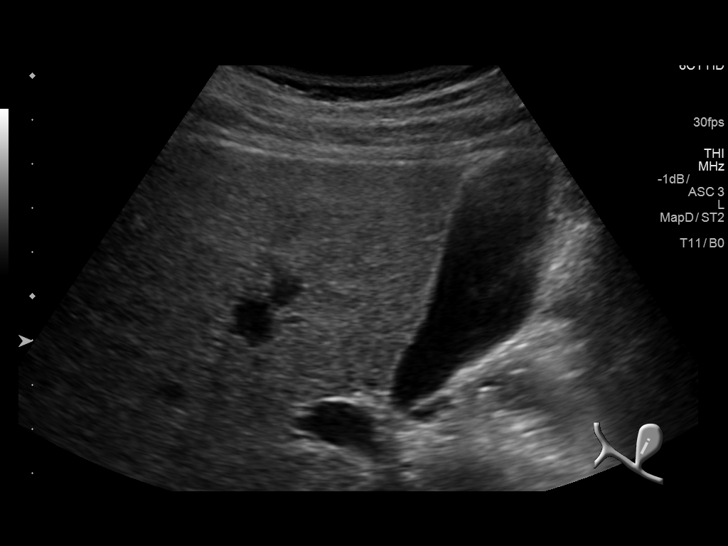
[im 5/55]
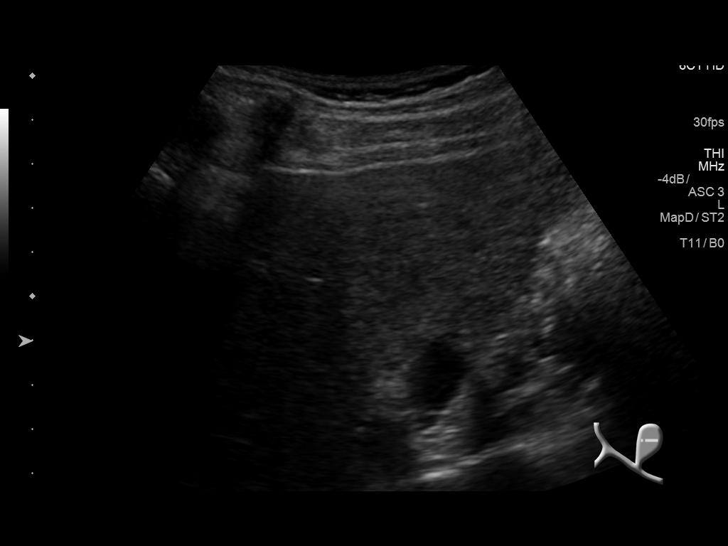
[im 10/55]
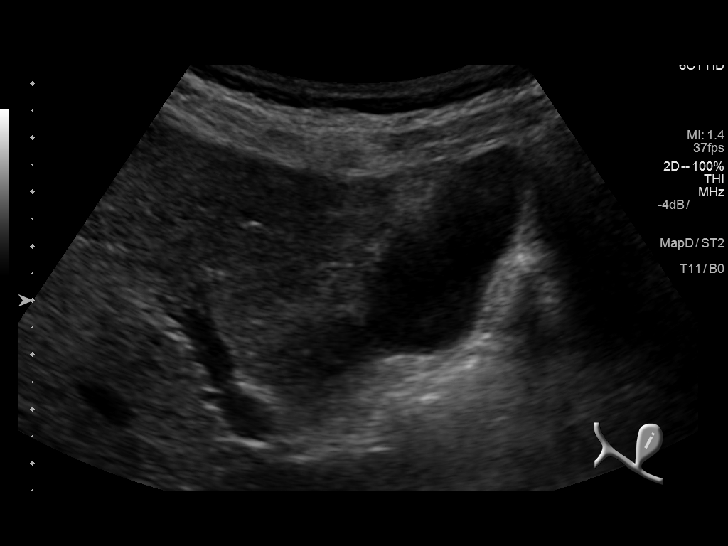
[im 14/55]
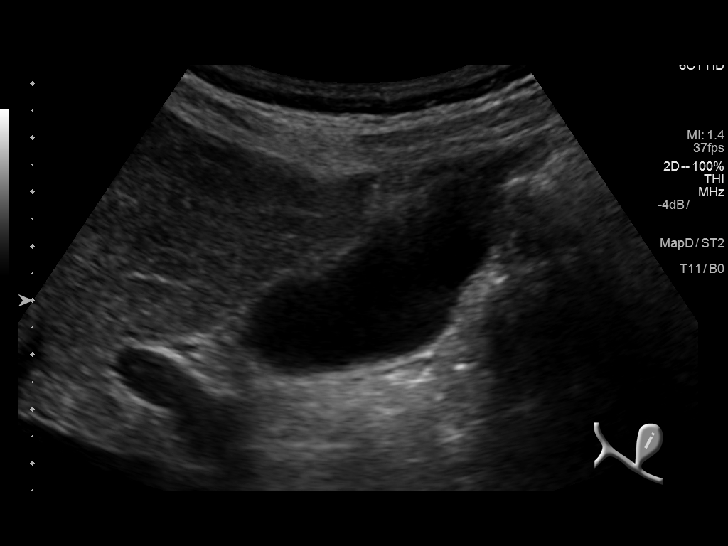
[im 19/55]
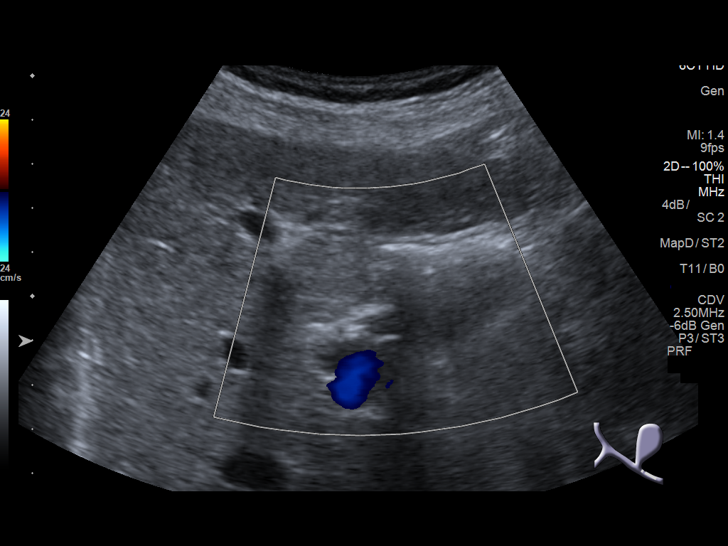
[im 21/55]
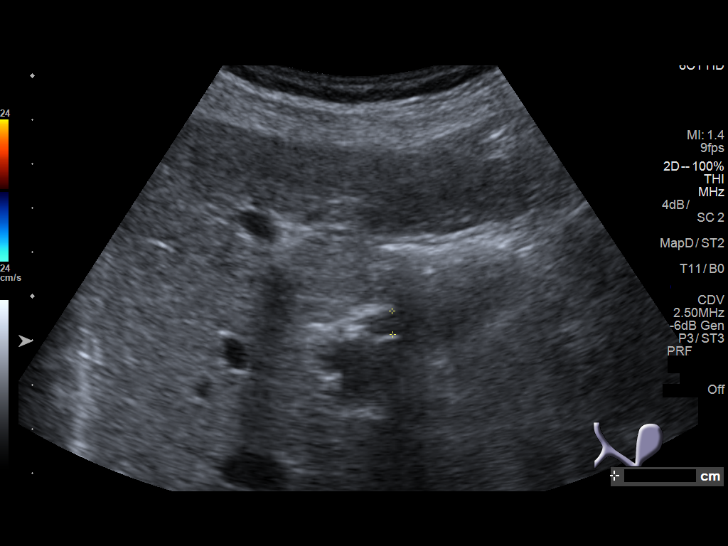
[im 25/55]
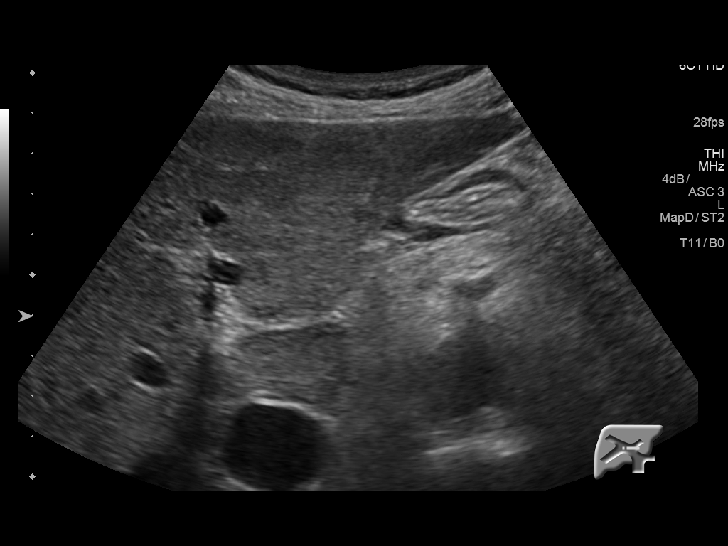
[im 30/55]
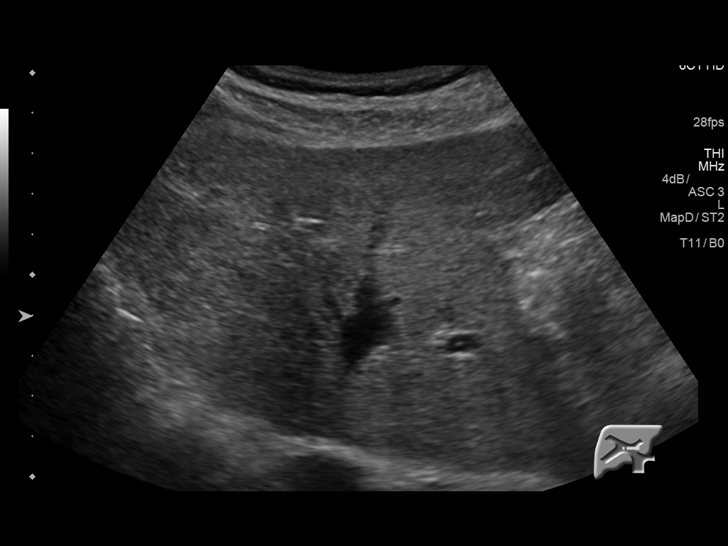
[im 34/55]
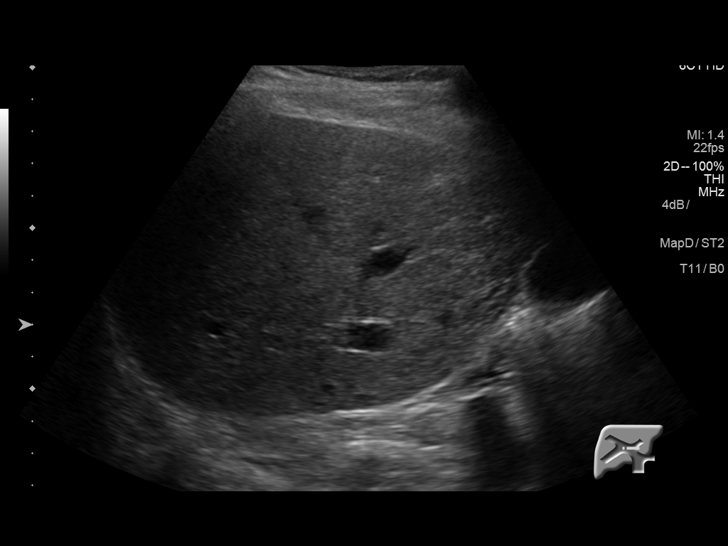
[im 37/55]
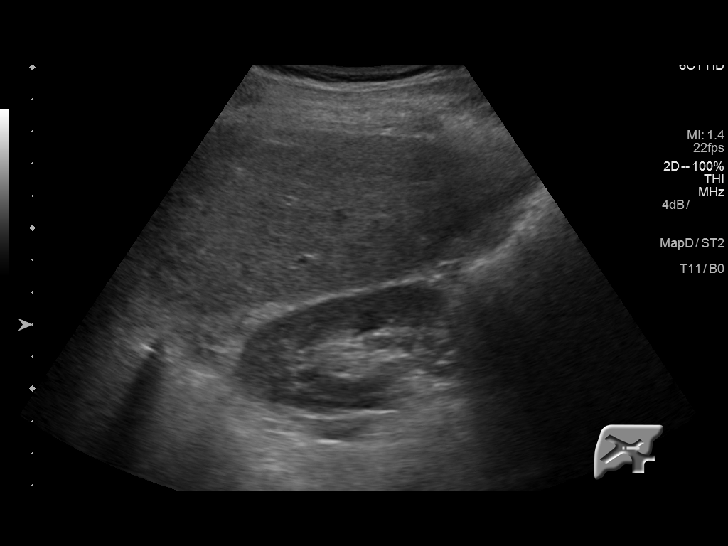
[im 41/55]
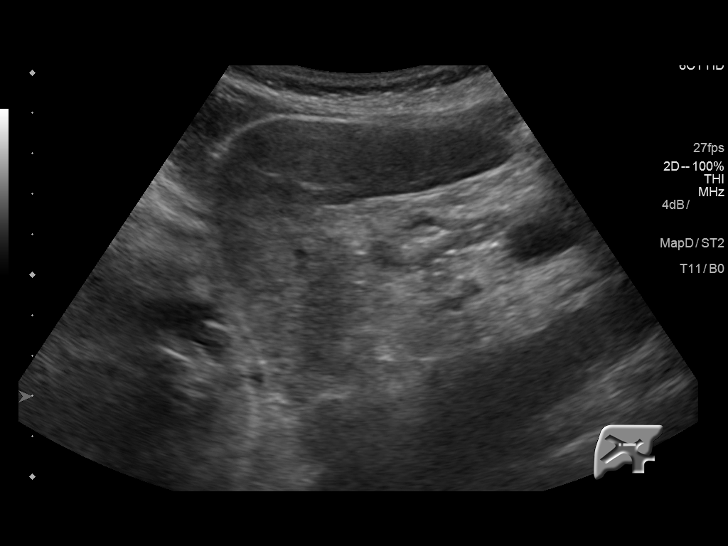
[im 46/55]
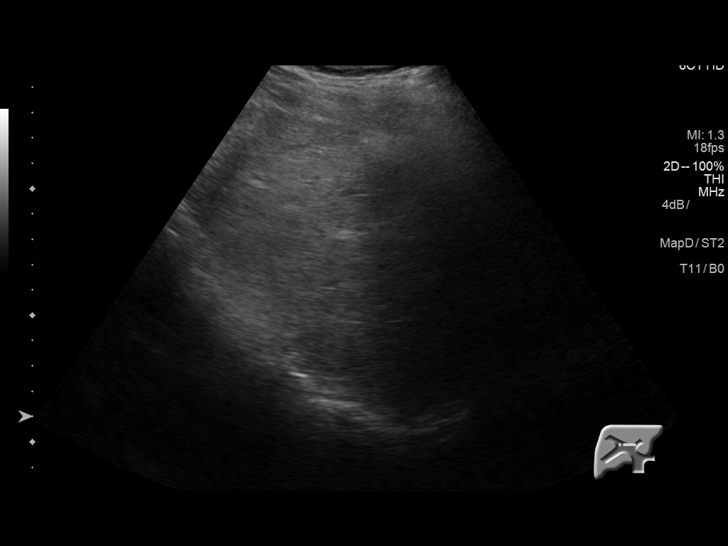
[im 50/55]
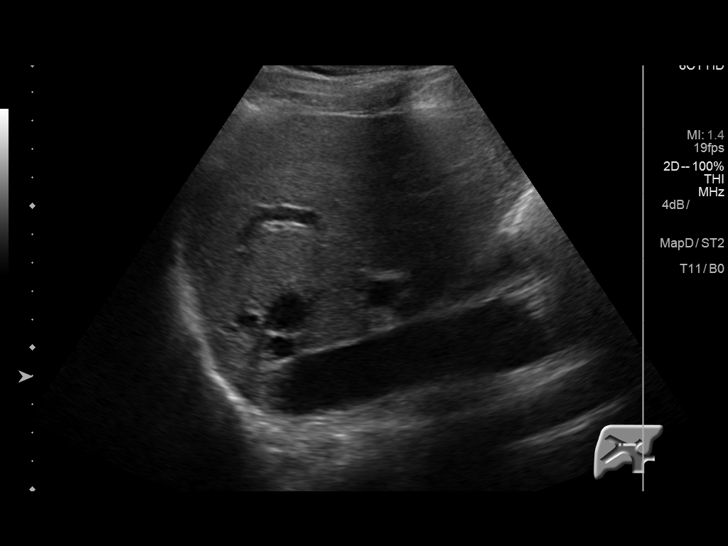
[im 55/55]
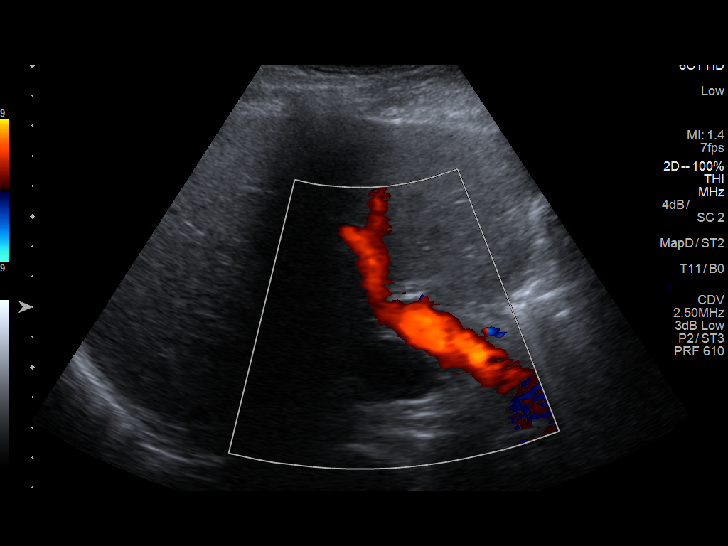

[14 of 25 positions shown; findings below may reference images not displayed]

FINDINGS: Gallbladder:

No gallstones or wall thickening visualized. No sonographic Murphy
sign noted.

Common bile duct:

Diameter: 5 mm

Liver:

Slightly increased hepatic echogenicity diffusely, unchanged. No
focal liver lesion identified.
IMPRESSION: No liver mass identified. Unchanged, slightly increased hepatic
echogenicity diffusely compatible with chronic liver disease.

## 2017-03-07 ENCOUNTER — Ambulatory Visit: Payer: No Typology Code available for payment source | Admitting: Family Medicine

## 2017-03-10 ENCOUNTER — Encounter: Payer: Self-pay | Admitting: Gastroenterology

## 2017-03-10 ENCOUNTER — Ambulatory Visit (INDEPENDENT_AMBULATORY_CARE_PROVIDER_SITE_OTHER): Payer: No Typology Code available for payment source | Admitting: Family Medicine

## 2017-03-10 ENCOUNTER — Encounter: Payer: Self-pay | Admitting: Family Medicine

## 2017-03-10 VITALS — BP 138/80 | Ht 72.0 in | Wt 173.0 lb

## 2017-03-10 DIAGNOSIS — J449 Chronic obstructive pulmonary disease, unspecified: Secondary | ICD-10-CM

## 2017-03-10 DIAGNOSIS — Z79891 Long term (current) use of opiate analgesic: Secondary | ICD-10-CM

## 2017-03-10 DIAGNOSIS — F5101 Primary insomnia: Secondary | ICD-10-CM | POA: Diagnosis not present

## 2017-03-10 MED ORDER — HYDROCODONE-ACETAMINOPHEN 7.5-325 MG PO TABS
1.0000 | ORAL_TABLET | Freq: Four times a day (QID) | ORAL | 0 refills | Status: DC
Start: 2017-03-10 — End: 2017-03-10

## 2017-03-10 MED ORDER — HYDROCODONE-ACETAMINOPHEN 7.5-325 MG PO TABS: 1.0000 | ORAL_TABLET | Freq: Four times a day (QID) | ORAL | 0 refills | Status: DC

## 2017-03-10 NOTE — Progress Notes (Signed)
   Subjective:    Patient ID: Timothy Davis, male    DOB: February 17, 1961, 56 y.o.   MRN: [id number]  HPI This patient was seen today for chronic pain  The medication list was reviewed and updated.   -Compliance with medication:  Takes daily  - Number patient states they take daily: Patient states takes 3 1/2 tablets to 4 tablets per day. -when was the last dose patient took? Last dose taken this morning.   The patient was advised the importance of maintaining medication and not using illegal substances with these.  Refills needed: Yes   The patient was educated that we can provide 3 monthly scripts for their medication, it is their responsibility to follow the instructions.  Side effects or complications from medications: None   Patient is aware that pain medications are meant to minimize the severity of the pain to allow their pain levels to improve to allow for better function. They are aware of that pain medications cannot totally remove their pain.  Due for UDT ( at least once per year) : Today    Not walking much  Patient compliant with pain medication. Continues to experience the pain which led to initiation of analgesic intervention. No significant negative side effects. States definitely needs the pain medication to maintain current level of functioning. Does not receive controlled substance pain medication elsewhere.  Patient compliant with insomnia medication. Generally takes most nights. No obvious morning drowsiness. Definitely helps patient sleep. Without it patient states would not get a good nights rest.  Compliant with stiolto, utilizes the albuterol rtwice per day most days. Substantial shortness of breath with any type of exertion. No chest pain no headache no nausea or diaphoresis. Did work as a Building control surveyor for many years and thinks this had something to do with his ongoing deterioration.   Review of Systems No headache, no major weight loss or weight gain, no chest pain  no back pain abdominal pain no change in bowel habits complete ROS otherwise negative     Objective:   Physical Exam Alert and oriented, vitals reviewed and stable, NAD ENT-TM's and ext canals WNL bilat via otoscopic exam Soft palate, tonsils and post pharynx WNL via oropharyngeal exam Neck-symmetric, no masses; thyroid nonpalpable and nontender Pulmonary-no tachypnea or accessory muscle use; Diminished breath sounds diffusely without wheezes via auscultation Card--no abnrml murmurs, rhythm reg and rate WNL Carotid pulses symmetric, without bruits        Assessment & Plan:  Impression 1 insomnia ongoing with ongoing for the for medications discussed meds refilled next  #2 COPD definite challenge for patient. Preventive agents helping some. Still using albuterol couple times a day, no major exacerbation this time. Importance of exercise discussed in encourage won't with compliance for meds next  #3.pd Impression: Chronic pain. Patient compliant with medication. No substantial side effects. [place] controlled substance registry reviewed to ensure compliance and proper use of medication. Patient aware goal of medicine is not complete resolution of pain but to control his symptoms to improve his functional capacity. Aware of potential adverse side effects

## 2017-03-13 LAB — PLEASE NOTE

## 2017-03-13 LAB — TOXASSURE SELECT 13 (MW), URINE

## 2017-03-28 ENCOUNTER — Ambulatory Visit (INDEPENDENT_AMBULATORY_CARE_PROVIDER_SITE_OTHER): Payer: No Typology Code available for payment source | Admitting: Pulmonary Disease

## 2017-03-28 ENCOUNTER — Encounter: Payer: Self-pay | Admitting: Pulmonary Disease

## 2017-03-28 VITALS — BP 134/78 | HR 66 | Ht 72.0 in | Wt 173.0 lb

## 2017-03-28 DIAGNOSIS — J449 Chronic obstructive pulmonary disease, unspecified: Secondary | ICD-10-CM

## 2017-03-28 MED ORDER — GLYCOPYRROLATE-FORMOTEROL 9-4.8 MCG/ACT IN AERO: 2.0000 | INHALATION_SPRAY | Freq: Two times a day (BID) | RESPIRATORY_TRACT | 0 refills | Status: DC

## 2017-03-28 NOTE — Progress Notes (Signed)
Subjective:    Patient ID: Timothy Davis, male    DOB: 11/01/1960, 56 y.o.   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  HPI Very severe COPD with emphysema: Switched to [employer] at last appointment. Patient also referred to pulmonary rehabilitation at last appointment. Previously patient declined lung transplant referral due to cost. He reports his baseline dyspnea. Chronic wheezing. No significant change in coughing. He decided not to do Pulmonary Rehab for unclear reasons. He reports he was previously walking regularly but hasn't been since he had to have his dog put to sleep. He reports he is only walking 100 yards at a time. He feels the inhaler (Stiolto) wears off around lunch time.   Review of Systems No new chest pain or tightness. No fever or chills. No abdominal pain or nausea.   No Known Allergies  Current Outpatient Prescriptions on File Prior to Visit  Medication Sig Dispense Refill  . HYDROcodone-acetaminophen (NORCO) 7.5-325 MG tablet Take 1 tablet by mouth 4 (four) times daily. 120 tablet 0  . PROAIR HFA 108 (90 Base) MCG/ACT inhaler INHALE 2 PUFFS EVERY 6 HOURS AS NEEDED 8.5 g 5  . Tiotropium Bromide-Olodaterol (STIOLTO RESPIMAT) 2.5-2.5 MCG/ACT AERS Inhale 2 puffs into the lungs daily. 1 Inhaler 0  . zolpidem (AMBIEN) 10 MG tablet TAKE ONE (1) TABLET AT BEDTIME AS NEEDEDFOR SLEEP 30 tablet 5   No current facility-administered medications on file prior to visit.     Past Medical History:  Diagnosis Date  . Asthmatic bronchitis   . Chronic back pain   . COPD (chronic obstructive pulmonary disease) ([place])   . Hepatitis C antibody test positive    noticed trying to give blood  . Insomnia   . Shortness of breath     Past Surgical History:  Procedure Laterality Date  . COLONOSCOPY  04/14/2012   Fields-6 polyps-tubular adenoma x 3 and hyperplastic polyps x 2/internal hemorrhoids/mild diverticulosis in the sigmoid colon, 1CM TA rectum     Family History  Problem Relation Age of Onset  . COPD Mother   . Heart attack Mother   . Asthma Mother   . Lung disease Brother     Social History   Social History  . Marital status: Married    Spouse name: N/A  . Number of children: 2  . Years of education: N/A   Occupational History  . disabled Disabled   Social History Main Topics  . Smoking status: Former Smoker    Packs/day: 1.50    Years: 27.00    Types: Cigarettes    Start date: 12/16/1974    Quit date: 04/23/2002  . Smokeless tobacco: Current User    Types: Chew  . Alcohol use No     Comment: Hx heavy etoh (daily) x 35yrs, QUIT 24yrs ago  . Drug use: No     Comment: Hx marijuana, intranasal drugs, crack, cocaine.  QUIT 5 yrs ago  . Sexual activity: Yes    Partners: Female     Comment: monagamous   Other Topics Concern  . None   Social History Narrative   Lives w/ wife, 2sons      [facility] (08/05/16):   Originally from [place], [place]. Moved to [place] when he was 56 y.o. Since then he has lived in [place]. 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 No known asbestos exposure.  Currently has dogs and a cat. No bird or mold exposure. Enjoys fishing.       Objective:   Physical Exam BP 134/78 (BP Location: Right Arm, Cuff Size: Normal)   Pulse 66   Ht 6' (1.829 m)   Wt 173 lb (78.5 kg)   SpO2 96%   BMI 23.46 kg/m   General:  Accompanied by wife today. Awake. No distress.  Integument:  Warm & dry. No rash on exposed skin. No bruising on exposed skin. Extremities:  No cyanosis HEENT:  No nasal turbinate swelling. No scleral icterus. Moist mucous membranes Cardiovascular:  Regular rate. No edema. Regular rhythm.  Pulmonary:  Symmetrically decreased aeration. Otherwise clear with auscultation. Normal work of breathing on room air. Abdomen: Soft. Normal bowel sounds. Nondistended.  Musculoskeletal:  Normal bulk and tone.  No joint effusion appreciated.  PFT   09/13/16: FVC 2.33 L (44%) FEV1 0.93 L (23%) FEV1/FVC 0.40 FEF 25-75 0.37 L (11%) negative bronchodilator response TLC 7.38 L (6125%] ERV 290% ERV 1% (DLCO could not be performed) 03/05/12: FVC 4.01 L (74%) FEV1 1.00 L (24%) FEV1/FVC 0.25 FEF 25-75 0.22 L (6%) positive bronchodilator response TLC 10.11 L (137%) RV 284% ERV 109% DLCO uncorrected 52%  6MWT 09/18/16:  Walked 343 meters / Baseline Sat 98% on RA / Nadir Sat 96% on RA  IMAGING CXR PA/LAT 03/17/12 (previously reviewed by me): Bilateral linear opacification consistent with scarring and previous chest x-ray imaging. No new mass or opacity appreciated. Hyperinflation with barreling of the chest and deep sulci bilaterally. No pleural effusion or thickening appreciated. Heart normal in size & mediastinum normal in contour.  CT CHEST W/ CONTRAST 9/1/9 (previously reviewed by me): No pleural effusion or thickening. Left rib fracture noted. Apical predominant emphysematous changes. No pathologic mediastinal adenopathy. No pericardial effusion. No parenchymal nodule or opacity other than linear opacity in the apices consistent with scar formation.  LABS 08/05/16 Alpha-1 antitrypsin: MM (155)  08/25/13 ANA:  Negative  03/05/12 ABG on RA:  7.42 / 38 / 85 / 96%  08/28/09 ABG on RA:  7.44 / 38 / 65 / 96%    Assessment & Plan:  56 y.o. male with very severe COPD with emphysema. Discussed patient's severity of lung disease at length. We again addressed his excellent candidacy for consideration of lung transplantation. I explained that he has not at the point where he would need lung transplant now but his disease will likely progress. Given the temporary response to his symptom control with Stiolto he may benefit from a twice daily medication. I instructed the patient to contact my office if he had any new breathing problems or questions before his next appointment.  1. Very severe COPD with emphysema:  Trying patient on Bevespi in place of Stiolto. He will contact me for a prescription if this is more effective. 2. Health maintenance:  Status post Prevnar vaccine January 2018 & Pneumovax August 2013. Recommended influenza vaccine next month. 3. Follow-up: Return to clinic in 6 months or sooner if needed.  Sonia Baller Ashok Cordia, M.D. [facility] Pager:  [phone] After 3pm or if no response, call [phone] 11:40 AM 03/28/17

## 2017-03-28 NOTE — Patient Instructions (Addendum)
   Continue using your albuterol inhaler as needed.  Use the Bevespi sample in place of your Stiolto by doing 2 puffs twice a day.  Call my office for a prescription if you feel this helps your breathing more than the Stiolto.

## 2017-04-28 ENCOUNTER — Telehealth: Payer: Self-pay | Admitting: Pulmonary Disease

## 2017-04-28 MED ORDER — GLYCOPYRROLATE-FORMOTEROL 9-4.8 MCG/ACT IN AERO: 2.0000 | INHALATION_SPRAY | Freq: Two times a day (BID) | RESPIRATORY_TRACT | 5 refills | Status: DC

## 2017-04-28 NOTE — Telephone Encounter (Signed)
Spoke with pt's wife, Lauro Regulus. She states that pt is doing well with Bevespi. Pt would like to have a prescription sent in. Rx has been sent in. Nothing further was needed.

## 2017-06-10 ENCOUNTER — Ambulatory Visit (INDEPENDENT_AMBULATORY_CARE_PROVIDER_SITE_OTHER): Payer: No Typology Code available for payment source | Admitting: Family Medicine

## 2017-06-10 ENCOUNTER — Encounter: Payer: Self-pay | Admitting: Family Medicine

## 2017-06-10 VITALS — BP 122/82 | Ht 72.0 in | Wt 174.8 lb

## 2017-06-10 DIAGNOSIS — J449 Chronic obstructive pulmonary disease, unspecified: Secondary | ICD-10-CM | POA: Diagnosis not present

## 2017-06-10 DIAGNOSIS — M5440 Lumbago with sciatica, unspecified side: Secondary | ICD-10-CM

## 2017-06-10 DIAGNOSIS — Z23 Encounter for immunization: Secondary | ICD-10-CM

## 2017-06-10 DIAGNOSIS — G8929 Other chronic pain: Secondary | ICD-10-CM | POA: Diagnosis not present

## 2017-06-10 DIAGNOSIS — Z125 Encounter for screening for malignant neoplasm of prostate: Secondary | ICD-10-CM | POA: Diagnosis not present

## 2017-06-10 DIAGNOSIS — Z79899 Other long term (current) drug therapy: Secondary | ICD-10-CM | POA: Diagnosis not present

## 2017-06-10 DIAGNOSIS — E785 Hyperlipidemia, unspecified: Secondary | ICD-10-CM | POA: Diagnosis not present

## 2017-06-10 MED ORDER — HYDROCODONE-ACETAMINOPHEN 7.5-325 MG PO TABS: 1.0000 | ORAL_TABLET | Freq: Four times a day (QID) | ORAL | 0 refills | Status: DC

## 2017-06-10 MED ORDER — ZOLPIDEM TARTRATE 10 MG PO TABS: ORAL_TABLET | ORAL | 5 refills | Status: DC

## 2017-06-10 NOTE — Progress Notes (Signed)
   Subjective:    Patient ID: Timothy Davis, male    DOB: Dec 12, 1960, 56 y.o.   MRN: [id number]  HPI  This patient was seen today for chronic pain  The medication list was reviewed and updated.   -Compliance with medication: hydrocodone  - Number patient states they take daily: 4 times a day  -when was the last dose patient took? today  The patient was advised the importance of maintaining medication and not using illegal substances with these.  Refills needed: yes  The patient was educated that we can provide 3 monthly scripts for their medication, it is their responsibility to follow the instructions.  Side effects or complications from medications: none  Patient is aware that pain medications are meant to minimize the severity of the pain to allow their pain levels to improve to allow for better function. They are aware of that pain medications cannot totally remove their pain.  Due for UDT ( at least once per year) : 02/2017   Breathing getting worse with any activity.  Patient more so family frustrated by this.  Now on new chronic inhaler.  But not as much as family  Flu shot [place] rx due   Patient compliant with pain medication. Continues to experience the pain which led to initiation of analgesic intervention. No significant negative side effects. States definitely needs the pain medication to maintain current level of functioning. Does not receive controlled substance pain medication elsewhere.  Patient compliant with insomnia medication. Generally takes most nights. No obvious morning drowsiness. Definitely helps patient sleep. Without it patient states would not get a good nights rest.          Review of Systems No headache, no major weight loss or weight gain, no chest pain no back pain abdominal pain no change in bowel habits complete ROS otherwise negative     Objective:   Physical Exam  Alert and oriented, vitals reviewed and stable,  NAD ENT-TM's and ext canals WNL bilat via otoscopic exam Soft palate, tonsils and post pharynx WNL via oropharyngeal exam Neck-symmetric, no masses; thyroid nonpalpable and nontender Pulmonary-no tachypnea or accessory muscle use; Clear without wheezes via auscultation Card--no abnrml murmurs, rhythm reg and rate WNL Carotid pulses symmetric, without bruits       Assessment & Plan:  Impression 1 COPD with chronic lumbar sacral lungs well.  All pulmonary function declines after 56 years of age in August, but more obviously severe COPD  2.  Insomnia ongoing need for medications medications discussed meds refilled  Impression: Chronic pain. Patient compliant with medication. No substantial side effects. [place] controlled substance registry reviewed to ensure compliance and proper use of medication. Patient aware goal of medicine is not complete resolution of pain but to control his symptoms to improve his functional capacity. Aware of potential adverse side effects

## 2017-06-11 ENCOUNTER — Other Ambulatory Visit: Payer: Self-pay | Admitting: Family Medicine

## 2017-06-11 LAB — LIPID PANEL
CHOLESTEROL: 204 mg/dL — AB (ref <200)
HDL: 42 mg/dL (ref >40)
LDL CHOLESTEROL (CALC): 142 mg/dL — AB
Non-HDL Cholesterol (Calc): 162 mg/dL (calc) — ABNORMAL HIGH (ref <130)
TRIGLYCERIDES: 90 mg/dL (ref <150)
Total CHOL/HDL Ratio: 4.9 (calc) (ref <5.0)

## 2017-06-11 LAB — HEPATIC FUNCTION PANEL
AG Ratio: 1.8 (calc) (ref 1.0–2.5)
ALBUMIN MSPROF: 4.8 g/dL (ref 3.6–5.1)
ALT: 11 U/L (ref 9–46)
AST: 22 U/L (ref 10–35)
Alkaline phosphatase (APISO): 42 U/L (ref 40–115)
BILIRUBIN DIRECT: 0.2 mg/dL (ref 0.0–0.2)
BILIRUBIN INDIRECT: 0.7 mg/dL (ref 0.2–1.2)
BILIRUBIN TOTAL: 0.9 mg/dL (ref 0.2–1.2)
Globulin: 2.7 g/dL (calc) (ref 1.9–3.7)
Total Protein: 7.5 g/dL (ref 6.1–8.1)

## 2017-06-11 LAB — BASIC METABOLIC PANEL WITH GFR
BUN: 18 mg/dL (ref 7–25)
CO2: 30 mmol/L (ref 20–32)
CREATININE: 1.07 mg/dL (ref 0.70–1.33)
Calcium: 9.6 mg/dL (ref 8.6–10.3)
Chloride: 100 mmol/L (ref 98–110)
GFR, EST AFRICAN AMERICAN: 89 mL/min/{1.73_m2} (ref >=60)
GFR, EST NON AFRICAN AMERICAN: 77 mL/min/{1.73_m2} (ref >=60)
GLUCOSE: 108 mg/dL — AB (ref 65–99)
POTASSIUM: 4.6 mmol/L (ref 3.5–5.3)
Sodium: 138 mmol/L (ref 135–146)

## 2017-06-11 LAB — PSA: PSA: 1.6 ng/mL (ref <=4.0)

## 2017-06-12 ENCOUNTER — Encounter: Payer: Self-pay | Admitting: Family Medicine

## 2017-07-08 ENCOUNTER — Other Ambulatory Visit: Payer: Self-pay | Admitting: Family Medicine

## 2017-07-08 NOTE — Telephone Encounter (Signed)
Six mo worth 

## 2017-07-29 ENCOUNTER — Encounter (HOSPITAL_COMMUNITY): Payer: Self-pay

## 2017-07-29 ENCOUNTER — Observation Stay (HOSPITAL_COMMUNITY): Payer: No Typology Code available for payment source

## 2017-07-29 ENCOUNTER — Emergency Department (HOSPITAL_COMMUNITY): Payer: No Typology Code available for payment source

## 2017-07-29 ENCOUNTER — Other Ambulatory Visit: Payer: Self-pay

## 2017-07-29 ENCOUNTER — Observation Stay (HOSPITAL_COMMUNITY)
Admission: EM | Admit: 2017-07-29 | Discharge: 2017-07-30 | Disposition: A | Discharge status: Home / Self Care | Payer: No Typology Code available for payment source | Attending: Internal Medicine | Admitting: Internal Medicine

## 2017-07-29 DIAGNOSIS — M6281 Muscle weakness (generalized): Secondary | ICD-10-CM | POA: Diagnosis not present

## 2017-07-29 DIAGNOSIS — G4700 Insomnia, unspecified: Secondary | ICD-10-CM | POA: Diagnosis not present

## 2017-07-29 DIAGNOSIS — R4182 Altered mental status, unspecified: Secondary | ICD-10-CM | POA: Diagnosis not present

## 2017-07-29 DIAGNOSIS — Z79899 Other long term (current) drug therapy: Secondary | ICD-10-CM | POA: Insufficient documentation

## 2017-07-29 DIAGNOSIS — M549 Dorsalgia, unspecified: Secondary | ICD-10-CM

## 2017-07-29 DIAGNOSIS — R531 Weakness: Secondary | ICD-10-CM | POA: Diagnosis not present

## 2017-07-29 DIAGNOSIS — Z87891 Personal history of nicotine dependence: Secondary | ICD-10-CM | POA: Insufficient documentation

## 2017-07-29 DIAGNOSIS — G8929 Other chronic pain: Secondary | ICD-10-CM | POA: Diagnosis present

## 2017-07-29 DIAGNOSIS — M5440 Lumbago with sciatica, unspecified side: Secondary | ICD-10-CM | POA: Diagnosis not present

## 2017-07-29 DIAGNOSIS — I6523 Occlusion and stenosis of bilateral carotid arteries: Secondary | ICD-10-CM | POA: Diagnosis not present

## 2017-07-29 DIAGNOSIS — J449 Chronic obstructive pulmonary disease, unspecified: Secondary | ICD-10-CM | POA: Diagnosis present

## 2017-07-29 DIAGNOSIS — R471 Dysarthria and anarthria: Secondary | ICD-10-CM | POA: Diagnosis not present

## 2017-07-29 DIAGNOSIS — F5101 Primary insomnia: Secondary | ICD-10-CM | POA: Diagnosis not present

## 2017-07-29 DIAGNOSIS — R410 Disorientation, unspecified: Secondary | ICD-10-CM | POA: Diagnosis not present

## 2017-07-29 DIAGNOSIS — R55 Syncope and collapse: Secondary | ICD-10-CM | POA: Diagnosis not present

## 2017-07-29 DIAGNOSIS — R404 Transient alteration of awareness: Secondary | ICD-10-CM

## 2017-07-29 LAB — URINALYSIS, ROUTINE W REFLEX MICROSCOPIC
Bilirubin Urine: NEGATIVE
GLUCOSE, UA: NEGATIVE mg/dL
Hgb urine dipstick: NEGATIVE
KETONES UR: NEGATIVE mg/dL
LEUKOCYTES UA: NEGATIVE
Nitrite: NEGATIVE
PH: 6 (ref 5.0–8.0)
Protein, ur: NEGATIVE mg/dL
Specific Gravity, Urine: 1.033 — ABNORMAL HIGH (ref 1.005–1.030)

## 2017-07-29 LAB — RAPID URINE DRUG SCREEN, HOSP PERFORMED
Amphetamines: NOT DETECTED
Barbiturates: NOT DETECTED
Benzodiazepines: NOT DETECTED
COCAINE: NOT DETECTED
OPIATES: NOT DETECTED
TETRAHYDROCANNABINOL: NOT DETECTED

## 2017-07-29 LAB — CBC WITH DIFFERENTIAL/PLATELET
Basophils Absolute: 0 10*3/uL (ref 0.0–0.1)
Basophils Relative: 1 %
Eosinophils Absolute: 0.3 10*3/uL (ref 0.0–0.7)
Eosinophils Relative: 7 %
HEMATOCRIT: 42 % (ref 39.0–52.0)
Hemoglobin: 13.6 g/dL (ref 13.0–17.0)
LYMPHS PCT: 35 %
Lymphs Abs: 1.6 10*3/uL (ref 0.7–4.0)
MCH: 30.2 pg (ref 26.0–34.0)
MCHC: 32.4 g/dL (ref 30.0–36.0)
MCV: 93.3 fL (ref 78.0–100.0)
MONO ABS: 0.5 10*3/uL (ref 0.1–1.0)
MONOS PCT: 11 %
Neutro Abs: 2.1 10*3/uL (ref 1.7–7.7)
Neutrophils Relative %: 46 %
Platelets: 174 10*3/uL (ref 150–400)
RBC: 4.5 MIL/uL (ref 4.22–5.81)
RDW: 13.1 % (ref 11.5–15.5)
WBC: 4.6 10*3/uL (ref 4.0–10.5)

## 2017-07-29 LAB — I-STAT CHEM 8, ED
BUN: 15 mg/dL (ref 6–20)
CALCIUM ION: 1.17 mmol/L (ref 1.15–1.40)
CHLORIDE: 102 mmol/L (ref 101–111)
Creatinine, Ser: 1 mg/dL (ref 0.61–1.24)
GLUCOSE: 105 mg/dL — AB (ref 65–99)
HCT: 41 % (ref 39.0–52.0)
Hemoglobin: 13.9 g/dL (ref 13.0–17.0)
Potassium: 3.9 mmol/L (ref 3.5–5.1)
Sodium: 141 mmol/L (ref 135–145)
TCO2: 28 mmol/L (ref 22–32)

## 2017-07-29 LAB — COMPREHENSIVE METABOLIC PANEL
ALT: 16 U/L — AB (ref 17–63)
ANION GAP: 11 (ref 5–15)
AST: 25 U/L (ref 15–41)
Albumin: 4.4 g/dL (ref 3.5–5.0)
Alkaline Phosphatase: 42 U/L (ref 38–126)
BUN: 16 mg/dL (ref 6–20)
CO2: 26 mmol/L (ref 22–32)
CREATININE: 0.98 mg/dL (ref 0.61–1.24)
Calcium: 9.1 mg/dL (ref 8.9–10.3)
Chloride: 103 mmol/L (ref 101–111)
Glucose, Bld: 105 mg/dL — ABNORMAL HIGH (ref 65–99)
Potassium: 3.9 mmol/L (ref 3.5–5.1)
Sodium: 140 mmol/L (ref 135–145)
Total Bilirubin: 0.4 mg/dL (ref 0.3–1.2)
Total Protein: 7.5 g/dL (ref 6.5–8.1)

## 2017-07-29 LAB — I-STAT TROPONIN, ED: Troponin i, poc: 0 ng/mL (ref 0.00–0.08)

## 2017-07-29 LAB — PROTIME-INR
INR: 1.08
PROTHROMBIN TIME: 14 s (ref 11.4–15.2)

## 2017-07-29 LAB — CBG MONITORING, ED: Glucose-Capillary: 107 mg/dL — ABNORMAL HIGH (ref 65–99)

## 2017-07-29 LAB — APTT: aPTT: 27 seconds (ref 24–36)

## 2017-07-29 LAB — TROPONIN I: Troponin I: <0.03 ng/mL (ref <0.03)

## 2017-07-29 LAB — ETHANOL

## 2017-07-29 MED ORDER — SENNOSIDES-DOCUSATE SODIUM 8.6-50 MG PO TABS: 1.0000 | ORAL_TABLET | Freq: Every evening | ORAL | Status: DC | PRN

## 2017-07-29 MED ORDER — ACETAMINOPHEN 160 MG/5ML PO SOLN: 650.0000 mg | ORAL | Status: DC | PRN

## 2017-07-29 MED ORDER — TRAZODONE HCL 50 MG PO TABS
50.0000 mg | ORAL_TABLET | Freq: Every day | ORAL | Status: DC
  Administered 2017-07-29: 50 mg via ORAL
  Filled 2017-07-29: qty 1

## 2017-07-29 MED ORDER — ACETAMINOPHEN 325 MG PO TABS: 650.0000 mg | ORAL_TABLET | ORAL | Status: DC | PRN

## 2017-07-29 MED ORDER — SODIUM CHLORIDE 0.9 % IV SOLN
INTRAVENOUS | Status: DC
  Administered 2017-07-29 – 2017-07-30 (×2): via INTRAVENOUS

## 2017-07-29 MED ORDER — ACETAMINOPHEN 650 MG RE SUPP: 650.0000 mg | RECTAL | Status: DC | PRN

## 2017-07-29 MED ORDER — HYDROCODONE-ACETAMINOPHEN 7.5-325 MG PO TABS: 1.0000 | ORAL_TABLET | Freq: Four times a day (QID) | ORAL | Status: DC | PRN

## 2017-07-29 MED ORDER — ASPIRIN 300 MG RE SUPP: 300.0000 mg | Freq: Every day | RECTAL | Status: DC

## 2017-07-29 MED ORDER — ZOLPIDEM TARTRATE 5 MG PO TABS
10.0000 mg | ORAL_TABLET | Freq: Every day | ORAL | Status: DC
  Administered 2017-07-29: 10 mg via ORAL
  Filled 2017-07-29: qty 2

## 2017-07-29 MED ORDER — ENOXAPARIN SODIUM 40 MG/0.4ML ~~LOC~~ SOLN
40.0000 mg | SUBCUTANEOUS | Status: DC
  Administered 2017-07-29: 40 mg via SUBCUTANEOUS
  Filled 2017-07-29: qty 0.4

## 2017-07-29 MED ORDER — ASPIRIN 325 MG PO TABS
325.0000 mg | ORAL_TABLET | Freq: Every day | ORAL | Status: DC
  Administered 2017-07-29 – 2017-07-30 (×2): 325 mg via ORAL
  Filled 2017-07-29 (×2): qty 1

## 2017-07-29 MED ORDER — IOPAMIDOL (ISOVUE-370) INJECTION 76%
80.0000 mL | Freq: Once | INTRAVENOUS | Status: AC | PRN
  Administered 2017-07-29: 80 mL via INTRAVENOUS

## 2017-07-29 MED ORDER — STROKE: EARLY STAGES OF RECOVERY BOOK
Freq: Once | Status: AC
  Administered 2017-07-29: 17:00:00
  Filled 2017-07-29: qty 1

## 2017-07-29 NOTE — ED Triage Notes (Signed)
Wife reports she heard a thumping noise at 5am this morning and found her husband on the floor.  Reports pt had cut his foot and was confused.  Reports pt was normal when he went to bed last night.

## 2017-07-29 NOTE — ED Notes (Signed)
Teleneurology paged through cart at this time.

## 2017-07-29 NOTE — Plan of Care (Signed)
Progressing

## 2017-07-29 NOTE — ED Provider Notes (Signed)
[facility] EMERGENCY DEPARTMENT Provider Note   CSN: [id number] Arrival date & time: 07/29/17  0706     History   Chief Complaint Chief Complaint  Patient presents with  . Altered Mental Status  . Fall    HPI Timothy Davis is a 57 y.o. male.  Pt presents to the ED today with altered mental status.  Pt and pt's wife sleep in separate rooms.  Pt's wife said she started hearing some thumping around 0500.  She looked around and did not see anything.  She eventually checked on her husband and found him by the door.  The pt had fallen and cut his left foot.  The pt was confused and had difficulty speaking.  Wife and son had to help him get dressed.  He was unable to do it by himself. The pt was normal when he went to bed last night.      Past Medical History:  Diagnosis Date  . Asthmatic bronchitis   . Chronic back pain   . COPD (chronic obstructive pulmonary disease) ([place])   . Hepatitis C antibody test positive    noticed trying to give blood  . Insomnia   . Shortness of breath     Patient Active Problem List   Diagnosis Date Noted  . Altered mental status 07/29/2017  . Pulmonary emphysema ([place]) 08/05/2016  . Hepatic cirrhosis ([place]) 05/05/2014  . Unspecified gastritis and gastroduodenitis without mention of hemorrhage 10/11/2013  . Chronic back pain 01/12/2013  . Insomnia 01/12/2013  . Hepatitis C virus infection without hepatic coma 04/23/2012  . Tubular adenoma of colon 04/23/2012  . 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 DYSPNEA 07/06/2007    Past Surgical History:  Procedure Laterality Date  . COLONOSCOPY  04/14/2012   Fields-6 polyps-tubular adenoma x 3 and hyperplastic polyps x 2/internal hemorrhoids/mild diverticulosis in the sigmoid colon, 1CM TA rectum       Home Medications    Prior to Admission medications   Medication Sig Start Date End Date Taking? Authorizing Provider  Glycopyrrolate-Formoterol (BEVESPI AEROSPHERE) 9-4.8 MCG/ACT AERO Inhale 2  puffs into the lungs 2 (two) times daily. 04/28/17  Yes Javier Glazier, MD  HYDROcodone-acetaminophen [facility]) 7.5-325 MG tablet Take 1 tablet 4 (four) times daily by mouth. 06/10/17  Yes [name], MD  PROAIR HFA 108 [phone] Base) MCG/ACT inhaler INHALE 2 PUFFS EVERY 6 HOURS AS NEEDED 10/03/16  Yes [name], MD  zolpidem (AMBIEN) 10 MG tablet TAKE ONE (1) TABLET AT BEDTIME AS NEEDEDFOR SLEEP 07/08/17  Yes [name], MD    Family History Family History  Problem Relation Age of Onset  . COPD Mother   . Heart attack Mother   . Asthma Mother   . Lung disease Brother     Social History Social History   Tobacco Use  . Smoking status: Former Smoker    Packs/day: 1.50    Years: 27.00    Pack years: 40.50    Types: Cigarettes    Start date: 12/16/1974    Last attempt to quit: 04/23/2002    Years since quitting: 15.2  . Smokeless tobacco: Current User    Types: Chew  Substance Use Topics  . Alcohol use: No    Alcohol/week: 0.0 oz    Comment: Hx heavy etoh (daily) x 66yrs, QUIT 53yrs ago  . Drug use: No    Comment: Hx marijuana, intranasal drugs, crack, cocaine.  QUIT 5 yrs ago  Allergies   Patient has no known allergies.   Review of Systems Review of Systems  Skin: Positive for wound.  Neurological: Positive for speech difficulty and weakness.  All other systems reviewed and are negative.    Physical Exam Updated Vital Signs BP (!) 189/108   Pulse 75   Temp (!) 97.4 F (36.3 C) (Oral)   Resp 19   Ht 6' (1.829 m)   Wt 72.6 kg (160 lb)   SpO2 98%   BMI 21.70 kg/m   Physical Exam  Constitutional: He appears well-developed and well-nourished.  HENT:  Head: Normocephalic and atraumatic.  Right Ear: External ear normal.  Left Ear: External ear normal.  Nose: Nose normal.  Mouth/Throat: Oropharynx is clear and moist.  Eyes: Conjunctivae and EOM are normal. Pupils are equal, round, and reactive to light.  Neck: Normal range of motion. Neck  supple.  Cardiovascular: Normal rate, regular rhythm, normal heart sounds and intact distal pulses.  Pulmonary/Chest: Effort normal and breath sounds normal.  Abdominal: Soft. Bowel sounds are normal.  Musculoskeletal: Normal range of motion.  Neurological: He is alert.  Dysarthria.  Generalized weakness.  Skin: Skin is warm. Capillary refill takes less than 2 seconds.  Small cut to left foot  Nursing note and vitals reviewed.    ED Treatments / Results  Labs (all labs ordered are listed, but only abnormal results are displayed) Labs Reviewed  COMPREHENSIVE METABOLIC PANEL - Abnormal; Notable for the following components:      Result Value   Glucose, Bld 105 (*)    ALT 16 (*)    All other components within normal limits  URINALYSIS, ROUTINE W REFLEX MICROSCOPIC - Abnormal; Notable for the following components:   Color, Urine STRAW (*)    Specific Gravity, Urine 1.033 (*)    All other components within normal limits  CBG MONITORING, ED - Abnormal; Notable for the following components:   Glucose-Capillary 107 (*)    All other components within normal limits  I-STAT CHEM 8, ED - Abnormal; Notable for the following components:   Glucose, Bld 105 (*)    All other components within normal limits  ETHANOL  PROTIME-INR  APTT  RAPID URINE DRUG SCREEN, HOSP PERFORMED  CBC WITH DIFFERENTIAL/PLATELET  TROPONIN I  CBC  DIFFERENTIAL  I-STAT TROPONIN, ED    EKG  EKG Interpretation  Date/Time:  Tuesday July 29 2017 07:15:03 EST Ventricular Rate:  94 PR Interval:    QRS Duration: 106 QT Interval:  374 QTC Calculation: 468 R Axis:   87 Text Interpretation:  Sinus rhythm Prominent P waves, nondiagnostic No old tracing to compare Confirmed by Isla Pence [phone]) on 07/29/2017 7:23:22 AM       Radiology Ct Angio Head W Or Wo Contrast  Result Date: 07/29/2017 CLINICAL DATA:  Found on floor.  Rule out stroke. EXAM: CT ANGIOGRAPHY HEAD AND NECK TECHNIQUE: Multidetector CT  imaging of the head and neck was performed using the standard protocol during bolus administration of intravenous contrast. Multiplanar CT image reconstructions and MIPs were obtained to evaluate the vascular anatomy. Carotid stenosis measurements (when applicable) are obtained utilizing NASCET criteria, using the distal internal carotid diameter as the denominator. CONTRAST:  80mL ISOVUE-370 IOPAMIDOL (ISOVUE-370) INJECTION 76% COMPARISON:  CT head 07/29/2017 FINDINGS: CTA NECK FINDINGS Aortic arch: Mild atherosclerotic disease aortic arch without aneurysm or dissection. Mild atherosclerotic disease proximal great vessels which are widely patent. Right carotid system: Atherosclerotic calcification at the right carotid bifurcation. 15% diameter stenosis right  internal carotid artery Left carotid system: Atherosclerotic calcification proximal left internal carotid artery narrowing the lumen by 30% diameter stenosis. Remainder left internal carotid artery widely patent Vertebral arteries: Both vertebral arteries are patent to the basilar without stenosis. Skeleton: Cervical degenerative change. No acute skeletal abnormality. Other neck: Negative Upper chest: Severe apical emphysema with multiple apical blebs. Soft tissue mass right apex measures 46 x 23 mm with scattered areas of coarse calcification. Left apical irregular mass measures 34 x 17 mm also with several small areas of calcification. Both of these areas are similar prior chest x-rays including March 17, 2012, likely scarring. Review of the MIP images confirms the above findings CTA HEAD FINDINGS Anterior circulation: Atherosclerotic calcification throughout the cavernous carotid bilaterally with moderate stenosis bilaterally. Anterior and middle cerebral arteries patent without stenosis or large vessel occlusion Posterior circulation: Both vertebral arteries widely patent to the basilar. Basilar normal. PICA, superior cerebellar, and posterior cerebral  arteries patent bilaterally. Fetal origin right posterior cerebral artery. Venous sinuses: Patent Anatomic variants: None Delayed phase: Normal enhancement. 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 30% diameter stenosis left internal carotid artery. 15% diameter stenosis right internal carotid artery. Moderate stenosis in the cavernous carotid bilaterally No significant vertebral artery stenosis No significant intracranial stenosis. No emergent large vessel occlusion. 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 Irregular soft tissue mass with calcification in the apices bilaterally, stable since 2013 consistent with scarring. Electronically Signed   By: Franchot Gallo M.D.   On: 07/29/2017 09:06   Ct Head Wo Contrast  Result Date: 07/29/2017 CLINICAL DATA:  Confusion EXAM: CT HEAD WITHOUT CONTRAST TECHNIQUE: Contiguous axial images were obtained from the base of the skull through the vertex without intravenous contrast. COMPARISON:  None. FINDINGS: Brain: No evidence of acute infarction, hemorrhage, hydrocephalus, extra-axial collection or mass lesion/mass effect. Vascular: No hyperdense vessel or unexpected calcification. Skull: Normal. Negative for fracture or focal lesion. Sinuses/Orbits: No acute finding. Other: None. Electronically Signed   By: Inez Catalina M.D.   On: 07/29/2017 07:53   Ct Angio Neck W Or Wo Contrast  Result Date: 07/29/2017 CLINICAL DATA:  Found on floor.  Rule out stroke. EXAM: CT ANGIOGRAPHY HEAD AND NECK TECHNIQUE: Multidetector CT imaging of the head and neck was performed using the standard protocol during bolus administration of intravenous contrast. Multiplanar CT image reconstructions and MIPs were obtained to evaluate the vascular anatomy. Carotid stenosis measurements (when applicable) are obtained utilizing NASCET  criteria, using the distal internal carotid diameter as the denominator. CONTRAST:  60mL ISOVUE-370 IOPAMIDOL (ISOVUE-370) INJECTION 76% COMPARISON:  CT head 07/29/2017 FINDINGS: CTA NECK FINDINGS Aortic arch: Mild atherosclerotic disease aortic arch without aneurysm or dissection. Mild atherosclerotic disease proximal great vessels which are widely patent. Right carotid system: Atherosclerotic calcification at the right carotid bifurcation. 15% diameter stenosis right internal carotid artery Left carotid system: Atherosclerotic calcification proximal left internal carotid artery narrowing the lumen by 30% diameter stenosis. Remainder left internal carotid artery widely patent Vertebral arteries: Both vertebral arteries are patent to the basilar without stenosis. Skeleton: Cervical degenerative change. No acute skeletal abnormality. Other neck: Negative Upper chest: Severe apical emphysema with multiple apical blebs. Soft tissue mass right apex measures 46 x 23 mm with scattered areas of coarse calcification. Left apical irregular mass measures 34 x 17 mm also with several small areas of calcification. Both of these areas are similar  prior chest x-rays including March 17, 2012, likely scarring. Review of the MIP images confirms the above findings CTA HEAD FINDINGS Anterior circulation: Atherosclerotic calcification throughout the cavernous carotid bilaterally with moderate stenosis bilaterally. Anterior and middle cerebral arteries patent without stenosis or large vessel occlusion Posterior circulation: Both vertebral arteries widely patent to the basilar. Basilar normal. PICA, superior cerebellar, and posterior cerebral arteries patent bilaterally. Fetal origin right posterior cerebral artery. Venous sinuses: Patent Anatomic variants: None Delayed phase: Normal enhancement. 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 30% diameter stenosis left internal carotid artery. 15% diameter stenosis right internal carotid artery. Moderate stenosis in the cavernous carotid bilaterally No significant vertebral artery stenosis No significant intracranial stenosis. No emergent large vessel occlusion. 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 Irregular soft tissue mass with calcification in the apices bilaterally, stable since 2013 consistent with scarring. Electronically Signed   By: Franchot Gallo M.D.   On: 07/29/2017 09:06    Procedures Procedures (including critical care time)  Medications Ordered in ED Medications  iopamidol (ISOVUE-370) 76 % injection 80 mL (80 mLs Intravenous Contrast Given 07/29/17 0826)     Initial Impression / Assessment and Plan / ED Course  I have reviewed the triage vital signs and the nursing notes.  Pertinent labs & imaging results that were available during my care of the patient were reviewed by me and considered in my medical decision making (see chart for details).  CRITICAL CARE Performed by: Isla Pence   Total critical care time: 30 minutes  Critical care time was exclusive of separately billable procedures and treating other patients.  Critical care was necessary to treat or prevent imminent or life-threatening deterioration.  Critical care was time spent personally by me on the following activities: development of treatment plan with patient and/or surrogate as well as nursing, discussions with consultants, evaluation of patient's response to treatment, examination of patient, obtaining history from patient or surrogate, ordering and performing treatments and interventions, ordering and review of laboratory studies, ordering and review of radiographic studies, pulse oximetry and re-evaluation of patient's condition.  Time of onset unclear, so a code stroke was not called.  Pt's CT and CTA are ok.  Pt d/w  teleneurology who recommends admission for stroke work up and starting on an antiplatelet.  Pt d/w Dr. Roderic Palau (triad) for admission.       Final Clinical Impressions(s) / ED Diagnoses   Final diagnoses:  Altered mental status, unspecified altered mental status type    ED Discharge Orders    None       Isla Pence, MD 07/29/17 1049

## 2017-07-29 NOTE — ED Notes (Signed)
Pt and pt's family members informed of need for urine sample. Pt states he does not have to go at this time but will inform nursing staff when able to provide sample

## 2017-07-29 NOTE — H&P (Signed)
History and Physical    Timothy Davis:[id number] DOB: [birth year] DOA: 07/29/2017  PCP: [name], MD  Patient coming from: Home  I have personally briefly reviewed patient's old medical records in [place]  Chief Complaint: Altered mental status  HPI: Timothy Davis is a 57 y.o. male with medical history significant of COPD, chronic pain, insomnia, presented to the hospital with altered mental status.  Patient was last seen normal, last night.  This morning, he was found by his wife, having difficulty speaking/slurred speech, very unsteady on his feet and unable to stand patient does not have any recollection of these events.  He has chronic insomnia and takes Ambien.  He reported that he has started taking over-the-counter sleep aid recently.  Overnight he has taken approximately 10 extra pills of sleep aid.  He is not been started on any other new medications.  He has not had any fever, he has had neck pain for the past 2 weeks.  No nausea, vomiting, diarrhea.  He has a chronic cough.  Denies any numbness or tingling.  He may have had blurry vision, although he is unsure.  Since arrival to the emergency room he is feeling better.  ED Course: CT scan of the head and neck were unrevealing.  Labs were also unrevealing.  Patient was seen by tele neurology recommended admission to rule out CVA.  Review of Systems: As per HPI otherwise 10 point review of systems negative.   Past Medical History:  Diagnosis Date  . Asthmatic bronchitis   . Chronic back pain   . COPD (chronic obstructive pulmonary disease) ([place])   . Hepatitis C antibody test positive    noticed trying to give blood  . Insomnia   . Shortness of breath     Past Surgical History:  Procedure Laterality Date  . COLONOSCOPY  04/14/2012   Fields-6 polyps-tubular adenoma x 3 and hyperplastic polyps x 2/internal hemorrhoids/mild diverticulosis in the sigmoid colon, 1CM TA rectum     reports that he quit smoking  about 15 years ago. His smoking use included cigarettes. He started smoking about 42 years ago. He has a 40.50 pack-year smoking history. His smokeless tobacco use includes chew. He reports that he does not drink alcohol or use drugs.  No Known Allergies  Family History  Problem Relation Age of Onset  . COPD Mother   . Heart attack Mother   . Asthma Mother   . Lung disease Brother      Prior to Admission medications   Medication Sig Start Date End Date Taking? Authorizing Provider  Glycopyrrolate-Formoterol (BEVESPI AEROSPHERE) 9-4.8 MCG/ACT AERO Inhale 2 puffs into the lungs 2 (two) times daily. 04/28/17  Yes Javier Glazier, MD  HYDROcodone-acetaminophen [facility] 7.5-325 MG tablet Take 1 tablet 4 (four) times daily by mouth. 06/10/17  Yes [name], MD  PROAIR HFA 108 [phone] Base) MCG/ACT inhaler INHALE 2 PUFFS EVERY 6 HOURS AS NEEDED 10/03/16  Yes [name], MD  zolpidem (AMBIEN) 10 MG tablet TAKE ONE (1) TABLET AT BEDTIME AS NEEDEDFOR SLEEP 07/08/17  Yes [name], MD    Physical Exam: Vitals:   07/29/17 1116 07/29/17 1143 07/29/17 1337 07/29/17 1537  BP:  (!) 182/108 (!) 168/119 (!) 155/114  Pulse:  74 72 69  Resp:  18 18 17   Temp: 97.9 F (36.6 C) 97.9 F (36.6 C) 98.2 F (36.8 C) 97.7 F (36.5 C)  TempSrc:   Oral Oral  SpO2:  99% 98% 98%  Weight:  72.6 kg (160 lb 0 oz)    Height:  6' (1.829 m)      Constitutional: NAD, calm, comfortable Vitals:   07/29/17 1116 07/29/17 1143 07/29/17 1337 07/29/17 1537  BP:  (!) 182/108 (!) 168/119 (!) 155/114  Pulse:  74 72 69  Resp:  18 18 17   Temp: 97.9 F (36.6 C) 97.9 F (36.6 C) 98.2 F (36.8 C) 97.7 F (36.5 C)  TempSrc:   Oral Oral  SpO2:  99% 98% 98%  Weight:  72.6 kg (160 lb 0 oz)    Height:  6' (1.829 m)     Eyes: PERRL, lids and conjunctivae normal ENMT: Mucous membranes are moist. Posterior pharynx clear of any exudate or lesions.Normal dentition.  Neck: normal, supple, no masses, no  thyromegaly Respiratory: clear to auscultation bilaterally, no wheezing, no crackles. Normal respiratory effort. No accessory muscle use.  Cardiovascular: Regular rate and rhythm, no murmurs / rubs / gallops. No extremity edema. 2+ pedal pulses. No carotid bruits.  Abdomen: no tenderness, no masses palpated. No hepatosplenomegaly. Bowel sounds positive.  Musculoskeletal: no clubbing / cyanosis. No joint deformity upper and lower extremities. Good ROM, no contractures. Normal muscle tone.  Skin: no rashes, lesions, ulcers. No induration Neurologic: CN 2-12 grossly intact. Sensation intact, DTR normal. Strength 5/5 in all 4.  Psychiatric: Normal judgment and insight. Alert and oriented x 3. Normal mood.   Labs on Admission: I have personally reviewed following labs and imaging studies  CBC: Recent Labs  Lab 07/29/17 0717 07/29/17 0721  WBC 4.6  --   NEUTROABS 2.1  --   HGB 13.6 13.9  HCT 42.0 41.0  MCV 93.3  --   PLT 174  --    Basic Metabolic Panel: Recent Labs  Lab 07/29/17 0717 07/29/17 0721  NA 140 141  K 3.9 3.9  CL 103 102  CO2 26  --   GLUCOSE 105* 105*  BUN 16 15  CREATININE 0.98 1.00  CALCIUM 9.1  --    GFR: Estimated Creatinine Clearance: 84.7 mL/min (by C-G formula based on SCr of 1 mg/dL). Liver Function Tests: Recent Labs  Lab 07/29/17 0717  AST 25  ALT 16*  ALKPHOS 42  BILITOT 0.4  PROT 7.5  ALBUMIN 4.4   No results for input(s): LIPASE, AMYLASE in the last 168 hours. No results for input(s): AMMONIA in the last 168 hours. Coagulation Profile: Recent Labs  Lab 07/29/17 0717  INR 1.08   Cardiac Enzymes: Recent Labs  Lab 07/29/17 0717  TROPONINI <0.03   BNP (last 3 results) No results for input(s): PROBNP in the last 8760 hours. HbA1C: No results for input(s): HGBA1C in the last 72 hours. CBG: Recent Labs  Lab 07/29/17 0718  GLUCAP 107*   Lipid Profile: No results for input(s): CHOL, HDL, LDLCALC, TRIG, CHOLHDL, LDLDIRECT in the  last 72 hours. Thyroid Function Tests: No results for input(s): TSH, T4TOTAL, FREET4, T3FREE, THYROIDAB in the last 72 hours. Anemia Panel: No results for input(s): VITAMINB12, FOLATE, FERRITIN, TIBC, IRON, RETICCTPCT in the last 72 hours. Urine analysis:    Component Value Date/Time   COLORURINE STRAW (A) 07/29/2017 0714   APPEARANCEUR CLEAR 07/29/2017 0714   LABSPEC 1.033 (H) 07/29/2017 0714   PHURINE 6.0 07/29/2017 0714   GLUCOSEU NEGATIVE 07/29/2017 0714   HGBUR NEGATIVE 07/29/2017 [place] NEGATIVE 07/29/2017 0714   KETONESUR NEGATIVE 07/29/2017 0714   PROTEINUR NEGATIVE 07/29/2017 1027  NITRITE NEGATIVE 07/29/2017 0714   LEUKOCYTESUR NEGATIVE 07/29/2017 2831    Radiological Exams on Admission: Ct Angio Head W Or Wo Contrast  Result Date: 07/29/2017 CLINICAL DATA:  Found on floor.  Rule out stroke. EXAM: CT ANGIOGRAPHY HEAD AND NECK TECHNIQUE: Multidetector CT imaging of the head and neck was performed using the standard protocol during bolus administration of intravenous contrast. Multiplanar CT image reconstructions and MIPs were obtained to evaluate the vascular anatomy. Carotid stenosis measurements (when applicable) are obtained utilizing NASCET criteria, using the distal internal carotid diameter as the denominator. CONTRAST:  29mL ISOVUE-370 IOPAMIDOL (ISOVUE-370) INJECTION 76% COMPARISON:  CT head 07/29/2017 FINDINGS: CTA NECK FINDINGS Aortic arch: Mild atherosclerotic disease aortic arch without aneurysm or dissection. Mild atherosclerotic disease proximal great vessels which are widely patent. Right carotid system: Atherosclerotic calcification at the right carotid bifurcation. 15% diameter stenosis right internal carotid artery Left carotid system: Atherosclerotic calcification proximal left internal carotid artery narrowing the lumen by 30% diameter stenosis. Remainder left internal carotid artery widely patent Vertebral arteries: Both vertebral arteries are patent  to the basilar without stenosis. Skeleton: Cervical degenerative change. No acute skeletal abnormality. Other neck: Negative Upper chest: Severe apical emphysema with multiple apical blebs. Soft tissue mass right apex measures 46 x 23 mm with scattered areas of coarse calcification. Left apical irregular mass measures 34 x 17 mm also with several small areas of calcification. Both of these areas are similar prior chest x-rays including March 17, 2012, likely scarring. Review of the MIP images confirms the above findings CTA HEAD FINDINGS Anterior circulation: Atherosclerotic calcification throughout the cavernous carotid bilaterally with moderate stenosis bilaterally. Anterior and middle cerebral arteries patent without stenosis or large vessel occlusion Posterior circulation: Both vertebral arteries widely patent to the basilar. Basilar normal. PICA, superior cerebellar, and posterior cerebral arteries patent bilaterally. Fetal origin right posterior cerebral artery. Venous sinuses: Patent Anatomic variants: None Delayed phase: Normal enhancement. 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 30% diameter stenosis left internal carotid artery. 15% diameter stenosis right internal carotid artery. Moderate stenosis in the cavernous carotid bilaterally No significant vertebral artery stenosis No significant intracranial stenosis. No emergent large vessel occlusion. 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 Irregular soft tissue mass with calcification in the apices bilaterally, stable since 2013 consistent with scarring. Electronically Signed   By: Franchot Gallo M.D.   On: 07/29/2017 09:06   Ct Head Wo Contrast  Result Date: 07/29/2017 CLINICAL DATA:  Confusion EXAM: CT HEAD WITHOUT CONTRAST TECHNIQUE: Contiguous axial images were obtained from the base of the  skull through the vertex without intravenous contrast. COMPARISON:  None. FINDINGS: Brain: No evidence of acute infarction, hemorrhage, hydrocephalus, extra-axial collection or mass lesion/mass effect. Vascular: No hyperdense vessel or unexpected calcification. Skull: Normal. Negative for fracture or focal lesion. Sinuses/Orbits: No acute finding. Other: None. Electronically Signed   By: Inez Catalina M.D.   On: 07/29/2017 07:53   Ct Angio Neck W Or Wo Contrast  Result Date: 07/29/2017 CLINICAL DATA:  Found on floor.  Rule out stroke. EXAM: CT ANGIOGRAPHY HEAD AND NECK TECHNIQUE: Multidetector CT imaging of the head and neck was performed using the standard protocol during bolus administration of intravenous contrast. Multiplanar CT image reconstructions and MIPs were obtained to evaluate the vascular anatomy. Carotid stenosis measurements (when applicable) are obtained utilizing NASCET criteria, using the distal internal carotid diameter as the denominator.  CONTRAST:  28mL ISOVUE-370 IOPAMIDOL (ISOVUE-370) INJECTION 76% COMPARISON:  CT head 07/29/2017 FINDINGS: CTA NECK FINDINGS Aortic arch: Mild atherosclerotic disease aortic arch without aneurysm or dissection. Mild atherosclerotic disease proximal great vessels which are widely patent. Right carotid system: Atherosclerotic calcification at the right carotid bifurcation. 15% diameter stenosis right internal carotid artery Left carotid system: Atherosclerotic calcification proximal left internal carotid artery narrowing the lumen by 30% diameter stenosis. Remainder left internal carotid artery widely patent Vertebral arteries: Both vertebral arteries are patent to the basilar without stenosis. Skeleton: Cervical degenerative change. No acute skeletal abnormality. Other neck: Negative Upper chest: Severe apical emphysema with multiple apical blebs. Soft tissue mass right apex measures 46 x 23 mm with scattered areas of coarse calcification. Left apical irregular  mass measures 34 x 17 mm also with several small areas of calcification. Both of these areas are similar prior chest x-rays including March 17, 2012, likely scarring. Review of the MIP images confirms the above findings CTA HEAD FINDINGS Anterior circulation: Atherosclerotic calcification throughout the cavernous carotid bilaterally with moderate stenosis bilaterally. Anterior and middle cerebral arteries patent without stenosis or large vessel occlusion Posterior circulation: Both vertebral arteries widely patent to the basilar. Basilar normal. PICA, superior cerebellar, and posterior cerebral arteries patent bilaterally. Fetal origin right posterior cerebral artery. Venous sinuses: Patent Anatomic variants: None Delayed phase: Normal enhancement. 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 30% diameter stenosis left internal carotid artery. 15% diameter stenosis right internal carotid artery. Moderate stenosis in the cavernous carotid bilaterally No significant vertebral artery stenosis No significant intracranial stenosis. No emergent large vessel occlusion. 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 Irregular soft tissue mass with calcification in the apices bilaterally, stable since 2013 consistent with scarring. Electronically Signed   By: Franchot Gallo M.D.   On: 07/29/2017 09:06    EKG: Independently reviewed.  Sinus rhythm without acute changes.  Assessment/Plan Active Problems:   COPD, very severe (HCC)   Chronic back pain   Insomnia   Altered mental status  1. Altered mental status.  Although medication effect is likely cause, TIA/CVA cannot be ruled out.  At present, patient does not have any significant neurologic deficits.  His symptoms have improved since arriving to the emergency room.  Wife reports improvement of symptoms in  approximately 4 hours since onset.  Will check MRI brain, carotid Dopplers and echocardiogram.  Will consult neurology.  Start the patient on aspirin. 2. COPD.  Appears to be stable at this time.  No wheezing or shortness of breath noted.  Breathing comfortably on room air. 3. Chronic back pain.  He is chronically on hydrocodone.  He usually takes 1 tablet/day and took this early in the day yesterday. 4. Insomnia.  Patient takes Ambien and has been taking multiple over-the-counter sleep medications.  Has been advised to keep a sleep journal and work on sleep hygiene.  DVT prophylaxis: Lovenox Code Status: Full code Family Communication: Discussed with wife and son at the bedside Disposition Plan: discharge home once improved Consults called: neurology Admission status: observation, telemetry   Kathie Dike MD [facility] Pager [phone]  If 7PM-7AM, please contact night-coverage [email or web address] Password [email or web address]  07/29/2017, 4:08 PM

## 2017-07-29 NOTE — Consult Note (Addendum)
[facility] TeleNeurology Consult Services     Impression:   RO Acute Ischemic Stroke   RO Encephalopathy    Patient is a pleasant 57 y.o. man presenting with history of slurring of speech with LSN last night, presenting beyond window of time for tPA thrombolytics.  Stroke Alert was not called and I was asked to see patient as STAT consult.   Not a tpa candidate due to: Presentation beyond the window of time for TPA thrombolytics. Presentation is not suggestive of Large Vessel Occlusive Disease. There is no radiographic indication of large vessel occlusive thrombosis. Thrombectomy would not be recommended.   Patient is to be admitted for further stroke workup. Antiplatelet therapy can be started at this time.  In-house neurologist to be consulted. Recommendations to start stroke workup as follows.   Differential Diagnosis:    1. Cardioembolic stroke   2. Small vessel disease/lacune   3. Thromboembolic, artery-to-artery mechanism   4. Hypercoagulable state-related infarct   5. Transient ischemic attack   6. Thrombotic mechanism, large artery disease       Recommendations:     Imaging studies such as MRI of the head may be recommended, as per in-house neurologist routine evaluation.  Antiplatelet therapy to be started at this point.  PT OT ST to evaluate the patient.  Lipid panel  DVD prophylaxis  Further workup as per in-house neurology   Inpatient neurology consultation   Discussed with ED MD   Please call with questions  Burnett Harry, MD [facility] [phone] -----------------------------------------------------------------------------------------    History of Present Illness     Patient is a? pleasant 57 year old man who presents here with history as follows. Patient was found down on the floor apparently has had a fall. Patient's wife states that they heard noise in the room and when they attended to him at 5 AM they noticed that he was  on the floor and that he was slurring his speech. Paragraph he was last seen normal at 9 PM the night before. He appeared to be confused at the time as well and disoriented.  Exam:   1A: Level of Consciousness - Alert; keenly responsive 1B: Ask Month and Age - Both Questions Right 1C: 'Blink Eyes' & 'Squeeze Hands' - Performs Both Tasks 2: Test Horizontal Extraocular Movements - Normal 3: Test Visual Fields - No Visual Loss 4: Test Facial Palsy - Normal symmetry 5A: Test Left Arm Motor Drift - No Drift for 10 Seconds 5B: Test Right Arm Motor Drift - No Drift for 10 Seconds 6A: Test Left Leg Motor Drift - No Drift for 5 Seconds 6B: Test Right Leg Motor Drift - No Drift for 5 Seconds 7: Test Limb Ataxia - No Ataxia 8: Test Sensation - Normal; No sensory loss 9: Test Language/Aphasia - Normal; No aphasia 10: Test Dysarthria - mild slurring of speech.  11: Test Extinction/Inattention - No abnormality  NIHSS 1       Medical Decision Making:    - Extensive number of diagnosis or management options are considered above.  - Extensive amount of complex data reviewed.  - High risk of complication and/or morbidity or mortality are associated with differential diagnostic considerations above.   - There may be Uncertain outcome and increased probability of prolonged functional impairment or high probability of severe prolonged functional impairment associated with some of these differential diagnosis.    Medical Data Reviewed:    1.Data reviewed include clinical labs, radiology,Medical Tests;  2.Tests results discussed w/performing  or interpreting physician;  3.Obtaining/reviewing old medical records;   4.Obtaining case history from another source;   5.Independent review of image, tracing or specimen.         Patient was informed the Neurology Consult would happen via telehealth (remote video) and consented to receiving care in this manner.    No current facility-administered  medications on file prior to encounter.    Current Outpatient Medications on File Prior to Encounter  Medication Sig Dispense Refill  . Glycopyrrolate-Formoterol (BEVESPI AEROSPHERE) 9-4.8 MCG/ACT AERO Inhale 2 puffs into the lungs 2 (two) times daily. 1 Inhaler 5  . HYDROcodone-acetaminophen ([place]) 7.5-325 MG tablet Take 1 tablet 4 (four) times daily by mouth. 120 tablet 0  . PROAIR HFA 108 (90 Base) MCG/ACT inhaler INHALE 2 PUFFS EVERY 6 HOURS AS NEEDED 8.5 g 5  . zolpidem (AMBIEN) 10 MG tablet TAKE ONE (1) TABLET AT BEDTIME AS NEEDEDFOR SLEEP 30 tablet 5   Past Medical History:  Diagnosis Date  . Asthmatic bronchitis   . Chronic back pain   . COPD (chronic obstructive pulmonary disease) ([place])   . Hepatitis C antibody test positive    noticed trying to give blood  . Insomnia   . Shortness of breath

## 2017-07-30 ENCOUNTER — Observation Stay (HOSPITAL_COMMUNITY): Payer: No Typology Code available for payment source

## 2017-07-30 DIAGNOSIS — F5101 Primary insomnia: Secondary | ICD-10-CM

## 2017-07-30 DIAGNOSIS — R4182 Altered mental status, unspecified: Secondary | ICD-10-CM | POA: Diagnosis not present

## 2017-07-30 DIAGNOSIS — G8929 Other chronic pain: Secondary | ICD-10-CM | POA: Diagnosis not present

## 2017-07-30 DIAGNOSIS — J449 Chronic obstructive pulmonary disease, unspecified: Secondary | ICD-10-CM | POA: Diagnosis not present

## 2017-07-30 DIAGNOSIS — M5440 Lumbago with sciatica, unspecified side: Secondary | ICD-10-CM | POA: Diagnosis not present

## 2017-07-30 LAB — LIPID PANEL
CHOLESTEROL: 183 mg/dL (ref 0–200)
HDL: 35 mg/dL — AB (ref >40)
LDL Cholesterol: 129 mg/dL — ABNORMAL HIGH (ref 0–99)
TRIGLYCERIDES: 96 mg/dL (ref <150)
Total CHOL/HDL Ratio: 5.2 RATIO
VLDL: 19 mg/dL (ref 0–40)

## 2017-07-30 LAB — HIV ANTIBODY (ROUTINE TESTING W REFLEX): HIV SCREEN 4TH GENERATION: NONREACTIVE

## 2017-07-30 LAB — HEMOGLOBIN A1C
Hgb A1c MFr Bld: 5.8 % — ABNORMAL HIGH (ref 4.8–5.6)
Mean Plasma Glucose: 119.76 mg/dL

## 2017-07-30 MED ORDER — ZOLPIDEM TARTRATE 5 MG PO TABS: 10.0000 mg | ORAL_TABLET | Freq: Every evening | ORAL | Status: DC | PRN

## 2017-07-30 MED ORDER — GABAPENTIN 300 MG PO CAPS: 600.0000 mg | ORAL_CAPSULE | Freq: Every day | ORAL | Status: DC

## 2017-07-30 MED ORDER — TRAZODONE HCL 50 MG PO TABS: 50.0000 mg | ORAL_TABLET | Freq: Every day | ORAL | 0 refills | Status: DC

## 2017-07-30 MED ORDER — ALBUTEROL SULFATE (2.5 MG/3ML) 0.083% IN NEBU: 2.5000 mg | INHALATION_SOLUTION | Freq: Four times a day (QID) | RESPIRATORY_TRACT | Status: DC | PRN

## 2017-07-30 MED ORDER — ASPIRIN EC 81 MG PO TBEC: 81.0000 mg | DELAYED_RELEASE_TABLET | Freq: Every day | ORAL | 0 refills | Status: DC

## 2017-07-30 NOTE — Progress Notes (Signed)
Back from MRI.  Requesting pta inhalers.  Texted Dr. Roderic Palau to get order for these.

## 2017-07-30 NOTE — Consult Note (Addendum)
Timothy A. Merlene Laughter, MD     [email or web address]          Timothy Davis is an 57 y.o. male.   ASSESSMENT/PLAN: Acute neurological deficits with multiple symptoms are of unclear etiology:  However, given the semiology and the workup so far, I think the patient most likely had a medication effect from his sedatives and other medications.  He in unwitnessed seizures also a concern.   We recommend that the Ambien and the ibuprofen p.m. be discontinued.  He did respond modestly to trazodone and it is reasonable to continue this with low-dose.  Alternatively, gabapentin 600 mg at bedtime could be tried and the can also help with his chronic pain issues.  Chronic insomnia of unclear etiology:  As stated above but also a outpatient polysomnography should be done.  I also recommend brisk walking 30 min 5-7 days a week either in the morning time or even time.  I also recommend that he should be gradually wean himself off caffeinated beverage.  He drinks about one cup of coffee in the morning.  Chronic low back pain:      Patient is a 57 year old right-handed white male who present with acute altered mental status.  The patient was found by his family on the floor in his house.  He was confused dysarthric and ataxic.  He had difficulty standing.  He was noted to have a cut on his toe.  The patient is totally amnestic to what occurred.  He return to baseline after a few hours.  He does not complain of any new symptoms at this time.  He does have chronic low back pain issues and chronic insomnia.  He is on several psychotropic medications for various reasons including insomnia and pain.  The patient takes Ambien and ibuprofen p.m..  He typically takes both of these medications together.  He takes approximately 4-6 tablets of ibuprofen p.m. Along with the Ambien.  It appears that he took more of the ibuprofen the night he had this event.  He took approximately 10 tablets of ibuprofen p.m..   He does not report taking more of other medications.  Review of systems is otherwise negative.    GENERAL:  This is a pleasant male who is in no acute distress.  HEENT:  Neck is supple.  ABDOMEN: Soft  EXTREMITIES: No edema   BACK: Normal alignment.  SKIN: Normal by inspection.    MENTAL STATUS: Alert and oriented including orientation to the month and his age. Speech, language and cognition are generally intact. Judgment and insight normal.   CRANIAL NERVES: Pupils are equal, round and reactive to light and accommodation; extraocular movements are full, there is no significant nystagmus; upper and lower facial muscles are normal in strength and symmetric, there is no flattening of the nasolabial folds; tongue is midline; uvula is midline; shoulder elevation is normal.  Visual fields are full.  MOTOR: Normal tone, bulk and strength; no pronator drift.  There is no drift of the upper lower extremities.  COORDINATION: Left finger to nose is normal, right finger to nose is normal, No rest tremor; no intention tremor; no postural tremor; no bradykinesia.  REFLEXES: Deep tendon reflexes are symmetrical and normal. Plantar responses are flexor bilaterally.   SENSATION: Normal to light touch and temperature.  The there is no neglect on double simultaneous stimulation.  NIH stroke scale 0.     The MRI scans are reviewed and in person and shows no acute findings  on DWI.  No evidence of hemorrhage.  There are a few deep white matter increased signal seen on FLAIR imaging approximately seven of moderate size which is somewhat concerning.     Blood pressure (!) 127/53, pulse 92, temperature 98.2 F (36.8 C), resp. rate 15, height 6' (1.829 m), weight 160 lb 0 oz (72.6 kg), SpO2 94 %.  Past Medical History:  Diagnosis Date  . Asthmatic bronchitis   . Chronic back pain   . COPD (chronic obstructive pulmonary disease) ([place])   . Hepatitis C antibody test positive    noticed trying to give  blood  . Insomnia   . Shortness of breath     Past Surgical History:  Procedure Laterality Date  . COLONOSCOPY  04/14/2012   Fields-6 polyps-tubular adenoma x 3 and hyperplastic polyps x 2/internal hemorrhoids/mild diverticulosis in the sigmoid colon, 1CM TA rectum    Family History  Problem Relation Age of Onset  . COPD Mother   . Heart attack Mother   . Asthma Mother   . Lung disease Brother     Social History:  reports that he quit smoking about 15 years ago. His smoking use included cigarettes. He started smoking about 42 years ago. He has a 40.50 pack-year smoking history. His smokeless tobacco use includes chew. He reports that he does not drink alcohol or use drugs.  Allergies: No Known Allergies  Medications: Prior to Admission medications   Medication Sig Start Date End Date Taking? Authorizing Provider  Glycopyrrolate-Formoterol (BEVESPI AEROSPHERE) 9-4.8 MCG/ACT AERO Inhale 2 puffs into the lungs 2 (two) times daily. 04/28/17  Yes Javier Glazier, MD  HYDROcodone-acetaminophen [facility]) 7.5-325 MG tablet Take 1 tablet 4 (four) times daily by mouth. 06/10/17  Yes [name], MD  PROAIR HFA 108 [phone] Base) MCG/ACT inhaler INHALE 2 PUFFS EVERY 6 HOURS AS NEEDED 10/03/16  Yes [name], MD  zolpidem (AMBIEN) 10 MG tablet TAKE ONE (1) TABLET AT BEDTIME AS NEEDEDFOR SLEEP 07/08/17  Yes [name], MD    Scheduled Meds: . aspirin  300 mg Rectal Daily   Or  . aspirin  325 mg Oral Daily  . enoxaparin (LOVENOX) injection  40 mg Subcutaneous Q24H  . traZODone  50 mg Oral QHS   Continuous Infusions: . sodium chloride 75 mL/hr at 07/30/17 0647   PRN Meds:.acetaminophen **OR** acetaminophen (TYLENOL) oral liquid 160 mg/5 mL **OR** acetaminophen, albuterol, HYDROcodone-acetaminophen, senna-docusate, zolpidem     Results for orders placed or performed during the hospital encounter of 07/29/17 (from the past 48 hour(s))  Urine rapid drug screen (hosp  performed)     Status: None   Collection Time: 07/29/17  7:14 AM  Result Value Ref Range   Opiates NONE DETECTED NONE DETECTED   Cocaine NONE DETECTED NONE DETECTED   Benzodiazepines NONE DETECTED NONE DETECTED   Amphetamines NONE DETECTED NONE DETECTED   Tetrahydrocannabinol NONE DETECTED NONE DETECTED   Barbiturates NONE DETECTED NONE DETECTED    Comment: (NOTE) DRUG SCREEN FOR MEDICAL PURPOSES ONLY.  IF CONFIRMATION IS NEEDED FOR ANY PURPOSE, NOTIFY LAB WITHIN 5 DAYS. LOWEST DETECTABLE LIMITS FOR URINE DRUG SCREEN Drug Class                     Cutoff (ng/mL) Amphetamine and metabolites    1000 Barbiturate and metabolites    200 Benzodiazepine                 431 Tricyclics and metabolites  300 Opiates and metabolites        300 Cocaine and metabolites        300 THC                            50   Urinalysis, Routine w reflex microscopic     Status: Abnormal   Collection Time: 07/29/17  7:14 AM  Result Value Ref Range   Color, Urine STRAW (A) YELLOW   APPearance CLEAR CLEAR   Specific Gravity, Urine 1.033 (H) 1.005 - 1.030   pH 6.0 5.0 - 8.0   Glucose, UA NEGATIVE NEGATIVE mg/dL   Hgb urine dipstick NEGATIVE NEGATIVE   Bilirubin Urine NEGATIVE NEGATIVE   Ketones, ur NEGATIVE NEGATIVE mg/dL   Protein, ur NEGATIVE NEGATIVE mg/dL   Nitrite NEGATIVE NEGATIVE   Leukocytes, UA NEGATIVE NEGATIVE  Ethanol     Status: None   Collection Time: 07/29/17  7:17 AM  Result Value Ref Range   Alcohol, Ethyl (B) <10 <10 mg/dL    Comment:        LOWEST DETECTABLE LIMIT FOR SERUM ALCOHOL IS 10 mg/dL FOR MEDICAL PURPOSES ONLY   Protime-INR     Status: None   Collection Time: 07/29/17  7:17 AM  Result Value Ref Range   Prothrombin Time 14.0 11.4 - 15.2 seconds   INR 1.08   APTT     Status: None   Collection Time: 07/29/17  7:17 AM  Result Value Ref Range   aPTT 27 24 - 36 seconds  Comprehensive metabolic panel     Status: Abnormal   Collection Time: 07/29/17  7:17 AM    Result Value Ref Range   Sodium 140 135 - 145 mmol/L   Potassium 3.9 3.5 - 5.1 mmol/L   Chloride 103 101 - 111 mmol/L   CO2 26 22 - 32 mmol/L   Glucose, Bld 105 (H) 65 - 99 mg/dL   BUN 16 6 - 20 mg/dL   Creatinine, Ser 0.98 0.61 - 1.24 mg/dL   Calcium 9.1 8.9 - 10.3 mg/dL   Total Protein 7.5 6.5 - 8.1 g/dL   Albumin 4.4 3.5 - 5.0 g/dL   AST 25 15 - 41 U/L   ALT 16 (L) 17 - 63 U/L   Alkaline Phosphatase 42 38 - 126 U/L   Total Bilirubin 0.4 0.3 - 1.2 mg/dL   GFR calc non Af Amer >60 >60 mL/min   GFR calc Af Amer >60 >60 mL/min    Comment: (NOTE) The eGFR has been calculated using the CKD EPI equation. This calculation has not been validated in all clinical situations. eGFR's persistently <60 mL/min signify possible Chronic Kidney Disease.    Anion gap 11 5 - 15  CBC WITH DIFFERENTIAL     Status: None   Collection Time: 07/29/17  7:17 AM  Result Value Ref Range   WBC 4.6 4.0 - 10.5 K/uL   RBC 4.50 4.22 - 5.81 MIL/uL   Hemoglobin 13.6 13.0 - 17.0 g/dL   HCT 42.0 39.0 - 52.0 %   MCV 93.3 78.0 - 100.0 fL   MCH 30.2 26.0 - 34.0 pg   MCHC 32.4 30.0 - 36.0 g/dL   RDW 13.1 11.5 - 15.5 %   Platelets 174 150 - 400 K/uL   Neutrophils Relative % 46 %   Neutro Abs 2.1 1.7 - 7.7 K/uL   Lymphocytes Relative 35 %   Lymphs Abs 1.6 0.7 -  4.0 K/uL   Monocytes Relative 11 %   Monocytes Absolute 0.5 0.1 - 1.0 K/uL   Eosinophils Relative 7 %   Eosinophils Absolute 0.3 0.0 - 0.7 K/uL   Basophils Relative 1 %   Basophils Absolute 0.0 0.0 - 0.1 K/uL  Troponin I     Status: None   Collection Time: 07/29/17  7:17 AM  Result Value Ref Range   Troponin I <0.03 <0.03 ng/mL  CBG monitoring, ED     Status: Abnormal   Collection Time: 07/29/17  7:18 AM  Result Value Ref Range   Glucose-Capillary 107 (H) 65 - 99 mg/dL  I-stat troponin, ED     Status: None   Collection Time: 07/29/17  7:18 AM  Result Value Ref Range   Troponin i, poc 0.00 0.00 - 0.08 ng/mL   Comment 3            Comment: Due  to the release kinetics of cTnI, a negative result within the first hours of the onset of symptoms does not rule out myocardial infarction with certainty. If myocardial infarction is still suspected, repeat the test at appropriate intervals.   I-stat chem 8, ed     Status: Abnormal   Collection Time: 07/29/17  7:21 AM  Result Value Ref Range   Sodium 141 135 - 145 mmol/L   Potassium 3.9 3.5 - 5.1 mmol/L   Chloride 102 101 - 111 mmol/L   BUN 15 6 - 20 mg/dL   Creatinine, Ser 1.00 0.61 - 1.24 mg/dL   Glucose, Bld 105 (H) 65 - 99 mg/dL   Calcium, Ion 1.17 1.15 - 1.40 mmol/L   TCO2 28 22 - 32 mmol/L   Hemoglobin 13.9 13.0 - 17.0 g/dL   HCT 41.0 39.0 - 52.0 %  Lipid panel     Status: Abnormal   Collection Time: 07/30/17  4:00 AM  Result Value Ref Range   Cholesterol 183 0 - 200 mg/dL   Triglycerides 96 <150 mg/dL   HDL 35 (L) >40 mg/dL   Total CHOL/HDL Ratio 5.2 RATIO   VLDL 19 0 - 40 mg/dL   LDL Cholesterol 129 (H) 0 - 99 mg/dL    Comment:        Total Cholesterol/HDL:CHD Risk Coronary Heart Disease Risk Table                     Men   Women  1/2 Average Risk   3.4   3.3  Average Risk       5.0   4.4  2 X Average Risk   9.6   7.1  3 X Average Risk  23.4   11.0        Use the calculated Patient Ratio above and the CHD Risk Table to determine the patient's CHD Risk.        ATP III CLASSIFICATION (LDL):  <100     mg/dL   Optimal  100-129  mg/dL   Near or Above                    Optimal  130-159  mg/dL   Borderline  160-189  mg/dL   High  >190     mg/dL   Very High     Studies/Results:  BRAIN MRA FINDINGS: Antegrade flow in the posterior circulation. Fairly codominant distal vertebral arteries. Normal PICA origins. Patent vertebrobasilar junction. Patent basilar artery without stenosis. Normal SCA and left PCA origins. Fetal type right  PCA origin. The left posterior communicating artery is diminutive or absent. Bilateral ACA branches are within normal  limits.  Antegrade flow in both ICA siphons. Siphon irregularity in keeping with the calcified atherosclerosis seen by CTA. No hemodynamically significant stenosis of the right ICA siphon. Mild left ICA supraclinoid stenosis. Normal ophthalmic artery origins. Normal right posterior communicating artery origin. Patent carotid termini.  Normal MCA and ACA origins. Diminutive or absent anterior communicating artery. Visible ACA branches are within normal limits. Early left MCA branching. Visible left MCA branches are within normal limits. Right MCA M1 segment, right MCA bifurcation, and visible right MCA branches are within normal limits.  IMPRESSION: 1. ICA siphon calcified atherosclerosis. Mild left siphon supraclinoid ICA stenosis. 2. Otherwise negative intracranial MRA.     BRAIN MRI FINDINGS: Brain: No restricted diffusion to suggest acute infarction. No midline shift, mass effect, evidence of mass lesion, ventriculomegaly, extra-axial collection or acute intracranial hemorrhage. Cervicomedullary junction and pituitary are within normal limits.  Several scattered and small foci of cerebral white matter T2 and FLAIR hyperintensity are present in the hemispheres. The configuration is nonspecific, and extent is within normal to minimally advanced for age. No cortical encephalomalacia or chronic cerebral blood products identified. Bilateral deep gray matter nuclei, brainstem, and cerebellum appear normal.  Vascular: Major intracranial vascular flow voids are preserved.  Skull and upper cervical spine: Negative visualized cervical spine. Visualized bone marrow signal is within normal limits.  Sinuses/Orbits: Orbits soft tissues appear normal. Paranasal Visualized paranasal sinuses and mastoids are stable and well pneumatized.  Other: Visible internal auditory structures appear normal. Scalp and face soft tissues appear negative.  IMPRESSION: No acute  intracranial abnormality and essentially normal for age noncontrast MRI appearance of the brain.    CAROTID DOPPLERS IMPRESSION: Color duplex indicates minimal heterogeneous and calcified plaque, with no hemodynamically significant stenosis by duplex criteria in the extracranial cerebrovascular circulation.      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 30% diameter stenosis left internal carotid artery. 15% diameter stenosis right internal carotid artery. Moderate stenosis in the cavernous carotid bilaterally  No significant vertebral artery stenosis  No significant intracranial stenosis. No emergent large vessel occlusion.  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 Irregular soft tissue mass with calcification in the apices bilaterally, stable since 2013 consistent with scarring.         Embry Huss A. Merlene Davis, M.D.  Diplomate, Tax adviser of Psychiatry and Neurology ( Neurology). 07/30/2017, 9:01 AM

## 2017-07-30 NOTE — Care Management Obs Status (Signed)
[facility] NOTIFICATION   Patient Details  Name: Timothy Davis MRN: [id number] Date of Birth: 17-Dec-1960   Medicare Observation Status Notification Given:  Yes    Shanel Prazak, Chauncey Reading, RN 07/30/2017, 10:23 AM

## 2017-07-30 NOTE — Evaluation (Signed)
Physical Therapy Evaluation Patient Details Name: Timothy Davis MRN: [id number] DOB: 09-17-1960 Today's Date: 07/30/2017   History of Present Illness  Timothy Davis is a 57 y.o. male with medical history significant of COPD, chronic pain, insomnia, presented to the hospital with altered mental status.  Patient was last seen normal, last night.  This morning, he was found by his wife, having difficulty speaking/slurred speech, very unsteady on his feet and unable to stand patient does not have any recollection of these events.  He has chronic insomnia and takes Ambien.  He reported that he has started taking over-the-counter sleep aid recently.  Overnight he has taken approximately 10 extra pills of sleep aid.  He is not been started on any other new medications.  He has not had any fever, he has had neck pain for the past 2 weeks.  No nausea, vomiting, diarrhea.  He has a chronic cough.  Denies any numbness or tingling.  He may have had blurry vision, although he is unsure.  Since arrival to the emergency room he is feeling better.    Clinical Impression  Patient functioning at baseline for functional mobility and gait.  Patient discharged from physical therapy to care of nursing for ambulation daily as tolerated for length of stay.    Follow Up Recommendations No PT follow up    Equipment Recommendations  None recommended by PT    Recommendations for Other Services       Precautions / Restrictions Precautions Precautions: None Restrictions Weight Bearing Restrictions: No      Mobility  Bed Mobility Overal bed mobility: Independent                Transfers Overall transfer level: Independent                  Ambulation/Gait Ambulation/Gait assistance: Modified independent (Device/Increase time) Ambulation Distance (Feet): 150 Feet Assistive device: None Gait Pattern/deviations: WFL(Within Functional Limits)   Gait velocity interpretation: Below normal speed for  age/gender General Gait Details: grossly WFL, demonstrates slightly slower than normal cadence, on room air and O2 sats at 95-96% while walking  Stairs            Wheelchair Mobility    Modified Rankin (Stroke Patients Only)       Balance Overall balance assessment: No apparent balance deficits (not formally assessed)                                           Pertinent Vitals/Pain Pain Assessment: No/denies pain    Home Living Family/patient expects to be discharged to:: Private residence Living Arrangements: Spouse/significant other;Children Available Help at Discharge: Family Type of Home: House Home Access: Level entry     Home Layout: Two level Home Equipment: None      Prior Function Level of Independence: Independent         Comments: drives, ambuates community distances without assistive device     Hand Dominance        Extremity/Trunk Assessment   Upper Extremity Assessment Upper Extremity Assessment: Overall WFL for tasks assessed    Lower Extremity Assessment Lower Extremity Assessment: Overall WFL for tasks assessed    Cervical / Trunk Assessment Cervical / Trunk Assessment: Normal  Communication   Communication: No difficulties  Cognition Arousal/Alertness: Awake/alert Behavior During Therapy: WFL for tasks assessed/performed Overall Cognitive Status: Within Functional Limits  for tasks assessed                                        General Comments      Exercises     Assessment/Plan    PT Assessment Patent does not need any further PT services  PT Problem List         PT Treatment Interventions      PT Goals (Current goals can be found in the Care Plan section)  Acute Rehab PT Goals Patient Stated Goal: return home PT Goal Formulation: With patient/family Time For Goal Achievement: August 26, 2017 Potential to Achieve Goals: Good    Frequency     Barriers to discharge         Co-evaluation               AM-PAC PT "6 Clicks" Daily Activity  Outcome Measure Difficulty turning over in bed (including adjusting bedclothes, sheets and blankets)?: None Difficulty moving from lying on back to sitting on the side of the bed? : None Difficulty sitting down on and standing up from a chair with arms (e.g., wheelchair, bedside commode, etc,.)?: None Help needed moving to and from a bed to chair (including a wheelchair)?: None Help needed walking in hospital room?: None Help needed climbing 3-5 steps with a railing? : None 6 Click Score: 24    End of Session   Activity Tolerance: Patient tolerated treatment well Patient left: in bed;with call bell/phone within reach;with family/visitor present Nurse Communication: Mobility status PT Visit Diagnosis: Unsteadiness on feet (R26.81);Other abnormalities of gait and mobility (R26.89);Muscle weakness (generalized) (M62.81)    Time: 6333-5456 PT Time Calculation (min) (ACUTE ONLY): 22 min   Charges:   PT Evaluation $PT Eval Low Complexity: 1 Low PT Treatments $Gait Training: 8-22 mins   PT G Codes:        11:08 AM, 2017-08-26 Lonell Grandchild, MPT Physical Therapist with [facility] [phone] office [phone] mobile phone

## 2017-07-30 NOTE — Progress Notes (Signed)
Showing no signs of stroke.  Ambulated in hall with p therapy and denies dizzines or lightheadedness.  Called resp therapy earlier for neb tx due to chronic copd.

## 2017-07-30 NOTE — Progress Notes (Signed)
IV removed and discharge instructions with Follow up appts given.  New scripts sent electronically to pharmacy.  Wife to drive home

## 2017-07-30 NOTE — Progress Notes (Signed)
OT Cancellation Note  Patient Details Name: Timothy Davis MRN: [id number] DOB: 1961-07-08   Cancelled Treatment:    Reason Eval/Treat Not Completed: OT screened, no needs identified, will sign off. Pt is at baseline with B/ADLs at this time. No further OT needs.   Guadelupe Sabin, OTR/L  [phone] 07/30/2017, 8:47 AM

## 2017-07-30 NOTE — Discharge Summary (Signed)
Physician Discharge Summary  DIRON HADDON KAJ:[id number] DOB: [birth year] DOA: 07/29/2017  PCP: [name], MD  Admit date: 07/29/2017 Discharge date: 07/30/2017  Admitted From: Home Disposition: Home  Recommendations for Outpatient Follow-up:  1. Follow up with PCP in 1-2 weeks 2. Please obtain BMP/CBC in one week 3. Outpatient EEG and sleep study  Home Health: Equipment/Devices:   Discharge Condition: Stable CODE STATUS: Full code Diet recommendation: Heart Healthy  Brief/Interim Summary: 57 year old male with a history of COPD, chronic pain, insomnia, presented to the hospital with complaints of altered mental status.  He was last seen normal the night prior to admission.  He was found to have difficulty speaking/slurred speech and was unsteady on his feet.  He chronically takes Ambien for insomnia.  He recently started taking an over-the-counter sleep aid.  On the night prior to admission, he had taken approximately 10 extra pills of sleep aid.  He presented to the emergency room and was admitted for further workup.  MRI of the brain did not show any acute stroke.  MRA head was also unremarkable as were carotid Dopplers.  He was seen by neurology who felt that his symptoms were related to medications.  He was advised not to take any further over-the-counter sleep aids.  Ambien has also been discontinued.  He did receive trazodone in the hospital and reported improvement of his sleep.  He will be prescribed this on discharge.  He will be scheduled for an outpatient EEG and will also be referred for an outpatient sleep study.  He is also been started on a baby aspirin.  Blood pressure was noted to be elevated on admission, but has since normalized.  We will not be started on any medications for this at this time.  Patient is otherwise feeling better and wants to discharge home.  He has been cleared by neurology for discharge.  He will follow-up with neurology in 4 weeks.  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   Glycopyrrolate-Formoterol 9-4.8 MCG/ACT Aero Commonly known as:  BEVESPI AEROSPHERE Inhale 2 puffs into the lungs 2 (two) times daily.   HYDROcodone-acetaminophen 7.5-325 MG tablet Commonly known as:  NORCO Take 1 tablet 4 (four) times daily by mouth.   PROAIR HFA 108 (90 Base) MCG/ACT inhaler Generic drug:  albuterol INHALE 2 PUFFS EVERY 6 HOURS AS NEEDED   traZODone 50 MG tablet Commonly known as:  DESYREL Take 1 tablet (50 mg total) by mouth at bedtime.       No Known Allergies  Consultations:  Neurology   Procedures/Studies: Ct Angio Head W Or Wo Contrast  Result Date: 07/29/2017 CLINICAL DATA:  Found on floor.  Rule out stroke. EXAM: CT ANGIOGRAPHY HEAD AND NECK TECHNIQUE: Multidetector CT imaging of the head and neck was performed using the standard protocol during bolus  administration of intravenous contrast. Multiplanar CT image reconstructions and MIPs were obtained to evaluate the vascular anatomy. Carotid stenosis measurements (when applicable) are obtained utilizing NASCET criteria, using the distal internal carotid diameter as the denominator. CONTRAST:  61mL ISOVUE-370 IOPAMIDOL (ISOVUE-370) INJECTION 76% COMPARISON:  CT head 07/29/2017 FINDINGS: CTA NECK FINDINGS Aortic arch: Mild atherosclerotic disease aortic arch without aneurysm or dissection. Mild atherosclerotic disease proximal great vessels which are widely  patent. Right carotid system: Atherosclerotic calcification at the right carotid bifurcation. 15% diameter stenosis right internal carotid artery Left carotid system: Atherosclerotic calcification proximal left internal carotid artery narrowing the lumen by 30% diameter stenosis. Remainder left internal carotid artery widely patent Vertebral arteries: Both vertebral arteries are patent to the basilar without stenosis. Skeleton: Cervical degenerative change. No acute skeletal abnormality. Other neck: Negative Upper chest: Severe apical emphysema with multiple apical blebs. Soft tissue mass right apex measures 46 x 23 mm with scattered areas of coarse calcification. Left apical irregular mass measures 34 x 17 mm also with several small areas of calcification. Both of these areas are similar prior chest x-rays including March 17, 2012, likely scarring. Review of the MIP images confirms the above findings CTA HEAD FINDINGS Anterior circulation: Atherosclerotic calcification throughout the cavernous carotid bilaterally with moderate stenosis bilaterally. Anterior and middle cerebral arteries patent without stenosis or large vessel occlusion Posterior circulation: Both vertebral arteries widely patent to the basilar. Basilar normal. PICA, superior cerebellar, and posterior cerebral arteries patent bilaterally. Fetal origin right posterior cerebral artery. Venous sinuses: Patent Anatomic variants: None Delayed phase: Normal enhancement. 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 30% diameter stenosis left internal carotid artery. 15% diameter stenosis right internal carotid artery. Moderate stenosis in the cavernous carotid bilaterally No significant vertebral artery stenosis No significant intracranial stenosis. No emergent large vessel occlusion. Severe apical emphysema  and and bleb formation. Irregular soft tissue mass with calcification in the apices bilaterally, stable since 2013 consistent with scarring. Electronically Signed   By: Franchot Gallo M.D.   On: 07/29/2017 09:06   Ct Head Wo Contrast  Result Date: 07/29/2017 CLINICAL DATA:  Confusion EXAM: CT HEAD WITHOUT CONTRAST TECHNIQUE: Contiguous axial images were obtained from the base of the skull through the vertex without intravenous contrast. COMPARISON:  None. FINDINGS: Brain: No evidence of acute infarction, hemorrhage, hydrocephalus, extra-axial collection or mass lesion/mass effect. Vascular: No hyperdense vessel or unexpected calcification. Skull: Normal. Negative for fracture or focal lesion. Sinuses/Orbits: No acute finding. Other: None. Electronically Signed   By: Inez Catalina M.D.   On: 07/29/2017 07:53   Ct Angio Neck W Or Wo Contrast  Result Date: 07/29/2017 CLINICAL DATA:  Found on floor.  Rule out stroke. EXAM: CT ANGIOGRAPHY HEAD AND NECK TECHNIQUE: Multidetector CT imaging of the head and neck was performed using the standard protocol during bolus administration of intravenous contrast. Multiplanar CT image reconstructions and MIPs were obtained to evaluate the vascular anatomy. Carotid stenosis measurements (when applicable) are obtained utilizing NASCET criteria, using the distal internal carotid diameter as the denominator. CONTRAST:  51mL ISOVUE-370 IOPAMIDOL (ISOVUE-370) INJECTION 76% COMPARISON:  CT head 07/29/2017 FINDINGS: CTA NECK FINDINGS Aortic arch: Mild atherosclerotic disease aortic arch without aneurysm or dissection. Mild atherosclerotic disease proximal great vessels which are widely patent. Right carotid system: Atherosclerotic calcification at the right carotid bifurcation. 15% diameter stenosis right internal carotid artery Left carotid system: Atherosclerotic calcification proximal  left internal carotid artery narrowing the lumen by 30% diameter stenosis. Remainder left internal  carotid artery widely patent Vertebral arteries: Both vertebral arteries are patent to the basilar without stenosis. Skeleton: Cervical degenerative change. No acute skeletal abnormality. Other neck: Negative Upper chest: Severe apical emphysema with multiple apical blebs. Soft tissue mass right apex measures 46 x 23 mm with scattered areas of coarse calcification. Left apical irregular mass measures 34 x 17 mm also with several small areas of calcification. Both of these areas are similar prior chest x-rays including March 17, 2012, likely scarring. Review of the MIP images confirms the above findings CTA HEAD FINDINGS Anterior circulation: Atherosclerotic calcification throughout the cavernous carotid bilaterally with moderate stenosis bilaterally. Anterior and middle cerebral arteries patent without stenosis or large vessel occlusion Posterior circulation: Both vertebral arteries widely patent to the basilar. Basilar normal. PICA, superior cerebellar, and posterior cerebral arteries patent bilaterally. Fetal origin right posterior cerebral artery. Venous sinuses: Patent Anatomic variants: None Delayed phase: Normal enhancement. 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 30% diameter stenosis left internal carotid artery. 15% diameter stenosis right internal carotid artery. Moderate stenosis in the cavernous carotid bilaterally No significant vertebral artery stenosis No significant intracranial stenosis. No emergent large vessel occlusion. 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 Irregular soft tissue mass with calcification in the apices bilaterally, stable since 2013 consistent with scarring. Electronically Signed   By: Franchot Gallo M.D.   On: 07/29/2017 09:06   Mr Brain Wo Contrast  Result Date: 07/30/2017 CLINICAL DATA:  57 year old male with  altered mental status and weakness for 1 day. EXAM: MRI HEAD WITHOUT CONTRAST TECHNIQUE: Multiplanar, multiecho pulse sequences of the brain and surrounding structures were obtained without intravenous contrast. COMPARISON:  CT head and CTA head and neck 07/29/2017 FINDINGS: Brain: No restricted diffusion to suggest acute infarction. No midline shift, mass effect, evidence of mass lesion, ventriculomegaly, extra-axial collection or acute intracranial hemorrhage. Cervicomedullary junction and pituitary are within normal limits. Several scattered and small foci of cerebral white matter T2 and FLAIR hyperintensity are present in the hemispheres. The configuration is nonspecific, and extent is within normal to minimally advanced for age. No cortical encephalomalacia or chronic cerebral blood products identified. Bilateral deep gray matter nuclei, brainstem, and cerebellum appear normal. Vascular: Major intracranial vascular flow voids are preserved. Skull and upper cervical spine: Negative visualized cervical spine. Visualized bone marrow signal is within normal limits. Sinuses/Orbits: Orbits soft tissues appear normal. Paranasal Visualized paranasal sinuses and mastoids are stable and well pneumatized. Other: Visible internal auditory structures appear normal. Scalp and face soft tissues appear negative. IMPRESSION: No acute intracranial abnormality and essentially normal for age noncontrast MRI appearance of the brain. Electronically Signed   By: Genevie Ann M.D.   On: 07/30/2017 08:22   US Carotid Bilateral (at [facility] And [facility] Only)  Result Date: 07/30/2017 CLINICAL DATA:  57 year old male with a history of TIA. Cardiovascular risk factors include known prior stroke/TIA EXAM: BILATERAL CAROTID DUPLEX ULTRASOUND TECHNIQUE: Pearline Cables scale imaging, color Doppler and duplex ultrasound were performed of bilateral carotid and vertebral arteries in the neck. COMPARISON:  No prior duplex FINDINGS: Criteria: Quantification of carotid  stenosis is based on velocity parameters that correlate the residual internal carotid diameter with NASCET-based stenosis levels, using the diameter of the distal internal carotid lumen as the denominator for stenosis measurement. The following velocity measurements were obtained:  RIGHT ICA:  Systolic 856 cm/sec, Diastolic 43 cm/sec CCA:  1.4 cm/sec SYSTOLIC ICA/CCA RATIO:  77 ECA:  104 cm/sec LEFT ICA:  Systolic 86 cm/sec, Diastolic 15 cm/sec CCA:  70 cm/sec SYSTOLIC ICA/CCA RATIO:  0.2 ECA:  131 cm/sec Right Brachial SBP: Not acquired Left Brachial SBP: Not acquired RIGHT CAROTID ARTERY: No significant calcifications of the right common carotid artery. Intermediate waveform maintained. Heterogeneous and partially calcified plaque at the right carotid bifurcation. No significant lumen shadowing. Low resistance waveform of the right ICA. No significant tortuosity. RIGHT VERTEBRAL ARTERY: Antegrade flow with low resistance waveform. LEFT CAROTID ARTERY: No significant calcifications of the left common carotid artery. Intermediate waveform maintained. Heterogeneous and partially calcified plaque at the left carotid bifurcation without significant lumen shadowing. Low resistance waveform of the left ICA. No significant tortuosity. LEFT VERTEBRAL ARTERY:  Antegrade flow with low resistance waveform. IMPRESSION: Color duplex indicates minimal heterogeneous and calcified plaque, with no hemodynamically significant stenosis by duplex criteria in the extracranial cerebrovascular circulation. Signed, Dulcy Fanny. Earleen Newport, DO [facility] Electronically Signed   By: Corrie Mckusick D.O.   On: 07/30/2017 07:06   Mr Jodene Nam Head/brain DJ Cm  Result Date: 07/30/2017 CLINICAL DATA:  57 year old male with altered mental status and weakness for 1 day. EXAM: MRA HEAD WITHOUT CONTRAST TECHNIQUE: Angiographic images of the Circle of Willis were obtained using MRA technique without  intravenous contrast. COMPARISON:  Brain MRI today reported separately. CTA head and neck 07/29/2017. FINDINGS: Antegrade flow in the posterior circulation. Fairly codominant distal vertebral arteries. Normal PICA origins. Patent vertebrobasilar junction. Patent basilar artery without stenosis. Normal SCA and left PCA origins. Fetal type right PCA origin. The left posterior communicating artery is diminutive or absent. Bilateral ACA branches are within normal limits. Antegrade flow in both ICA siphons. Siphon irregularity in keeping with the calcified atherosclerosis seen by CTA. No hemodynamically significant stenosis of the right ICA siphon. Mild left ICA supraclinoid stenosis. Normal ophthalmic artery origins. Normal right posterior communicating artery origin. Patent carotid termini. Normal MCA and ACA origins. Diminutive or absent anterior communicating artery. Visible ACA branches are within normal limits. Early left MCA branching. Visible left MCA branches are within normal limits. Right MCA M1 segment, right MCA bifurcation, and visible right MCA branches are within normal limits. IMPRESSION: 1. ICA siphon calcified atherosclerosis. Mild left siphon supraclinoid ICA stenosis. 2. Otherwise negative intracranial MRA. Electronically Signed   By: Genevie Ann M.D.   On: 07/30/2017 08:25       Subjective: Feeling better.  No difficulty with speech.  Slept better last night.  No trouble ambulating.  Discharge Exam: Vitals:   07/30/17 0130 07/30/17 0930  BP: (!) 127/53 126/75  Pulse: 92 70  Resp: 15 16  Temp: 98.2 F (36.8 C) 97.7 F (36.5 C)  SpO2: 94% 99%   Vitals:   07/29/17 2115 07/29/17 2330 07/30/17 0130 07/30/17 0930  BP: (!) 135/59 129/81 (!) 127/53 126/75  Pulse: 79 91 92 70  Resp: 17 18 15 16   Temp: 98.5 F (36.9 C) 97.7 F (36.5 C) 98.2 F (36.8 C) 97.7 F (36.5 C)  TempSrc: Oral   Oral  SpO2: 98% 95% 94% 99%  Weight:      Height:        General: Pt is alert, awake, not in  acute distress Cardiovascular: RRR, S1/S2 +, no rubs, no gallops Respiratory: CTA bilaterally, no wheezing, no rhonchi Abdominal: Soft, NT, ND, bowel sounds + Extremities:  no edema, no cyanosis    The results of significant diagnostics from this hospitalization (including imaging, microbiology, ancillary and laboratory) are listed below for reference.     Microbiology: No results found for this or any previous visit (from the past 240 hour(s)).   Labs: BNP (last 3 results) No results for input(s): BNP in the last 8760 hours. Basic Metabolic Panel: Recent Labs  Lab 07/29/17 0717 07/29/17 0721  NA 140 141  K 3.9 3.9  CL 103 102  CO2 26  --   GLUCOSE 105* 105*  BUN 16 15  CREATININE 0.98 1.00  CALCIUM 9.1  --    Liver Function Tests: Recent Labs  Lab 07/29/17 0717  AST 25  ALT 16*  ALKPHOS 42  BILITOT 0.4  PROT 7.5  ALBUMIN 4.4   No results for input(s): LIPASE, AMYLASE in the last 168 hours. No results for input(s): AMMONIA in the last 168 hours. CBC: Recent Labs  Lab 07/29/17 0717 07/29/17 0721  WBC 4.6  --   NEUTROABS 2.1  --   HGB 13.6 13.9  HCT 42.0 41.0  MCV 93.3  --   PLT 174  --    Cardiac Enzymes: Recent Labs  Lab 07/29/17 0717  TROPONINI <0.03   BNP: Invalid input(s): POCBNP CBG: Recent Labs  Lab 07/29/17 0718  GLUCAP 107*   D-Dimer No results for input(s): DDIMER in the last 72 hours. Hgb A1c Recent Labs    07/30/17 0400  HGBA1C 5.8*   Lipid Profile Recent Labs    07/30/17 0400  CHOL 183  HDL 35*  LDLCALC 129*  TRIG 96  CHOLHDL 5.2   Thyroid function studies No results for input(s): TSH, T4TOTAL, T3FREE, THYROIDAB in the last 72 hours.  Invalid input(s): FREET3 Anemia work up No results for input(s): VITAMINB12, FOLATE, FERRITIN, TIBC, IRON, RETICCTPCT in the last 72 hours. Urinalysis    Component Value Date/Time   COLORURINE STRAW (A) 07/29/2017 0714   APPEARANCEUR CLEAR 07/29/2017 0714   LABSPEC 1.033 (H)  07/29/2017 0714   PHURINE 6.0 07/29/2017 0714   GLUCOSEU NEGATIVE 07/29/2017 0714   HGBUR NEGATIVE 07/29/2017 0714   BILIRUBINUR NEGATIVE 07/29/2017 0714   KETONESUR NEGATIVE 07/29/2017 0714   PROTEINUR NEGATIVE 07/29/2017 0714   NITRITE NEGATIVE 07/29/2017 0714   LEUKOCYTESUR NEGATIVE 07/29/2017 0714   Sepsis Labs Invalid input(s): PROCALCITONIN,  WBC,  LACTICIDVEN Microbiology No results found for this or any previous visit (from the past 240 hour(s)).   Time coordinating discharge: Over 30 minutes  SIGNED:   Kathie Dike, MD  [facility] 07/30/2017, 1:50 PM Pager   If 7PM-7AM, please contact night-coverage [email or web address] Password [email or web address]

## 2017-07-31 ENCOUNTER — Other Ambulatory Visit (HOSPITAL_COMMUNITY): Payer: No Typology Code available for payment source

## 2017-07-31 ENCOUNTER — Ambulatory Visit (HOSPITAL_COMMUNITY)
Admission: RE | Admit: 2017-07-31 | Discharge: 2017-07-31 | Disposition: A | Discharge status: Home / Self Care | Payer: No Typology Code available for payment source | Source: Ambulatory Visit | Attending: Neurology | Admitting: Neurology

## 2017-07-31 ENCOUNTER — Other Ambulatory Visit: Payer: Self-pay | Admitting: Neurology

## 2017-07-31 ENCOUNTER — Telehealth: Payer: Self-pay | Admitting: Family Medicine

## 2017-07-31 DIAGNOSIS — G4700 Insomnia, unspecified: Secondary | ICD-10-CM | POA: Diagnosis not present

## 2017-07-31 DIAGNOSIS — J449 Chronic obstructive pulmonary disease, unspecified: Secondary | ICD-10-CM | POA: Diagnosis not present

## 2017-07-31 DIAGNOSIS — R4182 Altered mental status, unspecified: Secondary | ICD-10-CM

## 2017-07-31 DIAGNOSIS — Z79899 Other long term (current) drug therapy: Secondary | ICD-10-CM | POA: Insufficient documentation

## 2017-07-31 NOTE — Telephone Encounter (Signed)
Requesting referral to Dr. Freddie Apley office for a sleep study.  This was recommended by the hospital during his recent visit to have in about 4 weeks.

## 2017-07-31 NOTE — Telephone Encounter (Signed)
Will work [facility] when he comes in for f u visit/ see messager to you re f u need to see pt early next wk

## 2017-07-31 NOTE — Telephone Encounter (Signed)
pls advise

## 2017-07-31 NOTE — Progress Notes (Signed)
EEG completed, results pending. 

## 2017-08-01 NOTE — Procedures (Signed)
  [place] A. Merlene Laughter, MD     [email or web address]           HISTORY: The patient is a 57 year old man who presents with the confusion and altered mental status.  This has been done to evaluate for seizure as the etiology.   MEDICATIONS: Scheduled Meds: Continuous Infusions: PRN Meds:.  Prior to Admission medications   Medication Sig Start Date End Date Taking? Authorizing Provider  aspirin EC 81 MG tablet Take 1 tablet (81 mg total) by mouth daily. 07/30/17   Kathie Dike, MD  Glycopyrrolate-Formoterol (BEVESPI AEROSPHERE) 9-4.8 MCG/ACT AERO Inhale 2 puffs into the lungs 2 (two) times daily. 04/28/17   Javier Glazier, MD  HYDROcodone-acetaminophen [facility]) 7.5-325 MG tablet Take 1 tablet 4 (four) times daily by mouth. 06/10/17   [name], MD  PROAIR HFA 108 [phone] Base) MCG/ACT inhaler INHALE 2 PUFFS EVERY 6 HOURS AS NEEDED 10/03/16   [name], MD  traZODone (DESYREL) 50 MG tablet Take 1 tablet (50 mg total) by mouth at bedtime. 07/30/17   Kathie Dike, MD      ANALYSIS: A 16 channel recording using standard 10 20 measurements is conducted for 21 minutes.  Well-formed posterior dominant rhythm of 9.5 hertz is observed.  This activity is attenuated with eye opening.  There is beta activity observed in the frontal areas.  Awake and drowsy activities are observed.  There is transient bilateral temporal sharp wave activities thought to be nonepileptic.  Photic stimulation is carried out without abnormal changes in the background activity.  Hyperventilation is a conductive because of underlying pulmonary disease.  There is no no focal or lateralized slowing epileptiform activities are not observed.   IMPRESSION: 1.   This is a normal recording of awake and drowsy states.     Loyce Klasen A. Merlene Laughter, M.D.  Diplomate, Tax adviser of Psychiatry and Neurology ( Neurology).  +

## 2017-08-01 NOTE — Telephone Encounter (Signed)
Patient has follow up office visit 08/06/17

## 2017-08-01 NOTE — Telephone Encounter (Signed)
Left message to return call 

## 2017-08-06 ENCOUNTER — Ambulatory Visit (INDEPENDENT_AMBULATORY_CARE_PROVIDER_SITE_OTHER): Payer: No Typology Code available for payment source | Admitting: Family Medicine

## 2017-08-06 ENCOUNTER — Encounter: Payer: Self-pay | Admitting: Family Medicine

## 2017-08-06 VITALS — BP 118/72 | Ht 72.0 in | Wt 176.6 lb

## 2017-08-06 DIAGNOSIS — R4182 Altered mental status, unspecified: Secondary | ICD-10-CM | POA: Diagnosis not present

## 2017-08-06 DIAGNOSIS — J449 Chronic obstructive pulmonary disease, unspecified: Secondary | ICD-10-CM | POA: Diagnosis not present

## 2017-08-06 MED ORDER — TRAZODONE HCL 100 MG PO TABS: 100.0000 mg | ORAL_TABLET | Freq: Every day | ORAL | 5 refills | Status: DC

## 2017-08-06 NOTE — Telephone Encounter (Signed)
Patient seen in office 08/06/17 by Dr Richardson Landry to discuss results of EEG

## 2017-08-06 NOTE — Progress Notes (Signed)
   Subjective:    Patient ID: Timothy Davis, male    DOB: 1960-11-02, 57 y.o.   MRN: [id number] Patient arrives office for follow-up from hospitalization. HPI Patient arrives to discuss the results of recent EEG.  Complete hospital records all lab results all scan results all consult notes etc. read in presence of patient today.  Patient experienced hypersomnia.  Fairly profound.  Presented to the hospital with mental status changes.  Had a negative scan.  The neurologist evaluate the patient recommended EEG.  This was done.  Results returned normal.  Patient admits to excessive over-the-counter sleep aid use the night of admission.  States overall feeling better   Review of Systems No headache, no major weight loss or weight gain, no chest pain no back pain abdominal pain no change in bowel habits complete ROS otherwise negative     Objective:   Physical Exam  Alert and oriented, vitals reviewed and stable, NAD ENT-TM's and ext canals WNL bilat via otoscopic exam Soft palate, tonsils and post pharynx WNL via oropharyngeal exam Neck-symmetric, no masses; thyroid nonpalpable and nontender Pulmonary-no tachypnea or accessory muscle use; diffuse diminished breath sounds Clear without wheezes via auscultation Card--no abnrml murmurs, rhythm reg and rate WNL Carotid pulses symmetric, without bruits       Assessment & Plan:  Impression nonintentional overdose with resultant mental status changes and hospitalization.  Discussed.  Now on new sleep medication.  States trazodone 50 not helping that much so will increase to 1 regulate as per med chronic meds and follow-up as scheduled.  Also to follow-up with neurologist as recommended

## 2017-09-08 ENCOUNTER — Encounter: Payer: Self-pay | Admitting: Family Medicine

## 2017-09-08 ENCOUNTER — Ambulatory Visit (INDEPENDENT_AMBULATORY_CARE_PROVIDER_SITE_OTHER): Payer: No Typology Code available for payment source | Admitting: Family Medicine

## 2017-09-08 VITALS — BP 110/74 | Ht 71.0 in | Wt 177.0 lb

## 2017-09-08 DIAGNOSIS — Z0001 Encounter for general adult medical examination with abnormal findings: Secondary | ICD-10-CM | POA: Diagnosis not present

## 2017-09-08 DIAGNOSIS — Z1211 Encounter for screening for malignant neoplasm of colon: Secondary | ICD-10-CM | POA: Diagnosis not present

## 2017-09-08 DIAGNOSIS — F5101 Primary insomnia: Secondary | ICD-10-CM | POA: Diagnosis not present

## 2017-09-08 DIAGNOSIS — G8929 Other chronic pain: Secondary | ICD-10-CM

## 2017-09-08 DIAGNOSIS — M5440 Lumbago with sciatica, unspecified side: Secondary | ICD-10-CM | POA: Diagnosis not present

## 2017-09-08 DIAGNOSIS — Z0000 Encounter for general adult medical examination without abnormal findings: Secondary | ICD-10-CM

## 2017-09-08 MED ORDER — HYDROCODONE-ACETAMINOPHEN 7.5-325 MG PO TABS: 1.0000 | ORAL_TABLET | Freq: Four times a day (QID) | ORAL | 0 refills | Status: DC

## 2017-09-08 NOTE — Progress Notes (Signed)
Subjective:    Patient ID: Timothy Davis, male    DOB: [birth year], 57 y.o.   MRN: [id number]  HPI The patient comes in today for a wellness visit.    A review of their health history was completed.  A review of medications was also completed.  Any needed refills; hydrocodone  Eating habits: not health conscious  Falls/  MVA accidents in past few months: none  Regular exercise: none  Specialist pt sees on regular basis: yes pulmonologist  in [employer] health issues were discussed.   Additional concerns: none  This patient was seen today for chronic pain. Takes for low back pain.   The medication list was reviewed and updated.   -Compliance with medication: yes  - Number patient states they take daily: 4 a day   -when was the last dose patient took? today  The patient was advised the importance of maintaining medication and not using illegal substances with these.  Here for refills and follow up  The patient was educated that we can provide 3 monthly scripts for their medication, it is their responsibility to follow the instructions.  Side effects or complications from medications: none  Patient is aware that pain medications are meant to minimize the severity of the pain to allow their pain levels to improve to allow for better function. They are aware of that pain medications cannot totally remove their pain.  Due for UDT ( at least once per year) : last one aug 2018    Patient compliant with insomnia medication. Generally takes most nights. No obvious morning drowsiness. Definitely helps patient sleep. Without it patient states would not get a good nights rest.  Pt on preventive agent, gets them for free for now, copd very poor    All screening b w done last nov incl nef prostate test    Review of Systems  Constitutional: Negative for activity change, appetite change and fever.  HENT: Negative for congestion and rhinorrhea.   Eyes:  Negative for discharge.  Respiratory: Negative for cough and wheezing.   Cardiovascular: Negative for chest pain.  Gastrointestinal: Negative for abdominal pain, blood in stool and vomiting.  Genitourinary: Negative for difficulty urinating and frequency.  Musculoskeletal: Negative for neck pain.  Skin: Negative for rash.  Allergic/Immunologic: Negative for environmental allergies and food allergies.  Neurological: Negative for weakness and headaches.  Psychiatric/Behavioral: Negative for agitation.  All other systems reviewed and are negative.      Objective:   Physical Exam  Constitutional: He appears well-developed and well-nourished.  HENT:  Head: Normocephalic and atraumatic.  Right Ear: External ear normal.  Left Ear: External ear normal.  Nose: Nose normal.  Mouth/Throat: Oropharynx is clear and moist.  Eyes: Right eye exhibits no discharge. Left eye exhibits no discharge. No scleral icterus.  Neck: Normal range of motion. Neck supple. No thyromegaly present.  Cardiovascular: Normal rate, regular rhythm and normal heart sounds.  No murmur heard. Pulmonary/Chest: Effort normal and breath sounds normal. No respiratory distress. He has no wheezes.  Abdominal: Soft. Bowel sounds are normal. He exhibits no distension and no mass. There is no tenderness.  Genitourinary: Penis normal.  Musculoskeletal: Normal range of motion. He exhibits no edema.  Lymphadenopathy:    He has no cervical adenopathy.  Neurological: He is alert. He exhibits normal muscle tone. Coordination normal.  Skin: Skin is warm and dry. No erythema.  Psychiatric: He has a normal mood and affect. His behavior is normal. Judgment  normal.  Vitals reviewed.         Assessment & Plan:  Impression 1 wellness exam colonoscopy now to is been greater than 5 years since last 1 may recommended 5-year interval abdomen history diet discussed exercise discussed up-to-date on back  2.  Chronic pain  discussed  Impression: Chronic pain. Patient compliant with medication. No substantial side effects. [place] controlled substance registry reviewed to ensure compliance and proper use of medication. Patient aware goal of medicine is not complete resolution of pain but to control his symptoms to improve his functional capacity. Aware of potential adverse side effects  #3 insomnia ongoing challenges discussed to maintain same meds states meds mostly helping  Follow-up

## 2017-09-09 ENCOUNTER — Encounter: Payer: Self-pay | Admitting: Family Medicine

## 2017-09-10 ENCOUNTER — Encounter: Payer: Self-pay | Admitting: Gastroenterology

## 2017-09-16 ENCOUNTER — Encounter: Payer: Self-pay | Admitting: Adult Health

## 2017-09-16 ENCOUNTER — Ambulatory Visit (INDEPENDENT_AMBULATORY_CARE_PROVIDER_SITE_OTHER): Payer: No Typology Code available for payment source | Admitting: Adult Health

## 2017-09-16 DIAGNOSIS — J449 Chronic obstructive pulmonary disease, unspecified: Secondary | ICD-10-CM

## 2017-09-16 DIAGNOSIS — R0602 Shortness of breath: Secondary | ICD-10-CM

## 2017-09-16 NOTE — Assessment & Plan Note (Signed)
Compensated on present regimen . Does not have frequent exacerbations .  Will continue on dual therapy with LAMA/LABA  Does not qualify for LDCT screening  Vaccine utd.  Check cxr on return   Plan  Patient Instructions  Continue on BEVESPI 2 puffs Twice daily  , rinse after use.  Activity as tolerated.  Follow up with Dr. Vaughan Browner in 6 months and As needed

## 2017-09-16 NOTE — Assessment & Plan Note (Signed)
Suspect is due to very severe COPD , deconditioning ,  Recommend pulmonary rehab, he declined Discussed activities at home to help with act tolerance.

## 2017-09-16 NOTE — Progress Notes (Signed)
@Patient  ID: Timothy Davis, male    DOB: [birth year], 57 y.o.   MRN: [id number]  Chief Complaint  Patient presents with  . Follow-up    COPD     Referring provider: [name], MD  HPI: 57 year old male former smoker followed for very severe COPD with emphysema Worked as welder   TEST Joya San  PFT  09/13/16: FVC 2.33 L (44%) FEV1 0.93 L (23%) FEV1/FVC 0.40 FEF 25-75 0.37 L (11%) negative bronchodilator response TLC 7.38 L (6125%] ERV 290% ERV 1% (DLCO could not be performed) 03/05/12:FVC 4.01 L (74%) FEV1 1.00 L (24%) FEV1/FVC 0.25 FEF 25-75 0.22 L (6%) positive bronchodilator response TLC 10.11 L (137%) RV 284% ERV 109% DLCO uncorrected 52%  6MWT 09/18/16:  Walked 343 meters / Baseline Sat 98% on RA / Nadir Sat 96% on RA  IMAGING CXR PA/LAT 03/17/12 :Bilateral linear opacification consistent with scarring and previous chest x-ray imaging. No new mass or opacity appreciated. Hyperinflation with barreling of the chest and deep sulci bilaterally. No pleural effusion or thickening appreciated. Heart normal in size &mediastinum normal in contour.  CT CHEST W/ CONTRAST 9/1/9 No pleural effusion or thickening. Left rib fracture noted. Apical predominant emphysematous changes. No pathologic mediastinal adenopathy. No pericardial effusion. No parenchymal nodule or opacity other than linear opacity in the apices consistent with scar formation.  LABS 08/05/16 Alpha-1 antitrypsin: MM (155)   08/25/13 ANA: Negative  03/05/12 ABG on RA: 7.42 / 38 / 85 / 96%  Decline lung transplant referral 2018 (due to cost)  Decline Pulmonary rehab referral 2018   09/16/2017 Follow up ; COPD  Patient returns for a six-month follow-up. He is a former Dr. Ashok Cordia patient .  Patient has known very severe COPD.  FEV1 in 2018 was 23%.  Previous alpha-1 testing was normal.  He remains on BEVESPI Twice daily  .  Says overall his breathing is at baseline.  Patient gets short of breath with  minimum activity. Very hard to go up incline or steps. Able to do light household chores . No yard work . Gets winded if he carries items.   Has intermittent dry cough and wheeze. Uses albuterol inhaler once daily .  No ER /Hospital visit for breathing. No recent prednisone use. Not on oxygen.  Influenza, Pneumovax, Prevnar 13 vaccines are up-to-date.. Last chest x-ray January 2018 showed COPD changes with biapical scarring.Marland Kitchen He quit smoking in 2003.  Patient is a previous Building control surveyor.  Discussed pulmonary rehab, he declines again.  No hemoptysis , weight loss, chest pain or edema.    No Known Allergies  Immunization History  Administered Date(s) Administered  . Hepatitis A, Adult 11/15/2013, 05/05/2014  . Hepatitis B, adult 11/15/2013, 12/30/2013, 05/05/2014  . Influenza Split 07/13/2013  . Influenza,inj,Quad PF,6+ Mos 05/05/2014, 04/10/2015, 06/10/2016, 06/10/2017  . Influenza-Unspecified 06/28/2012  . Pneumococcal Conjugate-13 08/05/2016  . Pneumococcal Polysaccharide-23 03/02/2012    Past Medical History:  Diagnosis Date  . Asthmatic bronchitis   . Chronic back pain   . COPD (chronic obstructive pulmonary disease) ([place])   . Hepatitis C antibody test positive    noticed trying to give blood  . Insomnia   . Shortness of breath     Tobacco History: Social History   Tobacco Use  Smoking Status Former Smoker  . Packs/day: 1.50  . Years: 27.00  . Pack years: 40.50  . Types: Cigarettes  . Start date: 12/16/1974  . Last attempt to quit: 04/23/2002  . Years since quitting:  15.4  Smokeless Tobacco Current User  . Types: Chew   Ready to quit: Not Answered Counseling given: Not Answered   Outpatient Encounter Medications as of 09/16/2017  Medication Sig  . aspirin EC 81 MG tablet Take 1 tablet (81 mg total) by mouth daily.  . Glycopyrrolate-Formoterol (BEVESPI AEROSPHERE) 9-4.8 MCG/ACT AERO Inhale 2 puffs into the lungs 2 (two) times daily.  Marland Kitchen HYDROcodone-acetaminophen  (NORCO) 7.5-325 MG tablet Take 1 tablet by mouth 4 (four) times daily.  Marland Kitchen PROAIR HFA 108 (90 Base) MCG/ACT inhaler INHALE 2 PUFFS EVERY 6 HOURS AS NEEDED  . traZODone (DESYREL) 100 MG tablet Take 1 tablet (100 mg total) by mouth at bedtime.   No facility-administered encounter medications on file as of 09/16/2017.      Review of Systems  Constitutional:   No  weight loss, night sweats,  Fevers, chills, fatigue, or  lassitude.  HEENT:   No headaches,  Difficulty swallowing,  Tooth/dental problems, or  Sore throat,                No sneezing, itching, ear ache, nasal congestion, post nasal drip,   CV:  No chest pain,  Orthopnea, PND, swelling in lower extremities, anasarca, dizziness, palpitations, syncope.   GI  No heartburn, indigestion, abdominal pain, nausea, vomiting, diarrhea, change in bowel habits, loss of appetite, bloody stools.   Resp:  .  No excess mucus, no productive cough,  No non-productive cough,  No coughing up of blood.  No change in color of mucus.  No wheezing.  No chest wall deformity  Skin: no rash or lesions.  GU: no dysuria, change in color of urine, no urgency or frequency.  No flank pain, no hematuria   MS:  No joint pain or swelling.  No decreased range of motion.  No back pain.    Physical Exam  BP 136/84 (BP Location: Left Arm, Cuff Size: Normal)   Pulse (!) 59   Ht 6' (1.829 m)   Wt 173 lb (78.5 kg)   SpO2 96%   BMI 23.46 kg/m   GEN: A/Ox3; pleasant , NAD, thin    HEENT:  [place]/AT,  EACs-clear, TMs-wnl, NOSE-clear, THROAT-clear, no lesions, no postnasal drip or exudate noted.   NECK:  Supple w/ fair ROM; no JVD; normal carotid impulses w/o bruits; no thyromegaly or nodules palpated; no lymphadenopathy.    RESP  Decreased BS in bases ,  no accessory muscle use, no dullness to percussion  CARD:  RRR, no m/r/g, no peripheral edema, pulses intact, no cyanosis or clubbing.  GI:   Soft & nt; nml bowel sounds; no organomegaly or masses detected.    Musco: Warm bil, no deformities or joint swelling noted.   Neuro: alert, no focal deficits noted.    Skin: Warm, no lesions or rashes    Lab Results:  CBC    Component Value Date/Time   WBC 4.6 07/29/2017 0717   RBC 4.50 07/29/2017 0717   HGB 13.9 07/29/2017 0721   HCT 41.0 07/29/2017 0721   PLT 174 07/29/2017 0717   MCV 93.3 07/29/2017 0717   MCH 30.2 07/29/2017 0717   MCHC 32.4 07/29/2017 0717   RDW 13.1 07/29/2017 0717   LYMPHSABS 1.6 07/29/2017 0717   MONOABS 0.5 07/29/2017 0717   EOSABS 0.3 07/29/2017 0717   BASOSABS 0.0 07/29/2017 0717    BMET    Component Value Date/Time   NA 141 07/29/2017 0721   NA 141 12/23/2014 0839   K 3.9  07/29/2017 0721   CL 102 07/29/2017 0721   CO2 26 07/29/2017 0717   GLUCOSE 105 (H) 07/29/2017 0721   BUN 15 07/29/2017 0721   BUN 15 12/23/2014 0839   CREATININE 1.00 07/29/2017 0721   CREATININE 1.07 06/11/2017 0917   CALCIUM 9.1 07/29/2017 0717   GFRNONAA >60 07/29/2017 0717   GFRNONAA 77 06/11/2017 0917   GFRAA >60 07/29/2017 0717   GFRAA 89 06/11/2017 0917    BNP No results found for: BNP  ProBNP No results found for: PROBNP  Imaging: No results found.   Assessment & Plan:   COPD, very severe ([place]) Compensated on present regimen . Does not have frequent exacerbations .  Will continue on dual therapy with LAMA/LABA  Does not qualify for LDCT screening  Vaccine utd.  Check cxr on return   Plan  Patient Instructions  Continue on BEVESPI 2 puffs Twice daily  , rinse after use.  Activity as tolerated.  Follow up with Dr. Vaughan Browner in 6 months and As needed        DYSPNEA Suspect is due to very severe COPD , deconditioning ,  Recommend pulmonary rehab, he declined Discussed activities at home to help with act tolerance.       Rexene Edison, NP 09/16/2017

## 2017-09-16 NOTE — Patient Instructions (Addendum)
Continue on BEVESPI 2 puffs Twice daily  , rinse after use.  Activity as tolerated.  Follow up with Dr. Vaughan Browner in 6 months and As needed

## 2017-09-16 NOTE — Progress Notes (Signed)
57 y/o M with PMH: Asthma bronchitis, former smoker quit 04/23/2002, and Gold 4 COPD.  Presents today for his 6 months follow up on his COPD.  States he has been the same with his breathing.  Notes using the albuterol once every night on average.  Also, he has been using his Glycopyrrolate-Formoterol as prescribed.  Endorses SOB with exertion and wheezing with exertion. States he usually gets SOB when after climbing stairs and after unloading the groceries.

## 2017-10-16 DIAGNOSIS — L03031 Cellulitis of right toe: Secondary | ICD-10-CM | POA: Diagnosis not present

## 2017-10-16 DIAGNOSIS — M79671 Pain in right foot: Secondary | ICD-10-CM | POA: Diagnosis not present

## 2017-10-16 DIAGNOSIS — M79674 Pain in right toe(s): Secondary | ICD-10-CM | POA: Diagnosis not present

## 2017-10-16 DIAGNOSIS — L600 Ingrowing nail: Secondary | ICD-10-CM | POA: Diagnosis not present

## 2017-10-24 ENCOUNTER — Telehealth: Payer: Self-pay | Admitting: *Deleted

## 2017-10-24 ENCOUNTER — Encounter: Payer: Self-pay | Admitting: *Deleted

## 2017-10-24 ENCOUNTER — Other Ambulatory Visit: Payer: Self-pay | Admitting: *Deleted

## 2017-10-24 ENCOUNTER — Ambulatory Visit (INDEPENDENT_AMBULATORY_CARE_PROVIDER_SITE_OTHER): Payer: No Typology Code available for payment source | Admitting: Gastroenterology

## 2017-10-24 ENCOUNTER — Encounter: Payer: Self-pay | Admitting: Gastroenterology

## 2017-10-24 DIAGNOSIS — Z860101 Personal history of adenomatous and serrated colon polyps: Secondary | ICD-10-CM | POA: Insufficient documentation

## 2017-10-24 DIAGNOSIS — K7400 Hepatic fibrosis, unspecified: Secondary | ICD-10-CM | POA: Insufficient documentation

## 2017-10-24 DIAGNOSIS — Z86010 Personal history of colonic polyps: Secondary | ICD-10-CM

## 2017-10-24 MED ORDER — NA SULFATE-K SULFATE-MG SULF 17.5-3.13-1.6 GM/177ML PO SOLN: 1.0000 | ORAL | 0 refills | Status: DC

## 2017-10-24 NOTE — Assessment & Plan Note (Signed)
Due for surveillance colonoscopy.  Plan for deep sedation given chronic pain medication use.  I have discussed the risks, alternatives, benefits with regards to but not limited to the risk of reaction to medication, bleeding, infection, perforation and the patient is agreeable to proceed. Written consent to be obtained.

## 2017-10-24 NOTE — Assessment & Plan Note (Signed)
Based on current guidelines, would recommend lifelong hepatoma screening due to high fibrosis scores and risk of development of hepatoma even after HCV eradication.  Patient is not sure whether he wants to pursue additional ultrasounds.  Other option would be liver biopsy to get a variety of current level of fibrosis which would help determine need for lifelong hepatoma screening as elastography testing fibrosis scores can be overestimated.

## 2017-10-24 NOTE — Telephone Encounter (Signed)
Checked patient insurance on Timothy Davis and no PA is required for TCS

## 2017-10-24 NOTE — Progress Notes (Addendum)
Primary Care Physician:  [name], MD  Primary Gastroenterologist:  Barney Drain, MD REVIEWED-NO ADDITIONAL RECOMMENDATIONS.  Chief Complaint  Patient presents with  . Colonoscopy    consult    HPI:  Timothy Davis is a 57 y.o. male here to schedule surveillance colonoscopy.  Colonoscopy back in September 2013 with multiple tubular adenomas removed, larger one with tattoo spotting.  Advised to come back in 5 years.  He denies any bowel issues.  Bowel movements are normal.  No blood in stool or melena.  No heartburn, dysphagia, vomiting.  He has a history of COPD followed closely by pulmonology.  Does not require oxygen.  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  Initial elastography with fibrosis score 4.  After treatment elastography with fibrosis score of F2 with some F3 felt to be moderate risk for fibrosis.  Discussed with patient and spouse today that he should continue to have hepatoma screening based on current guidelines.  This would involve ultrasound at least every 6 months.  Other option would be liver biopsy to get a better idea of the degree of fibrosis and whether or not ongoing hepatoma surveillance is actually needed.   Current Outpatient Medications  Medication Sig Dispense Refill  . Glycopyrrolate-Formoterol (BEVESPI AEROSPHERE) 9-4.8 MCG/ACT AERO Inhale 2 puffs into the lungs 2 (two) times daily. 1 Inhaler 5  . HYDROcodone-acetaminophen (NORCO) 7.5-325 MG tablet Take 1 tablet by mouth 4 (four) times daily. 120 tablet 0  . PROAIR HFA 108 (90 Base) MCG/ACT inhaler INHALE 2 PUFFS EVERY 6 HOURS AS NEEDED 8.5 g 5  . traZODone (DESYREL) 100 MG tablet Take 1 tablet (100 mg total) by mouth at bedtime. 30 tablet 5  . aspirin EC 81 MG tablet Take 1 tablet (81 mg total) by mouth daily. (Patient not taking: Reported on 10/24/2017) 30 tablet 0   No current facility-administered medications for this visit.     Allergies as of 10/24/2017  . (No Known  Allergies)    Past Medical History:  Diagnosis Date  . Asthmatic bronchitis   . Chronic back pain   . COPD (chronic obstructive pulmonary disease) ([place])   . Hepatitis C antibody test positive    noticed trying to give blood  . Insomnia   . Shortness of breath     Past Surgical History:  Procedure Laterality Date  . COLONOSCOPY  04/14/2012   Fields-6 polyps-tubular adenoma x 3 and hyperplastic polyps x 2/internal hemorrhoids/mild diverticulosis in the sigmoid colon, 1CM TA rectum    Family History  Problem Relation Age of Onset  . COPD Mother   . Heart attack Mother   . Asthma Mother   . Lung disease Brother     Social History   Socioeconomic History  . Marital status: Married    Spouse name: Not on file  . Number of children: 2  . Years of education: Not on file  . Highest education level: Not on file  Occupational History  . Occupation: disabled    Employer: DISABLED  Social Needs  . Financial resource strain: Not on file  . Food insecurity:    Worry: Not on file    Inability: Not on file  . Transportation needs:    Medical: Not on file    Non-medical: Not on file  Tobacco Use  . Smoking status: Former Smoker    Packs/day: 1.50    Years: 27.00    Pack years: 40.50  Types: Cigarettes    Start date: 12/16/1974    Last attempt to quit: 04/23/2002    Years since quitting: 15.5  . Smokeless tobacco: Current User    Types: Chew  Substance and Sexual Activity  . Alcohol use: No    Alcohol/week: 0.0 oz    Comment: Hx heavy etoh (daily) x 53yrs, QUIT 62yrs ago  . Drug use: No    Comment: Hx marijuana, intranasal drugs, crack, cocaine.  QUIT 5 yrs ago  . Sexual activity: Yes    Partners: Female    Comment: monagamous  Lifestyle  . Physical activity:    Days per week: Not on file    Minutes per session: Not on file  . Stress: Not on file  Relationships  . Social connections:    Talks on phone: Not on file    Gets together: Not on file    Attends  religious service: Not on file    Active member of club or organization: Not on file    Attends meetings of clubs or organizations: Not on file    Relationship status: Not on file  . Intimate partner violence:    Fear of current or ex partner: Not on file    Emotionally abused: Not on file    Physically abused: Not on file    Forced sexual activity: Not on file  Other Topics Concern  . Not on file  Social History Narrative   Lives w/ wife, Wells Guiles Pulmonary (08/05/16):   Originally from [place], [place]. Moved to [place] when he was 57 y.o. Since then he has lived in [place]. 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 No known asbestos exposure. Currently has dogs and a cat. No bird or mold exposure. Enjoys fishing.       ROS:  General: Negative for anorexia, weight loss, fever, chills, fatigue, weakness. Eyes: Negative for vision changes.  ENT: Negative for hoarseness, difficulty swallowing , nasal congestion. CV: Negative for chest pain, angina, palpitations, peripheral edema.  Positive DOE Respiratory: Negative for dyspnea at rest,  cough, sputum, wheezing.  Positive DOE GI: See history of present illness. GU:  Negative for dysuria, hematuria, urinary incontinence, urinary frequency, nocturnal urination.  MS: Negative for joint pain, low back pain.  Derm: Negative for rash or itching.  Neuro: Negative for weakness, abnormal sensation, seizure, frequent headaches, memory loss, confusion.  Psych: Negative for anxiety, depression, suicidal ideation, hallucinations.  Endo: Negative for unusual weight change.  Heme: Negative for bruising or bleeding. Allergy: Negative for rash or hives.    Physical Examination:  BP 119/75   Pulse 61   Temp (!) 97.2 F (36.2 C) (Oral)   Ht 6' (1.829 m)   Wt 170 lb (77.1 kg)   BMI 23.06 kg/m    General: Well-nourished, well-developed in no acute distress.   Head: Normocephalic, atraumatic.   Eyes: Conjunctiva pink, no icterus. Mouth: Oropharyngeal mucosa moist and pink , no lesions erythema or exudate. Neck: Supple without thyromegaly, masses, or lymphadenopathy.  Lungs: Clear to auscultation bilaterally.  Heart: Regular rate and rhythm, no murmurs rubs or gallops.  Abdomen: Bowel sounds are normal, nontender, nondistended, no hepatosplenomegaly or masses, no abdominal bruits or    hernia , no rebound or guarding.   Rectal: Deferred Extremities: No lower extremity edema. No clubbing or deformities.  Neuro: Alert and oriented x 4 ,  grossly normal neurologically.  Skin: Warm and dry, no rash or jaundice.   Psych: Alert and cooperative, normal mood and affect.  Labs: Lab Results  Component Value Date   CREATININE 1.00 07/29/2017   BUN 15 07/29/2017   NA 141 07/29/2017   K 3.9 07/29/2017   CL 102 07/29/2017   CO2 26 07/29/2017   Lab Results  Component Value Date   ALT 16 (L) 07/29/2017   AST 25 07/29/2017   ALKPHOS 42 07/29/2017   BILITOT 0.4 07/29/2017   Lab Results  Component Value Date   WBC 4.6 07/29/2017   HGB 13.9 07/29/2017   HCT 41.0 07/29/2017   MCV 93.3 07/29/2017   PLT 174 07/29/2017     Imaging Studies: No results found.

## 2017-10-24 NOTE — Progress Notes (Signed)
CC'D TO PCP °

## 2017-10-24 NOTE — Patient Instructions (Addendum)
1. Colonoscopy as scheduled. See separate instructions.  2. Decide if you want a follow up liver ultrasound and let me know.

## 2017-10-27 ENCOUNTER — Telehealth: Payer: Self-pay | Admitting: *Deleted

## 2017-10-27 NOTE — Telephone Encounter (Signed)
Pre-op scheduled for 12/16/17 at 9:00am. Spouse aware. Letter mailed

## 2017-10-30 DIAGNOSIS — M79671 Pain in right foot: Secondary | ICD-10-CM | POA: Diagnosis not present

## 2017-10-30 DIAGNOSIS — M79674 Pain in right toe(s): Secondary | ICD-10-CM | POA: Diagnosis not present

## 2017-10-30 DIAGNOSIS — L03031 Cellulitis of right toe: Secondary | ICD-10-CM | POA: Diagnosis not present

## 2017-11-04 ENCOUNTER — Other Ambulatory Visit: Payer: Self-pay | Admitting: Family Medicine

## 2017-11-04 ENCOUNTER — Telehealth: Payer: Self-pay | Admitting: Adult Health

## 2017-11-04 MED ORDER — GLYCOPYRROLATE-FORMOTEROL 9-4.8 MCG/ACT IN AERO: 2.0000 | INHALATION_SPRAY | Freq: Two times a day (BID) | RESPIRATORY_TRACT | 5 refills | Status: DC

## 2017-11-04 NOTE — Telephone Encounter (Signed)
Should be managed by pulm specialist

## 2017-11-04 NOTE — Telephone Encounter (Signed)
Spoke with pts wife and she stated that she would contact pulm specialist to get refill

## 2017-11-04 NOTE — Telephone Encounter (Signed)
Rx has been sent in. Nothing further was needed. 

## 2017-12-15 NOTE — Patient Instructions (Signed)
Timothy Davis  12/15/2017     @PREFPERIOPPHARMACY @   Your procedure is scheduled on 12/23/2017.  Report to [facility] at 8:45 A.M.  Call this number if you have problems the morning of surgery:  [phone]   Remember: NOTHING TO EAT OR DRINK AFTER MIDNIGHT!  FOLLOW INSTRUCTIONS GIVEN TO YOU BY DR Nona Dell OFFICE     Take these medicines the morning of surgery with A SIP OF WATER Hydrocodone if needed, Pro Air    Do not wear jewelry, make-up or nail polish.  Do not wear lotions, powders, or perfumes, or deodorant.  Do not shave 48 hours prior to surgery.  Men may shave face and neck.  Do not bring valuables to the hospital.  [facility] is not responsible for any belongings or valuables.  Contacts, dentures or bridgework may not be worn into surgery.  Leave your suitcase in the car.  After surgery it may be brought to your room.  For patients admitted to the hospital, discharge time will be determined by your treatment team.  Patients discharged the day of surgery will not be allowed to drive home.    Please read over the following fact sheets that you were given. Anesthesia Post-op Instructions     PATIENT INSTRUCTIONS POST-ANESTHESIA  IMMEDIATELY FOLLOWING SURGERY:  Do not drive or operate machinery for the first twenty four hours after surgery.  Do not make any important decisions for twenty four hours after surgery or while taking narcotic pain medications or sedatives.  If you develop intractable nausea and vomiting or a severe headache please notify your doctor immediately.  FOLLOW-UP:  Please make an appointment with your surgeon as instructed. You do not need to follow up with anesthesia unless specifically instructed to do so.  WOUND CARE INSTRUCTIONS (if applicable):  Keep a dry clean dressing on the anesthesia/puncture wound site if there is drainage.  Once the wound has quit draining you may leave it open to air.  Generally you should leave the bandage intact for  twenty four hours unless there is drainage.  If the epidural site drains for more than 36-48 hours please call the anesthesia department.  QUESTIONS?:  Please feel free to call your physician or the hospital operator if you have any questions, and they will be happy to assist you.      Colonoscopy, Adult A colonoscopy is an exam to look at the entire large intestine. During the exam, a lubricated, bendable tube is inserted into the anus and then passed into the rectum, colon, and other parts of the large intestine. A colonoscopy is often done as a part of normal colorectal screening or in response to certain symptoms, such as anemia, persistent diarrhea, abdominal pain, and blood in the stool. The exam can help screen for and diagnose medical problems, including:  Tumors.  Polyps.  Inflammation.  Areas of bleeding.  Tell a health care provider about:  Any allergies you have.  All medicines you are taking, including vitamins, herbs, eye drops, creams, and over-the-counter medicines.  Any problems you or family members have had with anesthetic medicines.  Any blood disorders you have.  Any surgeries you have had.  Any medical conditions you have.  Any problems you have had passing stool. What are the risks? Generally, this is a safe procedure. However, problems may occur, including:  Bleeding.  A tear in the intestine.  A reaction to medicines given during the exam.  Infection (rare).  What happens before  the procedure? Eating and drinking restrictions Follow instructions from your health care provider about eating and drinking, which may include:  A few days before the procedure - follow a low-fiber diet. Avoid nuts, seeds, dried fruit, raw fruits, and vegetables.  1-3 days before the procedure - follow a clear liquid diet. Drink only clear liquids, such as clear broth or bouillon, black coffee or tea, clear juice, clear soft drinks or sports drinks, gelatin dessert,  and popsicles. Avoid any liquids that contain red or purple dye.  On the day of the procedure - do not eat or drink anything during the 2 hours before the procedure, or within the time period that your health care provider recommends.  Bowel prep If you were prescribed an oral bowel prep to clean out your colon:  Take it as told by your health care provider. Starting the day before your procedure, you will need to drink a large amount of medicated liquid. The liquid will cause you to have multiple loose stools until your stool is almost clear or light green.  If your skin or anus gets irritated from diarrhea, you may use these to relieve the irritation: ? Medicated wipes, such as adult wet wipes with aloe and vitamin E. ? A skin soothing-product like petroleum jelly.  If you vomit while drinking the bowel prep, take a break for up to 60 minutes and then begin the bowel prep again. If vomiting continues and you cannot take the bowel prep without vomiting, call your health care provider.  General instructions  Ask your health care provider about changing or stopping your regular medicines. This is especially important if you are taking diabetes medicines or blood thinners.  Plan to have someone take you home from the hospital or clinic. What happens during the procedure?  An IV tube may be inserted into one of your veins.  You will be given medicine to help you relax (sedative).  To reduce your risk of infection: ? Your health care team will wash or sanitize their hands. ? Your anal area will be washed with soap.  You will be asked to lie on your side with your knees bent.  Your health care provider will lubricate a long, thin, flexible tube. The tube will have a camera and a light on the end.  The tube will be inserted into your anus.  The tube will be gently eased through your rectum and colon.  Air will be delivered into your colon to keep it open. You may feel some pressure or  cramping.  The camera will be used to take images during the procedure.  A small tissue sample may be removed from your body to be examined under a microscope (biopsy). If any potential problems are found, the tissue will be sent to a lab for testing.  If small polyps are found, your health care provider may remove them and have them checked for cancer cells.  The tube that was inserted into your anus will be slowly removed. The procedure may vary among health care providers and hospitals. What happens after the procedure?  Your blood pressure, heart rate, breathing rate, and blood oxygen level will be monitored until the medicines you were given have worn off.  Do not drive for 24 hours after the exam.  You may have a small amount of blood in your stool.  You may pass gas and have mild abdominal cramping or bloating due to the air that was used to inflate your  colon during the exam.  It is up to you to get the results of your procedure. Ask your health care provider, or the department performing the procedure, when your results will be ready. This information is not intended to replace advice given to you by your health care provider. Make sure you discuss any questions you have with your health care provider. Document Released: 07/12/2000 Document Revised: 05/15/2016 Document Reviewed: 09/26/2015 Elsevier Interactive Patient Education  2018 [employer].

## 2017-12-16 ENCOUNTER — Encounter (HOSPITAL_COMMUNITY)
Admission: RE | Admit: 2017-12-16 | Discharge: 2017-12-16 | Disposition: A | Discharge status: Home / Self Care | Payer: No Typology Code available for payment source | Source: Ambulatory Visit | Attending: Gastroenterology | Admitting: Gastroenterology

## 2017-12-16 ENCOUNTER — Encounter (HOSPITAL_COMMUNITY): Payer: Self-pay

## 2017-12-16 DIAGNOSIS — Z01812 Encounter for preprocedural laboratory examination: Secondary | ICD-10-CM | POA: Diagnosis not present

## 2017-12-16 LAB — BASIC METABOLIC PANEL
ANION GAP: 8 (ref 5–15)
BUN: 15 mg/dL (ref 6–20)
CALCIUM: 9.3 mg/dL (ref 8.9–10.3)
CO2: 31 mmol/L (ref 22–32)
CREATININE: 0.98 mg/dL (ref 0.61–1.24)
Chloride: 101 mmol/L (ref 101–111)
Glucose, Bld: 102 mg/dL — ABNORMAL HIGH (ref 65–99)
Potassium: 3.9 mmol/L (ref 3.5–5.1)
SODIUM: 140 mmol/L (ref 135–145)

## 2017-12-16 LAB — CBC
HEMATOCRIT: 42.7 % (ref 39.0–52.0)
Hemoglobin: 14 g/dL (ref 13.0–17.0)
MCH: 30.1 pg (ref 26.0–34.0)
MCHC: 32.8 g/dL (ref 30.0–36.0)
MCV: 91.8 fL (ref 78.0–100.0)
PLATELETS: 146 10*3/uL — AB (ref 150–400)
RBC: 4.65 MIL/uL (ref 4.22–5.81)
RDW: 13.4 % (ref 11.5–15.5)
WBC: 4.2 10*3/uL (ref 4.0–10.5)

## 2017-12-18 ENCOUNTER — Ambulatory Visit (INDEPENDENT_AMBULATORY_CARE_PROVIDER_SITE_OTHER): Payer: No Typology Code available for payment source | Admitting: Family Medicine

## 2017-12-18 ENCOUNTER — Encounter: Payer: Self-pay | Admitting: Family Medicine

## 2017-12-18 VITALS — BP 126/84 | Ht 72.0 in | Wt 169.8 lb

## 2017-12-18 DIAGNOSIS — F5101 Primary insomnia: Secondary | ICD-10-CM

## 2017-12-18 DIAGNOSIS — M5440 Lumbago with sciatica, unspecified side: Secondary | ICD-10-CM | POA: Diagnosis not present

## 2017-12-18 DIAGNOSIS — J449 Chronic obstructive pulmonary disease, unspecified: Secondary | ICD-10-CM

## 2017-12-18 DIAGNOSIS — G8929 Other chronic pain: Secondary | ICD-10-CM

## 2017-12-18 MED ORDER — HYDROCODONE-ACETAMINOPHEN 7.5-325 MG PO TABS: 1.0000 | ORAL_TABLET | Freq: Four times a day (QID) | ORAL | 0 refills | Status: DC | PRN

## 2017-12-18 NOTE — Progress Notes (Signed)
   Subjective:    Patient ID: Timothy Davis, male    DOB: 08/18/1960, 57 y.o.   MRN: [id number]  HPI This patient was seen today for chronic pain  The medication list was reviewed and updated.   -Compliance with medication: hydrocodone  - Number patient states they take daily: 4 a day  -when was the last dose patient took? This am  The patient was advised the importance of maintaining medication and not using illegal substances with these.  Here for refills and follow up  The patient was educated that we can provide 3 monthly scripts for their medication, it is their responsibility to follow the instructions.  Side effects or complications from medications: none  Patient is aware that pain medications are meant to minimize the severity of the pain to allow their pain levels to improve to allow for better function. They are aware of that pain medications cannot totally remove their pain.  Due for UDT ( at least once per year) : 02/2017  Patient compliant with insomnia medication. Generally takes most nights. No obvious morning drowsiness. Definitely helps patient sleep. Without it patient states would not get a good nights rest.       Review of Systems No headache, no major weight loss or weight gain, no chest pain no back pain abdominal pain no change in bowel habits complete ROS otherwise negative     Objective:   Physical Exam .Alert vitals stable, NAD. Blood pressure good on repeat. HEENT normal. Lungs clear. Heart regular rate and rhythm. Positive low back pain to percussion.  Plus minus sciatica unilateral  Alert and oriented, vitals reviewed and stable, NAD ENT-TM's and ext canals WNL bilat via otoscopic exam Soft palate, tonsils and post pharynx WNL via oropharyngeal exam Neck-symmetric, no masses; thyroid nonpalpable and nontender Pulmonary-no tachypnea or accessory muscle use; Clear without wheezes via auscultation Card--no abnrml murmurs, rhythm reg and rate  WNL Carotid pulses symmetric, without bruits      Assessment & Plan:  1 impression chronic back pain with neuropathic features and ongoing need for pain medication.  Discussed.  Pain medicine prescribed.  Impression: Chronic pain. Patient compliant with medication. No substantial side effects. [place] controlled substance registry reviewed to ensure compliance and proper use of medication. Patient aware goal of medicine is not complete resolution of pain but to control his symptoms to improve his functional capacity. Aware of potential adverse side effects  #2.  Insomnia.  Trazodone really does seem to help.  Patient reports no major side effects with it.  3.  COPD.  Severe nature.  No longer smokes.  Importance of exercise discussed.  Importance of compliance with specialist medicine discussed.   Greater than 50% of this 25 minute face to face visit was spent in counseling and discussion and coordination of care regarding the above diagnosis/diagnosies

## 2017-12-23 ENCOUNTER — Other Ambulatory Visit: Payer: Self-pay

## 2017-12-23 ENCOUNTER — Ambulatory Visit (HOSPITAL_COMMUNITY): Payer: No Typology Code available for payment source | Admitting: Anesthesiology

## 2017-12-23 ENCOUNTER — Ambulatory Visit (HOSPITAL_COMMUNITY)
Admission: RE | Admit: 2017-12-23 | Discharge: 2017-12-23 | Disposition: A | Discharge status: Home / Self Care | Payer: No Typology Code available for payment source | Source: Ambulatory Visit | Attending: Gastroenterology | Admitting: Gastroenterology

## 2017-12-23 ENCOUNTER — Encounter (HOSPITAL_COMMUNITY): Payer: Self-pay | Admitting: *Deleted

## 2017-12-23 ENCOUNTER — Encounter (HOSPITAL_COMMUNITY)
Admission: RE | Disposition: A | Discharge status: Home / Self Care | Payer: Self-pay | Source: Ambulatory Visit | Attending: Gastroenterology

## 2017-12-23 ENCOUNTER — Ambulatory Visit: Payer: No Typology Code available for payment source | Admitting: Family Medicine

## 2017-12-23 DIAGNOSIS — Z79899 Other long term (current) drug therapy: Secondary | ICD-10-CM | POA: Diagnosis not present

## 2017-12-23 DIAGNOSIS — Z86010 Personal history of colonic polyps: Secondary | ICD-10-CM | POA: Insufficient documentation

## 2017-12-23 DIAGNOSIS — J449 Chronic obstructive pulmonary disease, unspecified: Secondary | ICD-10-CM | POA: Insufficient documentation

## 2017-12-23 DIAGNOSIS — G4700 Insomnia, unspecified: Secondary | ICD-10-CM | POA: Insufficient documentation

## 2017-12-23 DIAGNOSIS — K621 Rectal polyp: Secondary | ICD-10-CM

## 2017-12-23 DIAGNOSIS — Z87891 Personal history of nicotine dependence: Secondary | ICD-10-CM | POA: Diagnosis not present

## 2017-12-23 DIAGNOSIS — K648 Other hemorrhoids: Secondary | ICD-10-CM | POA: Diagnosis not present

## 2017-12-23 DIAGNOSIS — D123 Benign neoplasm of transverse colon: Secondary | ICD-10-CM | POA: Diagnosis not present

## 2017-12-23 DIAGNOSIS — D128 Benign neoplasm of rectum: Secondary | ICD-10-CM | POA: Insufficient documentation

## 2017-12-23 HISTORY — PX: POLYPECTOMY: SHX5525

## 2017-12-23 HISTORY — PX: COLONOSCOPY WITH PROPOFOL: SHX5780

## 2017-12-23 SURGERY — COLONOSCOPY WITH PROPOFOL
Anesthesia: General

## 2017-12-23 MED ORDER — PROPOFOL 500 MG/50ML IV EMUL
INTRAVENOUS | Status: DC | PRN
  Administered 2017-12-23: 150 ug/kg/min via INTRAVENOUS
  Administered 2017-12-23: 85 ug/kg/min via INTRAVENOUS

## 2017-12-23 MED ORDER — PROPOFOL 10 MG/ML IV BOLUS
INTRAVENOUS | Status: AC
  Filled 2017-12-23: qty 40

## 2017-12-23 MED ORDER — LACTATED RINGERS IV SOLN
INTRAVENOUS | Status: DC
  Administered 2017-12-23: 09:00:00 via INTRAVENOUS

## 2017-12-23 MED ORDER — CHLORHEXIDINE GLUCONATE CLOTH 2 % EX PADS: 6.0000 | MEDICATED_PAD | Freq: Once | CUTANEOUS | Status: DC

## 2017-12-23 MED ORDER — PROPOFOL 10 MG/ML IV BOLUS
INTRAVENOUS | Status: DC | PRN
  Administered 2017-12-23: 40 mg via INTRAVENOUS
  Administered 2017-12-23: 20 mg via INTRAVENOUS

## 2017-12-23 NOTE — Anesthesia Postprocedure Evaluation (Signed)
Anesthesia Post Note  Patient: Timothy Davis  Procedure(s) Performed: COLONOSCOPY WITH PROPOFOL (N/A ) POLYPECTOMY  Patient location during evaluation: PACU Anesthesia Type: General Level of consciousness: awake and alert and patient cooperative Pain management: satisfactory to patient Vital Signs Assessment: post-procedure vital signs reviewed and stable Respiratory status: spontaneous breathing Cardiovascular status: stable Postop Assessment: no apparent nausea or vomiting Anesthetic complications: no     Last Vitals:  Vitals:   12/23/17 1129 12/23/17 1130  BP: 129/85   Pulse: (!) 57 (!) 57  Resp: 13 13  Temp:    SpO2: 97% 98%    Last Pain:  Vitals:   12/23/17 1129  PainSc: 0-No pain                 Willy Vorce

## 2017-12-23 NOTE — Op Note (Signed)
[facility] Patient Name: Timothy Davis Procedure Date: 12/23/2017 10:38 AM MRN: [id number] Date of Birth: 05-Apr-1961 Attending MD: Barney Drain MD, MD CSN: [id number] Age: 57 Admit Type: Outpatient Procedure:                Colonoscopy WITH COLD FORCEPS/SNARE & SNARE CAUTERY                            POLYPECTOMY Indications:              Personal history of colonic polyps Providers:                Barney Drain MD, MD, Otis Peak B. Sharon Seller, RN,                            Randa Spike, Technician Referring MD:             Elayne Snare. [name] Medicines:                Propofol per Anesthesia Complications:            No immediate complications. Estimated Blood Loss:     Estimated blood loss was minimal. Procedure:                Pre-Anesthesia Assessment:                           - Prior to the procedure, a History and Physical                            was performed, and patient medications and                            allergies were reviewed. The patient's tolerance of                            previous anesthesia was also reviewed. The risks                            and benefits of the procedure and the sedation                            options and risks were discussed with the patient.                            All questions were answered, and informed consent                            was obtained. Prior Anticoagulants: The patient has                            taken no previous anticoagulant or antiplatelet                            agents. ASA Grade Assessment: II - A patient with  mild systemic disease. After reviewing the risks                            and benefits, the patient was deemed in                            satisfactory condition to undergo the procedure.                            After obtaining informed consent, the colonoscope                            was passed under direct vision. Throughout the       procedure, the patient's blood pressure, pulse, and                            oxygen saturations were monitored continuously. The                            EC-[id number] ([id number]) scope was introduced through                            the anus and advanced to the the cecum, identified                            by appendiceal orifice and ileocecal valve. The                            colonoscopy was somewhat difficult due to a                            tortuous colon. Successful completion of the                            procedure was aided by straightening and shortening                            the scope to obtain bowel loop reduction and                            COLOWRAP. The patient tolerated the procedure well.                            The quality of the bowel preparation was good. The                            ileocecal valve, appendiceal orifice, and rectum                            were photographed. Scope In: 10:51:43 AM Scope Out: 11:10:45 AM Scope Withdrawal Time: 0 hours 17 minutes 1 second  Total Procedure Duration: 0 hours 19 minutes 2 seconds  Findings:      A 6 mm polyp was found in the  hepatic flexure. The polyp was sessile.       The polyp was removed with a hot snare. Resection and retrieval were       complete.      A 4 mm polyp was found in the hepatic flexure. The polyp was sessile.       The polyp was removed with a cold snare. Resection and retrieval were       complete.      A 3 mm polyp was found in the rectum. The polyp was sessile. The polyp       was removed with a cold biopsy forceps. Resection and retrieval were       complete.      Internal hemorrhoids were found during retroflexion. The hemorrhoids       were moderate. Impression:               - One 6 mm polyp at the hepatic flexure, removed                            with a hot snare. Resected and retrieved.                           - One 4 mm polyp at the hepatic flexure, removed                             with a cold snare. Resected and retrieved.                           - One 3 mm polyp in the rectum, removed with a cold                            biopsy forceps. Resected and retrieved.                           - MODERATE Internal hemorrhoids. Moderate Sedation:      Per Anesthesia Care Recommendation:           - Repeat colonoscopy in 3 - 5 years for                            surveillance.                           - High fiber diet.                           - Continue present medications.                           - Await pathology results.                           - Patient has a contact number available for                            emergencies. The signs and symptoms of potential  delayed complications were discussed with the                            patient. Return to normal activities tomorrow.                            Written discharge instructions were provided to the                            patient. Procedure Code(s):        --- Professional ---                           [phone], Colonoscopy, flexible; with removal of                            tumor(s), polyp(s), or other lesion(s) by snare                            technique                           45380, 3, Colonoscopy, flexible; with biopsy,                            single or multiple Diagnosis Code(s):        --- Professional ---                           D12.3, Benign neoplasm of transverse colon (hepatic                            flexure or splenic flexure)                           K62.1, Rectal polyp                           K64.8, Other hemorrhoids                           Z86.010, Personal history of colonic polyps CPT copyright 2017 American Medical Association. All rights reserved. The codes documented in this report are preliminary and upon coder review may  be revised to meet current compliance requirements. Barney Drain, MD Barney Drain  MD, MD 12/23/2017 11:24:26 AM This report has been signed electronically. Number of Addenda: 0

## 2017-12-23 NOTE — Anesthesia Preprocedure Evaluation (Signed)
Anesthesia Evaluation  Patient identified by MRN, date of birth, ID band Patient awake    Reviewed: Allergy & Precautions, NPO status , Patient's Chart, lab work & pertinent test results  Airway Mallampati: II  TM Distance: >3 FB Neck ROM: Full    Dental no notable dental hx.    Pulmonary neg pulmonary ROS, shortness of breath and with exertion, asthma , COPD, former smoker,  usues inhalers qday and rescue inhalers regularly   Pulmonary exam normal breath sounds clear to auscultation       Cardiovascular Exercise Tolerance: Poor negative cardio ROS Normal cardiovascular examII Rhythm:Regular Rate:Normal  Denies cardiac issues -limited ET from COPD   Neuro/Psych negative neurological ROS  negative psych ROS   GI/Hepatic negative GI ROS, Neg liver ROS, (+) Hepatitis -, CStates Hep C-  Tx'd a few years back   Endo/Other  negative endocrine ROS  Renal/GU negative Renal ROS  negative genitourinary   Musculoskeletal negative musculoskeletal ROS (+)   Abdominal   Peds negative pediatric ROS (+)  Hematology negative hematology ROS (+)   Anesthesia Other Findings   Reproductive/Obstetrics negative OB ROS                             Anesthesia Physical Anesthesia Plan  ASA: III  Anesthesia Plan: General   Post-op Pain Management:    Induction: Intravenous  PONV Risk Score and Plan:   Airway Management Planned: Simple Face Mask  Additional Equipment:   Intra-op Plan:   Post-operative Plan:   Informed Consent: I have reviewed the patients History and Physical, chart, labs and discussed the procedure including the risks, benefits and alternatives for the proposed anesthesia with the patient or authorized representative who has indicated his/her understanding and acceptance.     Plan Discussed with: CRNA  Anesthesia Plan Comments:         Anesthesia Quick Evaluation

## 2017-12-23 NOTE — H&P (Signed)
Primary Care Physician:  [name], MD Primary Gastroenterologist:  Dr. Oneida Alar  Pre-Procedure History & Physical: HPI:  Timothy Davis is a 57 y.o. male here for  PERSONAL HISTORY OF POLYPS.  Past Medical History:  Diagnosis Date  . Asthmatic bronchitis   . Chronic back pain   . COPD (chronic obstructive pulmonary disease) ([place])   . Hepatitis C antibody test positive    noticed trying to give blood  . Insomnia   . Shortness of breath     Past Surgical History:  Procedure Laterality Date  . COLONOSCOPY  04/14/2012   Fields-6 polyps-tubular adenoma x 3 and hyperplastic polyps x 2/internal hemorrhoids/mild diverticulosis in the sigmoid colon, 1CM TA rectum    Prior to Admission medications   Medication Sig Start Date End Date Taking? Authorizing Provider  Glycopyrrolate-Formoterol (BEVESPI AEROSPHERE) 9-4.8 MCG/ACT AERO Inhale 2 puffs into the lungs 2 (two) times daily. 11/04/17  Yes Parrett, Tammy S, NP  Na Sulfate-K Sulfate-Mg Sulf 17.5-3.13-1.6 GM/177ML SOLN Take 1 kit by mouth as directed. 10/24/17  Yes Fields, Marga Melnick, MD  PROAIR HFA 108 (90 Base) MCG/ACT inhaler INHALE 2 PUFFS EVERY 6 HOURS AS NEEDED Patient taking differently: INHALE 2 PUFFS EVERY 6 HOURS AS NEEDED FOR WHEEZING/SHORTNESS OF BREATH. 10/03/16  Yes [name], MD  traZODone (DESYREL) 100 MG tablet Take 1 tablet (100 mg total) by mouth at bedtime. 08/06/17  Yes [name], MD  HYDROcodone-acetaminophen (NORCO) 7.5-325 MG tablet Take 1 tablet by mouth 4 (four) times daily as needed (for back pain.). 12/18/17   [name], MD    Allergies as of 10/24/2017  . (No Known Allergies)    Family History  Problem Relation Age of Onset  . COPD Mother   . Heart attack Mother   . Asthma Mother   . Lung disease Brother     Social History   Socioeconomic History  . Marital status: Married    Spouse name: Not on file  . Number of children: 2  . Years of education: Not on file  . Highest  education level: Not on file  Occupational History  . Occupation: disabled    Employer: DISABLED  Social Needs  . Financial resource strain: Not on file  . Food insecurity:    Worry: Not on file    Inability: Not on file  . Transportation needs:    Medical: Not on file    Non-medical: Not on file  Tobacco Use  . Smoking status: Former Smoker    Packs/day: 1.50    Years: 27.00    Pack years: 40.50    Types: Cigarettes    Start date: 12/16/1974    Last attempt to quit: 04/23/2002    Years since quitting: 15.6  . Smokeless tobacco: Current User    Types: Chew  Substance and Sexual Activity  . Alcohol use: No    Alcohol/week: 0.0 oz    Comment: Hx heavy etoh (daily) x 72yr, QUIT 258yrago  . Drug use: No    Comment: Hx marijuana, intranasal drugs, crack, cocaine.  QUIT 5 yrs ago  . Sexual activity: Yes    Partners: Female    Comment: monagamous  Lifestyle  . Physical activity:    Days per week: Not on file    Minutes per session: Not on file  . Stress: Not on file  Relationships  . Social connections:    Talks on phone: Not on file    Gets together:  Not on file    Attends religious service: Not on file    Active member of club or organization: Not on file    Attends meetings of clubs or organizations: Not on file    Relationship status: Not on file  . Intimate partner violence:    Fear of current or ex partner: Not on file    Emotionally abused: Not on file    Physically abused: Not on file    Forced sexual activity: Not on file  Other Topics Concern  . Not on file  Social History Narrative   Lives w/ wife, Wells Guiles Pulmonary (08/05/16):   Originally from [place], [place]. Moved to [place] when he was 57 y.o. Since then he has lived in [place]. 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 No known asbestos exposure. Currently has dogs and a cat. No bird or mold exposure. Enjoys fishing.      Review of Systems: See HPI, otherwise negative ROS   Physical Exam: BP 136/85   Pulse 60   Resp 14   Ht 6' (1.829 m)   Wt 169 lb (76.7 kg)   SpO2 97%   BMI 22.92 kg/m  General:   Alert,  pleasant and cooperative in NAD Head:  Normocephalic and atraumatic. Neck:  Supple; Lungs:  Clear throughout to auscultation.    Heart:  Regular rate and rhythm. Abdomen:  Soft, nontender and nondistended. Normal bowel sounds, without guarding, and without rebound.   Neurologic:  Alert and  oriented x4;  grossly normal neurologically.  Impression/Plan:    PERSONAL HISTORY OF POLYPS.  PLAN:  1. TCS TODAY DISCUSSED PROCEDURE, BENEFITS, & RISKS: < 1% chance of medication reaction, bleeding, perforation, or rupture of spleen/liver.

## 2017-12-23 NOTE — Discharge Instructions (Signed)
You had 3 polyps removed. You have SMALL internal hemorrhoids.   DRINK WATER TO KEEP YOUR URINE LIGHT YELLOW.  FOLLOW A HIGH FIBER DIET. AVOID ITEMS THAT CAUSE BLOATING & GAS. SEE INFO BELOW.  YOUR BIOPSY RESULTS WILL BE AVAILABLE IN 7 DAYS.   Next colonoscopy in 3-5 years.    Colonoscopy Care After Read the instructions outlined below and refer to this sheet in the next week. These discharge instructions provide you with general information on caring for yourself after you leave the hospital. While your treatment has been planned according to the most current medical practices available, unavoidable complications occasionally occur. If you have any problems or questions after discharge, call DR. FIELDS, [phone].  ACTIVITY  You may resume your regular activity, but move at a slower pace for the next 24 hours.   Take frequent rest periods for the next 24 hours.   Walking will help get rid of the air and reduce the bloated feeling in your belly (abdomen).   No driving for 24 hours (because of the medicine (anesthesia) used during the test).   You may shower.   Do not sign any important legal documents or operate any machinery for 24 hours (because of the anesthesia used during the test).    NUTRITION  Drink plenty of fluids.   You may resume your normal diet as instructed by your doctor.   Begin with a light meal and progress to your normal diet. Heavy or fried foods are harder to digest and may make you feel sick to your stomach (nauseated).   Avoid alcoholic beverages for 24 hours or as instructed.    MEDICATIONS  You may resume your normal medications.   WHAT YOU CAN EXPECT TODAY  Some feelings of bloating in the abdomen.   Passage of more gas than usual.   Spotting of blood in your stool or on the toilet paper  .  IF YOU HAD POLYPS REMOVED DURING THE COLONOSCOPY:  Eat a soft diet IF YOU HAVE NAUSEA, BLOATING, ABDOMINAL PAIN, OR VOMITING.    FINDING  OUT THE RESULTS OF YOUR TEST Not all test results are available during your visit. DR. Oneida Alar WILL CALL YOU WITHIN 14 DAYS OF YOUR PROCEDUE WITH YOUR RESULTS. Do not assume everything is normal if you have not heard from DR. FIELDS, CALL HER OFFICE AT [phone].  SEEK IMMEDIATE MEDICAL ATTENTION AND CALL THE OFFICE: [phone] IF:  You have more than a spotting of blood in your stool.   Your belly is swollen (abdominal distention).   You are nauseated or vomiting.   You have a temperature over 101F.   You have abdominal pain or discomfort that is severe or gets worse throughout the day.   High-Fiber Diet A high-fiber diet changes your normal diet to include more whole grains, legumes, fruits, and vegetables. Changes in the diet involve replacing refined carbohydrates with unrefined foods. The calorie level of the diet is essentially unchanged. The Dietary Reference Intake (recommended amount) for adult males is 38 grams per day. For adult females, it is 25 grams per day. Pregnant and lactating women should consume 28 grams of fiber per day. Fiber is the intact part of a plant that is not broken down during digestion. Functional fiber is fiber that has been isolated from the plant to provide a beneficial effect in the body.  PURPOSE  Increase stool bulk.   Ease and regulate bowel movements.   Lower cholesterol.   REDUCE RISK OF COLON  CANCER  INDICATIONS THAT YOU NEED MORE FIBER  Constipation and hemorrhoids.   Uncomplicated diverticulosis (intestine condition) and irritable bowel syndrome.   Weight management.   As a protective measure against hardening of the arteries (atherosclerosis), diabetes, and cancer.   GUIDELINES FOR INCREASING FIBER IN THE DIET  Start adding fiber to the diet slowly. A gradual increase of about 5 more grams (2 slices of whole-wheat bread, 2 servings of most fruits or vegetables, or 1 bowl of high-fiber cereal) per day is best. Too rapid an  increase in fiber may result in constipation, flatulence, and bloating.   Drink enough water and fluids to keep your urine clear or pale yellow. Water, juice, or caffeine-free drinks are recommended. Not drinking enough fluid may cause constipation.   Eat a variety of high-fiber foods rather than one type of fiber.   Try to increase your intake of fiber through using high-fiber foods rather than fiber pills or supplements that contain small amounts of fiber.   The goal is to change the types of food eaten. Do not supplement your present diet with high-fiber foods, but replace foods in your present diet.   INCLUDE A VARIETY OF FIBER SOURCES  Replace refined and processed grains with whole grains, canned fruits with fresh fruits, and incorporate other fiber sources. White rice, white breads, and most bakery goods contain little or no fiber.   Brown whole-grain rice, buckwheat oats, and many fruits and vegetables are all good sources of fiber. These include: broccoli, Brussels sprouts, cabbage, cauliflower, beets, sweet potatoes, white potatoes (skin on), carrots, tomatoes, eggplant, squash, berries, fresh fruits, and dried fruits.   Cereals appear to be the richest source of fiber. Cereal fiber is found in whole grains and bran. Bran is the fiber-rich outer coat of cereal grain, which is largely removed in refining. In whole-grain cereals, the bran remains. In breakfast cereals, the largest amount of fiber is found in those with "bran" in their names. The fiber content is sometimes indicated on the label.   You may need to include additional fruits and vegetables each day.   In baking, for 1 cup white flour, you may use the following substitutions:   1 cup whole-wheat flour minus 2 tablespoons.   1/2 cup white flour plus 1/2 cup whole-wheat flour.   Polyps, Colon  A polyp is extra tissue that grows inside your body. Colon polyps grow in the large intestine. The large intestine, also called  the colon, is part of your digestive system. It is a long, hollow tube at the end of your digestive tract where your body makes and stores stool. Most polyps are not dangerous. They are benign. This means they are not cancerous. But over time, some types of polyps can turn into cancer. Polyps that are smaller than a pea are usually not harmful. But larger polyps could someday become or may already be cancerous. To be safe, doctors remove all polyps and test them.    PREVENTION There is not one sure way to prevent polyps. You might be able to lower your risk of getting them if you:  Eat more fruits and vegetables and less fatty food.   Do not smoke.   Avoid alcohol.   Exercise every day.   Lose weight if you are overweight.   Eating more calcium and folate can also lower your risk of getting polyps. Some foods that are rich in calcium are milk, cheese, and broccoli. Some foods that are rich in folate  are chickpeas, kidney beans, and spinach.       PATIENT INSTRUCTIONS POST-ANESTHESIA  IMMEDIATELY FOLLOWING SURGERY:  Do not drive or operate machinery for the first twenty four hours after surgery.  Do not make any important decisions for twenty four hours after surgery or while taking narcotic pain medications or sedatives.  If you develop intractable nausea and vomiting or a severe headache please notify your doctor immediately.  FOLLOW-UP:  Please make an appointment with your surgeon as instructed. You do not need to follow up with anesthesia unless specifically instructed to do so.  WOUND CARE INSTRUCTIONS (if applicable):  Keep a dry clean dressing on the anesthesia/puncture wound site if there is drainage.  Once the wound has quit draining you may leave it open to air.  Generally you should leave the bandage intact for twenty four hours unless there is drainage.  If the epidural site drains for more than 36-48 hours please call the anesthesia department.  QUESTIONS?:  Please feel  free to call your physician or the hospital operator if you have any questions, and they will be happy to assist you.

## 2017-12-23 NOTE — Anesthesia Procedure Notes (Signed)
Procedure Name: MAC Date/Time: 12/23/2017 10:36 AM Performed by: Vista Deck, CRNA Pre-anesthesia Checklist: Patient identified, Emergency Drugs available, Suction available, Timeout performed and Patient being monitored Patient Re-evaluated:Patient Re-evaluated prior to induction Oxygen Delivery Method: Nasal Cannula

## 2017-12-23 NOTE — Transfer of Care (Signed)
Immediate Anesthesia Transfer of Care Note  Patient: Timothy Davis  Procedure(s) Performed: COLONOSCOPY WITH PROPOFOL (N/A ) POLYPECTOMY  Patient Location: PACU  Anesthesia Type:MAC  Level of Consciousness: awake and patient cooperative  Airway & Oxygen Therapy: Patient Spontanous Breathing  Post-op Assessment: Report given to RN and Post -op Vital signs reviewed and stable  Post vital signs: Reviewed and stable  Last Vitals:  Vitals Value Taken Time  BP    Temp    Pulse    Resp    SpO2      Last Pain:  Vitals:   12/23/17 1039  PainSc: 0-No pain      Patients Stated Pain Goal: 4 (83/41/96 2229)  Complications: No apparent anesthesia complications

## 2017-12-25 ENCOUNTER — Encounter (HOSPITAL_COMMUNITY): Payer: Self-pay | Admitting: Gastroenterology

## 2017-12-29 ENCOUNTER — Telehealth: Payer: Self-pay | Admitting: Gastroenterology

## 2017-12-29 NOTE — Telephone Encounter (Signed)
  Please call pt. He had 3 simple adenomas removed from hIS colon.    DRINK WATER TO KEEP YOUR URINE LIGHT YELLOW.  FOLLOW A HIGH FIBER DIET. AVOID ITEMS THAT CAUSE BLOATING & GAS.   Next colonoscopy in 3 years.

## 2017-12-29 NOTE — Telephone Encounter (Signed)
Patient made aware   Routing to Timothy Davis to nic reminder

## 2017-12-29 NOTE — Telephone Encounter (Signed)
LMOM, waiting on a return call.  

## 2017-12-30 NOTE — Telephone Encounter (Signed)
Reminder in epic °

## 2018-02-03 ENCOUNTER — Other Ambulatory Visit: Payer: Self-pay | Admitting: Family Medicine

## 2018-03-11 ENCOUNTER — Ambulatory Visit (INDEPENDENT_AMBULATORY_CARE_PROVIDER_SITE_OTHER): Payer: No Typology Code available for payment source | Admitting: Pulmonary Disease

## 2018-03-11 ENCOUNTER — Encounter: Payer: Self-pay | Admitting: Pulmonary Disease

## 2018-03-11 VITALS — BP 128/70 | HR 60 | Ht 71.0 in | Wt 167.4 lb

## 2018-03-11 DIAGNOSIS — J449 Chronic obstructive pulmonary disease, unspecified: Secondary | ICD-10-CM | POA: Diagnosis not present

## 2018-03-11 MED ORDER — ALBUTEROL SULFATE HFA 108 (90 BASE) MCG/ACT IN AERS: 2.0000 | INHALATION_SPRAY | Freq: Four times a day (QID) | RESPIRATORY_TRACT | 5 refills | Status: DC | PRN

## 2018-03-11 MED ORDER — GLYCOPYRROLATE-FORMOTEROL 9-4.8 MCG/ACT IN AERO: 2.0000 | INHALATION_SPRAY | Freq: Two times a day (BID) | RESPIRATORY_TRACT | 5 refills | Status: DC

## 2018-03-11 NOTE — Progress Notes (Signed)
Timothy Davis    [id number]    1960/11/07  Primary Care Physician:[name], [name], MD  Referring Physician: [name], MD [name], [name] [id number]  Chief complaint: Follow-up for severe COPD  HPI: 57 year old with very severe COPD, emphysema.  Previously followed by Dr. Tyrone Schimke on bevespi. Declined lung transplant and pulmonary rehabilitation due to cost.  Complains of chronic dyspnea on exertion.  He gets short of breath after walking 50 yards. Cough with white mucus.  No fevers, chills  Pets: Has dogs and a cat.  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  Quit in 2003 Travel history: Originally from [place].  No significant recent travel Relevant family history:  Outpatient Encounter Medications as of 03/11/2018  Medication Sig  . Glycopyrrolate-Formoterol (BEVESPI AEROSPHERE) 9-4.8 MCG/ACT AERO Inhale 2 puffs into the lungs 2 (two) times daily.  Marland Kitchen HYDROcodone-acetaminophen (NORCO) 7.5-325 MG tablet Take 1 tablet by mouth 4 (four) times daily as needed (for back pain.).  Marland Kitchen PROAIR HFA 108 (90 Base) MCG/ACT inhaler INHALE 2 PUFFS EVERY 6 HOURS AS NEEDED (Patient taking differently: INHALE 2 PUFFS EVERY 6 HOURS AS NEEDED FOR WHEEZING/SHORTNESS OF BREATH.)  . traZODone (DESYREL) 100 MG tablet TAKE 1 TABLET (100 MG TOTAL) BY MOUTH ATBEDTIME   No facility-administered encounter medications on file as of 03/11/2018.     Allergies as of 03/11/2018  . (No Known Allergies)    Past Medical History:  Diagnosis Date  . Asthmatic bronchitis   . Chronic back pain   . COPD (chronic obstructive pulmonary disease) ([place])   . Hepatitis C antibody test positive    noticed trying to give blood  . Insomnia   . Shortness of breath     Past Surgical  History:  Procedure Laterality Date  . COLONOSCOPY  04/14/2012   Fields-6 polyps-tubular adenoma x 3 and hyperplastic polyps x 2/internal hemorrhoids/mild diverticulosis in the sigmoid colon, 1CM TA rectum  . COLONOSCOPY WITH PROPOFOL N/A 12/23/2017   Procedure: COLONOSCOPY WITH PROPOFOL;  Surgeon: Danie Binder, MD;  [facility];  Service: Endoscopy;  Laterality: N/A;  10:30am  . POLYPECTOMY  12/23/2017   Procedure: POLYPECTOMY;  Surgeon: Danie Binder, MD;  Location: [facility];  Service: Endoscopy;;  colon    Family History  Problem Relation Age of Onset  . COPD Mother   . Heart attack Mother   . Asthma Mother   . Lung disease Brother   . Colon cancer Neg Hx   . Colon polyps Neg Hx     Social History   Socioeconomic History  . Marital status: Married    Spouse name: Not on file  . Number of children: 2  . Years of education: Not on file  . Highest education level: Not on file  Occupational History  . Occupation: disabled    Employer: DISABLED  Social Needs  . Financial resource strain: Not on file  . Food insecurity:    Worry: Not on file    Inability: Not on file  . Transportation needs:    Medical: Not on file    Non-medical: Not on file  Tobacco Use  . Smoking status: Former Smoker    Packs/day: 1.50    Years: 27.00    Pack  years: 40.50    Types: Cigarettes    Start date: 12/16/1974    Last attempt to quit: 04/23/2002    Years since quitting: 15.8  . Smokeless tobacco: Current User    Types: Chew  Substance and Sexual Activity  . Alcohol use: No    Alcohol/week: 0.0 standard drinks    Comment: Hx heavy etoh (daily) x 10yrs, QUIT 21yrs ago  . Drug use: No    Comment: Hx marijuana, intranasal drugs, crack, cocaine.  QUIT 5 yrs ago  . Sexual activity: Yes    Partners: Female    Comment: monagamous  Lifestyle  . Physical activity:    Days per week: Not on file    Minutes per session: Not on file  . Stress: Not on file  Relationships  .  Social connections:    Talks on phone: Not on file    Gets together: Not on file    Attends religious service: Not on file    Active member of club or organization: Not on file    Attends meetings of clubs or organizations: Not on file    Relationship status: Not on file  . Intimate partner violence:    Fear of current or ex partner: Not on file    Emotionally abused: Not on file    Physically abused: Not on file    Forced sexual activity: Not on file  Other Topics Concern  . Not on file  Social History Narrative   Lives w/ wife, Wells Guiles Pulmonary (08/05/16):   Originally from [place], [place]. Moved to [place] when he was 57 y.o. Since then he has lived in [place]. 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 No known asbestos exposure. Currently has dogs and a cat. No bird or mold exposure. Enjoys fishing.     Review of systems: Review of Systems  Constitutional: Negative for fever and chills.  HENT: Negative.   Eyes: Negative for blurred vision.  Respiratory: as per HPI  Cardiovascular: Negative for chest pain and palpitations.  Gastrointestinal: Negative for vomiting, diarrhea, blood per rectum. Genitourinary: Negative for dysuria, urgency, frequency and hematuria.  Musculoskeletal: Negative for myalgias, back pain and joint pain.  Skin: Negative for itching and rash.  Neurological: Negative for dizziness, tremors, focal weakness, seizures and loss of consciousness.  Endo/Heme/Allergies: Negative for environmental allergies.  Psychiatric/Behavioral: Negative for depression, suicidal ideas and hallucinations.  All other systems reviewed and are negative.  Physical Exam: Blood pressure 128/70, pulse 60, height 5\' 11"  (1.803 m), weight 167 lb 6.4 oz (75.9 kg), SpO2 97 %. Gen:      No acute distress HEENT:  EOMI, sclera anicteric Neck:     No masses; no thyromegaly Lungs:    Clear to auscultation  bilaterally; normal respiratory effort CV:         Regular rate and rhythm; no murmurs Abd:      + bowel sounds; soft, non-tender; no palpable masses, no distension Ext:    No edema; adequate peripheral perfusion Skin:      Warm and dry; no rash Neuro: alert and oriented x 3 Psych: normal mood and affect  Data Reviewed: PFTs 07/13/2017 FVC 2.43 [46%), FEV1 0.92 [23%], F/F 38, TLC 125% Very severe obstruction with no bronchodilator response, + for air trapping and hyperinflation.  6MWT 09/18/16:  Walked 343 meters / Baseline Sat 98% on  RA / Nadir Sat 96% on RA   IMAGING CT chest 9/1/9- apical predominant emphysema, biapical scarring, rib fractures, aortic atherosclerosis  CT head and neck 07/29/2017- lung images to demonstrate apical emphysema, biapical scarring with calcification which is slightly more prominent compared to 2009. I have reviewed the images personally.  LABS 08/05/16 Alpha-1 antitrypsin: MM (155)   08/25/13 ANA:  Negative   03/05/12 ABG on RA:  7.42 / 38 / 85 / 96%   08/28/09 ABG on RA:  7.44 / 38 / 65 / 96%  Assessment:  Severe COPD Continues on bevespi with stable symptoms Discussed lung transplantation and pulmonary rehab again but he is reluctant to proceed with these. Reassess at next visit  Health Maintenance 05/10/2017-influenza 08/05/2016-Prevnar 03/02/2012-Pneumovax  Plan/Recommendations: - Continue Nicholos Johns MD [facility] 03/11/2018, 3:29 PM  CC: [name], MD

## 2018-03-11 NOTE — Patient Instructions (Signed)
I am glad that your breathing is stable Continue the Bevespi I will see you back in clinic in 6 months.  Please call us sooner if there is any change in his symptoms.

## 2018-03-19 ENCOUNTER — Ambulatory Visit (INDEPENDENT_AMBULATORY_CARE_PROVIDER_SITE_OTHER): Payer: No Typology Code available for payment source | Admitting: Family Medicine

## 2018-03-19 ENCOUNTER — Encounter: Payer: Self-pay | Admitting: Family Medicine

## 2018-03-19 VITALS — BP 122/74 | Ht 71.0 in | Wt 168.0 lb

## 2018-03-19 DIAGNOSIS — Z79891 Long term (current) use of opiate analgesic: Secondary | ICD-10-CM | POA: Diagnosis not present

## 2018-03-19 DIAGNOSIS — F5101 Primary insomnia: Secondary | ICD-10-CM | POA: Diagnosis not present

## 2018-03-19 DIAGNOSIS — J449 Chronic obstructive pulmonary disease, unspecified: Secondary | ICD-10-CM

## 2018-03-19 MED ORDER — HYDROCODONE-ACETAMINOPHEN 7.5-325 MG PO TABS: 1.0000 | ORAL_TABLET | Freq: Four times a day (QID) | ORAL | 0 refills | Status: DC | PRN

## 2018-03-19 NOTE — Progress Notes (Signed)
   Subjective:    Patient ID: Timothy Davis, male    DOB: 1961/07/26, 57 y.o.   MRN: [id number]  HPI This patient was seen today for chronic pain. Takes for back pain  The medication list was reviewed and updated.   -Compliance with medication: yes  - Number patient states they take daily: takes 4 per day  -when was the last dose patient took? today  The patient was advised the importance of maintaining medication and not using illegal substances with these.  Here for refills and follow up  The patient was educated that we can provide 3 monthly scripts for their medication, it is their responsibility to follow the instructions.  Side effects or complications from medications: none  Patient is aware that pain medications are meant to minimize the severity of the pain to allow their pain levels to improve to allow for better function. They are aware of that pain medications cannot totally remove their pain.  Due for UDT ( at least once per year) : due today  Pain is about the same   Patient compliant with insomnia medication. Generally takes most nights. No obvious morning drowsiness. Definitely helps patient sleep. Without it patient states would not get a good nights rest.  Patient once again notes shortness of breath.  Pulmonary no reviewed.  Patient disinclined to consider transplantation.  Currently maintaining compliant with inhalers.  Notes fairly severe shortness of breath with minimal exertion   Review of Systems No headache, no major weight loss or weight gain, no chest pain no back pain abdominal pain no change in bowel habits complete ROS otherwise negative     Objective:   Physical Exam  Alert and oriented, vitals reviewed and stable, NAD ENT-TM's and ext canals WNL bilat via otoscopic exam Soft palate, tonsils and post pharynx WNL via oropharyngeal exam Neck-symmetric, no masses; thyroid nonpalpable and nontender Pulmonary-no tachypnea or accessory muscle  use; Clear without wheezes via auscultation Card--no abnrml murmurs, rhythm reg and rate WNL Carotid pulses symmetric, without bruits       Assessment & Plan:  Impression chronic pain  Impression: Chronic pain. Patient compliant with medication. No substantial side effects. [place] controlled substance registry reviewed to ensure compliance and proper use of medication. Patient aware goal of medicine is not complete resolution of pain but to control his symptoms to improve his functional capacity. Aware of potential adverse side effects  Insomnia.  With definite need for medication.  Meds refilled.  COPD.  Severe.  Options discussed.  Patient disinclined to consider transplant etc.  Medications refilled diet exercise discussed.  Greater than 50% of this 25 minute face to face visit was spent in counseling and discussion and coordination of care regarding the above diagnosis/diagnosies

## 2018-03-24 LAB — TOXASSURE SELECT 13 (MW), URINE

## 2018-03-24 LAB — SPECIMEN STATUS REPORT

## 2018-04-28 ENCOUNTER — Telehealth: Payer: Self-pay | Admitting: Pulmonary Disease

## 2018-04-28 MED ORDER — GLYCOPYRROLATE-FORMOTEROL 9-4.8 MCG/ACT IN AERO: 2.0000 | INHALATION_SPRAY | Freq: Two times a day (BID) | RESPIRATORY_TRACT | 5 refills | Status: DC

## 2018-04-28 NOTE — Telephone Encounter (Signed)
Patient is requesting refill of medication. Patient is aware that medication refill has been sent in. Nothing further needed.

## 2018-06-16 ENCOUNTER — Encounter: Payer: Self-pay | Admitting: Family Medicine

## 2018-06-16 ENCOUNTER — Ambulatory Visit (INDEPENDENT_AMBULATORY_CARE_PROVIDER_SITE_OTHER): Payer: No Typology Code available for payment source | Admitting: Family Medicine

## 2018-06-16 VITALS — BP 132/88 | Ht 71.0 in | Wt 167.0 lb

## 2018-06-16 DIAGNOSIS — J449 Chronic obstructive pulmonary disease, unspecified: Secondary | ICD-10-CM

## 2018-06-16 DIAGNOSIS — Z79891 Long term (current) use of opiate analgesic: Secondary | ICD-10-CM

## 2018-06-16 DIAGNOSIS — F5101 Primary insomnia: Secondary | ICD-10-CM | POA: Diagnosis not present

## 2018-06-16 MED ORDER — HYDROCODONE-ACETAMINOPHEN 7.5-325 MG PO TABS: 1.0000 | ORAL_TABLET | Freq: Four times a day (QID) | ORAL | 0 refills | Status: DC | PRN

## 2018-06-16 NOTE — Progress Notes (Signed)
   Subjective:    Patient ID: Timothy Davis, male    DOB: 1961/05/25, 57 y.o.   MRN: [id number] Patient arrives with numerous concerns HPI This patient was seen today for chronic pain. Takes for back pain  The medication list was reviewed and updated.   -Compliance with medication: takes 4 per day  - Number patient states they take daily: 4 a day  -when was the last dose patient took? today  The patient was advised the importance of maintaining medication and not using illegal substances with these.  Here for refills and follow up  The patient was educated that we can provide 3 monthly scripts for their medication, it is their responsibility to follow the instructions.  Side effects or complications from medications: none  Patient is aware that pain medications are meant to minimize the severity of the pain to allow their pain levels to improve to allow for better function. They are aware of that pain medications cannot totally remove their pain.  Due for UDT ( at least once per year) : last one due 03/19/18  Pt states no concerns today.   Patient compliant with insomnia medication. Generally takes most nights. No obvious morning drowsiness. Definitely helps patient sleep. Without it patient states would not get a good nights rest.  Patient has chronic COPD.  Very severe.  Her pulmonologist.  They are now encouraging patient to consider lung transplant.  Patient asked my thoughts on this      Review of Systems No headache, no major weight loss or weight gain, no chest pain no back pain abdominal pain no change in bowel habits complete ROS otherwise negative     Objective:   Physical Exam  Alert and oriented, vitals reviewed and stable, NAD ENT-TM's and ext canals WNL bilat via otoscopic exam Soft palate, tonsils and post pharynx WNL via oropharyngeal exam Neck-symmetric, no masses; thyroid nonpalpable and nontender Pulmonary-no tachypnea or accessory muscle use; Clear  without wheezes via auscultation Card--no abnrml murmurs, rhythm reg and rate WNL Carotid pulses symmetric, without bruits Positive pain to percussion low back plus minus straight leg raise on left      Assessment & Plan:  Impression 1 chronic spinal pain with ongoing need for pain meds  Impression: Chronic pain. Patient compliant with medication. No substantial side effects. [place] controlled substance registry reviewed to ensure compliance and proper use of medication. Patient aware goal of medicine is not complete resolution of pain but to control his symptoms to improve his functional capacity. Aware of potential adverse side effects  #2 insomnia good control discussed maintain same  3.  COPD very severe.  Substantial discussion held.  Patient will be heading towards early death unless he takes a bowel movement which could include pulmonary transplant.  Discussed at length.  Asked patient to consider  Flu shot today meds refilled diet exercise discussed follow-up in several months  Greater than 50% of this 25 minute face to face visit was spent in counseling and discussion and coordination of care regarding the above diagnosis/diagnosies

## 2018-06-19 ENCOUNTER — Ambulatory Visit (INDEPENDENT_AMBULATORY_CARE_PROVIDER_SITE_OTHER): Payer: No Typology Code available for payment source | Admitting: *Deleted

## 2018-06-19 DIAGNOSIS — Z23 Encounter for immunization: Secondary | ICD-10-CM

## 2018-07-30 ENCOUNTER — Telehealth: Payer: Self-pay | Admitting: Family Medicine

## 2018-07-30 ENCOUNTER — Other Ambulatory Visit: Payer: Self-pay | Admitting: *Deleted

## 2018-07-30 MED ORDER — TRAZODONE HCL 100 MG PO TABS: ORAL_TABLET | ORAL | 5 refills | Status: DC

## 2018-07-30 NOTE — Telephone Encounter (Signed)
Six mo worth 

## 2018-07-30 NOTE — Telephone Encounter (Signed)
Refills called in to pharm. Pt notified.

## 2018-07-30 NOTE — Telephone Encounter (Signed)
Requesting refill for traZODone (DESYREL) 100 MG tablet   3 month follow up 09/15/2018 w/ Dr.Steve  Pharmacy:  [facility] - [place], [place]

## 2018-07-30 NOTE — Telephone Encounter (Signed)
Last seen 06/16/18. Please advise. Thank you

## 2018-08-12 ENCOUNTER — Encounter: Payer: Self-pay | Admitting: Pulmonary Disease

## 2018-08-12 ENCOUNTER — Ambulatory Visit (INDEPENDENT_AMBULATORY_CARE_PROVIDER_SITE_OTHER): Payer: No Typology Code available for payment source | Admitting: Pulmonary Disease

## 2018-08-12 ENCOUNTER — Ambulatory Visit (INDEPENDENT_AMBULATORY_CARE_PROVIDER_SITE_OTHER)
Admission: RE | Admit: 2018-08-12 | Discharge: 2018-08-12 | Disposition: A | Discharge status: Home / Self Care | Payer: No Typology Code available for payment source | Source: Ambulatory Visit | Attending: Pulmonary Disease | Admitting: Pulmonary Disease

## 2018-08-12 VITALS — BP 110/68 | HR 74 | Ht 72.0 in | Wt 163.8 lb

## 2018-08-12 DIAGNOSIS — R0602 Shortness of breath: Secondary | ICD-10-CM | POA: Diagnosis not present

## 2018-08-12 DIAGNOSIS — J449 Chronic obstructive pulmonary disease, unspecified: Secondary | ICD-10-CM

## 2018-08-12 MED ORDER — FLUTICASONE-UMECLIDIN-VILANT 100-62.5-25 MCG/INH IN AEPB: 1.0000 | INHALATION_SPRAY | Freq: Once | RESPIRATORY_TRACT | 3 refills | Status: AC

## 2018-08-12 MED ORDER — FLUTICASONE-UMECLIDIN-VILANT 100-62.5-25 MCG/INH IN AEPB: 1.0000 | INHALATION_SPRAY | Freq: Every day | RESPIRATORY_TRACT | 0 refills | Status: DC

## 2018-08-12 NOTE — Addendum Note (Signed)
Addended by: Vivia Ewing on: 08/12/2018 10:07 AM   Modules accepted: Orders

## 2018-08-12 NOTE — Progress Notes (Signed)
Timothy Davis    [id number]    1961-07-06  Primary Care Physician:Davis, Timothy Bushy, MD  Referring Physician: [name], MD [address] [place], [place] [address]  Chief complaint: Follow-up for severe COPD  HPI: 58 year old with very severe COPD, emphysema.  Previously followed by Dr. Tyrone Davis on bevespi. Declined lung transplant and pulmonary rehabilitation due to cost.  Complains of chronic dyspnea on exertion.  He gets short of breath after walking 50 yards. Cough with white mucus.  No fevers, chills  Pets: Has dogs and a cat.  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  Quit in 2003 Travel history: Originally from [place].  No significant recent travel Relevant family history:  Interim history: Complains of slightly worse productive cough with tan-colored mucus, dyspnea, wheezing, chest tightness   Outpatient Encounter Medications as of 08/12/2018  Medication Sig  . albuterol (PROAIR HFA) 108 (90 Base) MCG/ACT inhaler Inhale 2 puffs into the lungs every 6 (six) hours as needed.  . Glycopyrrolate-Formoterol (BEVESPI AEROSPHERE) 9-4.8 MCG/ACT AERO Inhale 2 puffs into the lungs 2 (two) times daily.  Marland Kitchen HYDROcodone-acetaminophen (NORCO) 7.5-325 MG tablet Take 1 tablet by mouth 4 (four) times daily as needed.  . traZODone (DESYREL) 100 MG tablet TAKE 1 TABLET (100 MG TOTAL) BY MOUTH ATBEDTIME   No facility-administered encounter medications on file as of 08/12/2018.    Physical Exam: Blood pressure 110/68, pulse 74, height 6' (1.829 m), weight 163 lb 12.8 oz (74.3 kg), SpO2 91 %. Gen:      No acute distress HEENT:  EOMI, sclera anicteric Neck:     No masses; no thyromegaly Lungs:    Diminished air entry, no wheeze. CV:         Regular rate and rhythm;  no murmurs Abd:      + bowel sounds; soft, non-tender; no palpable masses, no distension Ext:    No edema; adequate peripheral perfusion Skin:      Warm and dry; no rash Neuro: alert and oriented x 3 Psych: normal mood and affect  Data Reviewed: PFTs 07/13/2017 FVC 2.43 [46%), FEV1 0.92 [23%], F/F 38, TLC 125% Very severe obstruction with no bronchodilator response, + for air trapping and hyperinflation.  6MWT 09/18/16:  Walked 343 meters / Baseline Sat 98% on RA / Nadir Sat 96% on RA   IMAGING CT chest 9/1/9- apical predominant emphysema, biapical scarring, rib fractures, aortic atherosclerosis  CT head and neck 07/29/2017- lung images to demonstrate apical emphysema, biapical scarring with calcification which is slightly more prominent compared to 2009. I have reviewed the images personally.  LABS 08/05/16 Alpha-1 antitrypsin: MM (155)   08/25/13 ANA:  Negative   03/05/12 ABG on RA:  7.42 / 38 / 85 / 96%   08/28/09 ABG on RA:  7.44 / 38 / 65 / 96%  07/29/2017 CBC-WBC count 4.6, eos 7%, absolute eosinophil count 322  Assessment:  Severe COPD Breathing worsened since last time.  He does not appear to be actively wheezing Will give him a sample of Trelegy inhaler.  I believe he would benefit from inhaled corticosteroid as he has increased peripheral eosinophilia.  If this works better for him then we will call in a prescription Get chest x-ray, check ambulatory oxygen He is requesting handicap placard.  Discussed lung transplantation and pulmonary rehab but he is reluctant to proceed with these.  Health Maintenance 06/19/18-influenza 08/05/2016-Prevnar 03/02/2012-Pneumovax  Plan/Recommendations: - Samples of Trelegy - Chest x-ray, check O2 levels on exertion - Handicap placard  Timothy Garfinkel MD [facility] 08/12/2018, 9:17 AM  CC: Timothy Kirschner, MD

## 2018-08-12 NOTE — Addendum Note (Signed)
Addended by: Georjean Mode on: 08/12/2018 09:44 AM   Modules accepted: Orders

## 2018-08-12 NOTE — Patient Instructions (Signed)
We will check your oxygen levels on exertion today We will give you a sample of Trelegy inhaler.  Use this in place of Bevespi.  If it works better for you then we can call in a prescription Also give you a application for handicap placard We will get a chest x-ray today  Follow-up in 3 months.

## 2018-08-13 ENCOUNTER — Other Ambulatory Visit: Payer: Self-pay | Admitting: Pulmonary Disease

## 2018-08-13 DIAGNOSIS — J849 Interstitial pulmonary disease, unspecified: Secondary | ICD-10-CM

## 2018-08-27 ENCOUNTER — Ambulatory Visit (HOSPITAL_COMMUNITY)
Admission: RE | Admit: 2018-08-27 | Discharge: 2018-08-27 | Disposition: A | Discharge status: Home / Self Care | Payer: No Typology Code available for payment source | Source: Ambulatory Visit | Attending: Pulmonary Disease | Admitting: Pulmonary Disease

## 2018-08-27 DIAGNOSIS — J439 Emphysema, unspecified: Secondary | ICD-10-CM | POA: Diagnosis not present

## 2018-08-27 DIAGNOSIS — J849 Interstitial pulmonary disease, unspecified: Secondary | ICD-10-CM | POA: Insufficient documentation

## 2018-08-27 DIAGNOSIS — J449 Chronic obstructive pulmonary disease, unspecified: Secondary | ICD-10-CM | POA: Diagnosis not present

## 2018-09-15 ENCOUNTER — Encounter: Payer: Self-pay | Admitting: Family Medicine

## 2018-09-15 ENCOUNTER — Ambulatory Visit (INDEPENDENT_AMBULATORY_CARE_PROVIDER_SITE_OTHER): Payer: No Typology Code available for payment source | Admitting: Family Medicine

## 2018-09-15 VITALS — BP 126/84 | Ht 71.0 in | Wt 170.0 lb

## 2018-09-15 DIAGNOSIS — J449 Chronic obstructive pulmonary disease, unspecified: Secondary | ICD-10-CM | POA: Diagnosis not present

## 2018-09-15 DIAGNOSIS — E785 Hyperlipidemia, unspecified: Secondary | ICD-10-CM | POA: Diagnosis not present

## 2018-09-15 DIAGNOSIS — Z125 Encounter for screening for malignant neoplasm of prostate: Secondary | ICD-10-CM | POA: Diagnosis not present

## 2018-09-15 DIAGNOSIS — Z79899 Other long term (current) drug therapy: Secondary | ICD-10-CM | POA: Diagnosis not present

## 2018-09-15 DIAGNOSIS — G8929 Other chronic pain: Secondary | ICD-10-CM | POA: Diagnosis not present

## 2018-09-15 DIAGNOSIS — F5101 Primary insomnia: Secondary | ICD-10-CM

## 2018-09-15 DIAGNOSIS — K7400 Hepatic fibrosis, unspecified: Secondary | ICD-10-CM

## 2018-09-15 DIAGNOSIS — Z79891 Long term (current) use of opiate analgesic: Secondary | ICD-10-CM | POA: Diagnosis not present

## 2018-09-15 DIAGNOSIS — M5440 Lumbago with sciatica, unspecified side: Secondary | ICD-10-CM

## 2018-09-15 MED ORDER — TRAZODONE HCL 100 MG PO TABS: ORAL_TABLET | ORAL | 5 refills | Status: DC

## 2018-09-15 NOTE — Progress Notes (Signed)
   Subjective:    Patient ID: Timothy Davis, male    DOB: 04/12/1961, 58 y.o.   MRN: [id number]  HPI Patient is here today to follow up on his chronic health issues.  Copd: He takes albuterol 2 puffs prn,Bevespi two puffs bid  Pain management:  This patient was seen today for chronic pain  The medication list was reviewed and updated.   -Compliance with medication: Yes   - Number patient states they take daily: 4 per day  -when was the last dose patient took? This am.  The patient was advised the importance of maintaining medication and not using illegal substances with these.  Here for refills and follow up  The patient was educated that we can provide 3 monthly scripts for their medication, it is their responsibility to follow the instructions.  Side effects or complications from medications: None  Patient is aware that pain medications are meant to minimize the severity of the pain to allow their pain levels to improve to allow for better function. They are aware of that pain medications cannot totally remove their pain.  Due for UDT ( at least once per year) : 03/20/2019     Patient compliant with pain medication. Continues to experience the pain which led to initiation of analgesic intervention. No significant negative side effects. States definitely needs the pain medication to maintain current level of functioning. Does not receive controlled substance pain medication elsewhere.  Ongoing issues with breathing Last saw the pulm doc last month  Patient compliant with insomnia medication. Generally takes most nights. No obvious morning drowsiness. Definitely helps patient sleep. Without it patient states would not get a good nights rest.  Doing ok wit sleep but not the best   prediabets prezent. No fam hx of diab. Pt not a big sweets person but not exercising and be doing quite a few things that over to your pharmacy as well have a right there when you do to pick it up  again                 Review of Systems No headache, no major weight loss or weight gain, no chest pain no back pain abdominal pain no change in bowel habits complete ROS otherwise negative     Objective:   Physical Exam   Alert and oriented, vitals reviewed and stable, NAD ENT-TM's and ext canals WNL bilat via otoscopic exam Soft palate, tonsils and post pharynx WNL via oropharyngeal exam Neck-symmetric, no masses; thyroid nonpalpable and nontender Pulmonary-no tachypnea or accessory muscle use; Clear without wheezes via auscultation Card--no abnrml murmurs, rhythm reg and rate WNL Carotid pulses symmetric, without bruits      Assessment & Plan:  Impression chronic pain.  Discussed.  Medications maintain.  Impression: Chronic pain. Patient compliant with medication. No substantial side effects. [place] controlled substance registry reviewed to ensure compliance and proper use of medication. Patient aware goal of medicine is not complete resolution of pain but to control his symptoms to improve his functional capacity. Aware of potential adverse side effects   2.  Insomnia ongoing severe with chronic need for meds proper use discussed medications refilled  3.  Hyperlipidemia status uncertain check blood work.  4.  COPD.  Followed by specialist.  Ongoing challenges with smoking and chronic dyspnea.  5.  Prediabetes.  Discussed.  Maintain diet will assess blood work  Forensic scientist in 3 months

## 2018-09-16 ENCOUNTER — Other Ambulatory Visit: Payer: Self-pay | Admitting: Family Medicine

## 2018-09-16 MED ORDER — HYDROCODONE-ACETAMINOPHEN 7.5-325 MG PO TABS: ORAL_TABLET | ORAL | 0 refills | Status: DC

## 2018-09-16 MED ORDER — HYDROCODONE-ACETAMINOPHEN 7.5-325 MG PO TABS: 1.0000 | ORAL_TABLET | Freq: Four times a day (QID) | ORAL | 0 refills | Status: DC | PRN

## 2018-09-17 LAB — HEPATIC FUNCTION PANEL
AG Ratio: 1.9 (calc) (ref 1.0–2.5)
ALT: 11 U/L (ref 9–46)
AST: 22 U/L (ref 10–35)
Albumin: 4.5 g/dL (ref 3.6–5.1)
Alkaline phosphatase (APISO): 44 U/L (ref 35–144)
BILIRUBIN TOTAL: 0.7 mg/dL (ref 0.2–1.2)
Bilirubin, Direct: 0.1 mg/dL (ref 0.0–0.2)
Globulin: 2.4 g/dL (calc) (ref 1.9–3.7)
Indirect Bilirubin: 0.6 mg/dL (calc) (ref 0.2–1.2)
Total Protein: 6.9 g/dL (ref 6.1–8.1)

## 2018-09-17 LAB — LIPID PANEL
CHOLESTEROL: 184 mg/dL (ref <200)
HDL: 50 mg/dL (ref >=40)
LDL Cholesterol (Calc): 117 mg/dL (calc) — ABNORMAL HIGH
Non-HDL Cholesterol (Calc): 134 mg/dL (calc) — ABNORMAL HIGH (ref <130)
Total CHOL/HDL Ratio: 3.7 (calc) (ref <5.0)
Triglycerides: 73 mg/dL (ref <150)

## 2018-09-17 LAB — BASIC METABOLIC PANEL WITH GFR
BUN: 17 mg/dL (ref 7–25)
CO2: 30 mmol/L (ref 20–32)
Calcium: 9.6 mg/dL (ref 8.6–10.3)
Chloride: 102 mmol/L (ref 98–110)
Creat: 0.88 mg/dL (ref 0.70–1.33)
GFR, EST NON AFRICAN AMERICAN: 95 mL/min/{1.73_m2} (ref >=60)
GFR, Est African American: 111 mL/min/{1.73_m2} (ref >=60)
Glucose, Bld: 95 mg/dL (ref 65–99)
Potassium: 4.4 mmol/L (ref 3.5–5.3)
Sodium: 140 mmol/L (ref 135–146)

## 2018-09-17 LAB — PSA: PSA: 1.8 ng/mL (ref <=4.0)

## 2018-09-20 ENCOUNTER — Encounter: Payer: Self-pay | Admitting: Family Medicine

## 2018-10-22 ENCOUNTER — Telehealth: Payer: Self-pay | Admitting: Family Medicine

## 2018-10-22 NOTE — Telephone Encounter (Signed)
ERROR

## 2018-10-30 ENCOUNTER — Other Ambulatory Visit: Payer: Self-pay

## 2018-10-30 MED ORDER — GLYCOPYRROLATE-FORMOTEROL 9-4.8 MCG/ACT IN AERO: 2.0000 | INHALATION_SPRAY | Freq: Two times a day (BID) | RESPIRATORY_TRACT | 5 refills | Status: DC

## 2018-11-18 ENCOUNTER — Ambulatory Visit: Payer: No Typology Code available for payment source | Admitting: Pulmonary Disease

## 2018-11-26 ENCOUNTER — Ambulatory Visit: Payer: No Typology Code available for payment source | Admitting: Pulmonary Disease

## 2018-12-16 ENCOUNTER — Other Ambulatory Visit: Payer: Self-pay

## 2018-12-16 ENCOUNTER — Ambulatory Visit (INDEPENDENT_AMBULATORY_CARE_PROVIDER_SITE_OTHER): Payer: No Typology Code available for payment source | Admitting: Family Medicine

## 2018-12-16 ENCOUNTER — Encounter: Payer: Self-pay | Admitting: Family Medicine

## 2018-12-16 DIAGNOSIS — F5101 Primary insomnia: Secondary | ICD-10-CM

## 2018-12-16 DIAGNOSIS — E785 Hyperlipidemia, unspecified: Secondary | ICD-10-CM | POA: Diagnosis not present

## 2018-12-16 DIAGNOSIS — K7400 Hepatic fibrosis, unspecified: Secondary | ICD-10-CM

## 2018-12-16 DIAGNOSIS — Z79891 Long term (current) use of opiate analgesic: Secondary | ICD-10-CM

## 2018-12-16 DIAGNOSIS — J449 Chronic obstructive pulmonary disease, unspecified: Secondary | ICD-10-CM | POA: Diagnosis not present

## 2018-12-16 MED ORDER — HYDROCODONE-ACETAMINOPHEN 7.5-325 MG PO TABS: ORAL_TABLET | ORAL | 0 refills | Status: DC

## 2018-12-16 MED ORDER — HYDROCODONE-ACETAMINOPHEN 7.5-325 MG PO TABS: 1.0000 | ORAL_TABLET | Freq: Four times a day (QID) | ORAL | 0 refills | Status: DC | PRN

## 2018-12-16 NOTE — Progress Notes (Signed)
   Subjective:    Patient ID: Timothy Davis, male    DOB: October 31, 1960, 58 y.o.   MRN: [id number] In person HPI This patient was seen today for chronic pain. Takes for back pain.  The medication list was reviewed and updated.   -Compliance with medication: take 4 a day  - Number patient states they take daily: 4  -when was the last dose patient took? today  The patient was advised the importance of maintaining medication and not using illegal substances with these.  Here for refills and follow up  The patient was educated that we can provide 3 monthly scripts for their medication, it is their responsibility to follow the instructions.  Side effects or complications from medications: none  Patient is aware that pain medications are meant to minimize the severity of the pain to allow their pain levels to improve to allow for better function. They are aware of that pain medications cannot totally remove their pain.  Due for UDT ( at least once per year) : last one 03/19/18  Pt states no concerns today.     Ongoing need for sleep medicines.  Discussed.  Helps some but not a lot.  No major side effects in the morning.  Has questions about his COPD.  Compliant with his chronic steroid maintenance.  Wonders if if he can add rescue albuterol at the exact same time he uses these.    Review of Systems No headache, no major weight loss or weight gain, no chest pain no back pain abdominal pain no change in bowel habits complete ROS otherwise negative     Objective:   Physical Exam  Alert and oriented, vitals reviewed and stable, NAD ENT-TM's and ext canals WNL bilat via otoscopic exam Soft palate, tonsils and post pharynx WNL via oropharyngeal exam Neck-symmetric, no masses; thyroid nonpalpable and nontender Pulmonary-no tachypnea or accessory muscle use; Clear without wheezes via auscultation Card--no abnrml murmurs, rhythm reg and rate WNL Carotid pulses symmetric, without bruits  Diminished breath sounds diffusely on pulmonary exam      Assessment & Plan:

## 2019-03-15 ENCOUNTER — Ambulatory Visit: Payer: No Typology Code available for payment source | Admitting: Family Medicine

## 2019-03-25 ENCOUNTER — Other Ambulatory Visit: Payer: Self-pay

## 2019-03-25 ENCOUNTER — Ambulatory Visit (INDEPENDENT_AMBULATORY_CARE_PROVIDER_SITE_OTHER): Payer: No Typology Code available for payment source | Admitting: Family Medicine

## 2019-03-25 ENCOUNTER — Ambulatory Visit: Payer: No Typology Code available for payment source | Admitting: Family Medicine

## 2019-03-25 ENCOUNTER — Encounter: Payer: Self-pay | Admitting: Family Medicine

## 2019-03-25 VITALS — BP 134/90 | Temp 97.7°F | Wt 165.6 lb

## 2019-03-25 DIAGNOSIS — Z79891 Long term (current) use of opiate analgesic: Secondary | ICD-10-CM

## 2019-03-25 DIAGNOSIS — F5101 Primary insomnia: Secondary | ICD-10-CM | POA: Diagnosis not present

## 2019-03-25 DIAGNOSIS — J449 Chronic obstructive pulmonary disease, unspecified: Secondary | ICD-10-CM

## 2019-03-25 DIAGNOSIS — Z23 Encounter for immunization: Secondary | ICD-10-CM

## 2019-03-25 MED ORDER — TRAZODONE HCL 100 MG PO TABS: ORAL_TABLET | ORAL | 5 refills | Status: DC

## 2019-03-25 MED ORDER — HYDROCODONE-ACETAMINOPHEN 7.5-325 MG PO TABS: 1.0000 | ORAL_TABLET | Freq: Four times a day (QID) | ORAL | 0 refills | Status: DC | PRN

## 2019-03-25 MED ORDER — HYDROCODONE-ACETAMINOPHEN 7.5-325 MG PO TABS: ORAL_TABLET | ORAL | 0 refills | Status: DC

## 2019-03-25 NOTE — Progress Notes (Signed)
   Subjective:  Patient arrives with several distinct concerns  Patient ID: Timothy Davis, male    DOB: November 18, 1960, 58 y.o.   MRN: [id number]  HPI This patient was seen today for chronic pain  The medication list was reviewed and updated.   -Compliance with medication: hydrocodone 7.5-325 take one tablet po 4 times daily prn   - Number patient states they take daily: 4  -when was the last dose patient took? This morning  The patient was advised the importance of maintaining medication and not using illegal substances with these.  Here for refills and follow up  The patient was educated that we can provide 3 monthly scripts for their medication, it is their responsibility to follow the instructions.  Side effects or complications from medications: none  Patient is aware that pain medications are meant to minimize the severity of the pain to allow their pain levels to improve to allow for better function. They are aware of that pain medications cannot totally remove their pain.  Due for UDT ( at least once per year) : completed today  Patient compliant with pain medication. Continues to experience the pain which led to initiation of analgesic intervention. No significant negative side effects. States definitely needs the pain medication to maintain current level of functioning. Does not receive controlled substance pain medication elsewhere.  Patient compliant with insomnia medication. Generally takes most nights. No obvious morning drowsiness. Definitely helps patient sleep. Without it patient states would not get a good nights rest. Discussed potential referral for sleep studies patient declines.  Will stick with trazodone for now  Known severe COPD.  Currently no symptoms.  Patient had numerous questions in regards to this.  After discussion plan on getting flu shot.  Coronavirus concerns discussed Pt would also like refills on Trazodone. Pt states that sometimes he sleeps but then  again sometimes he doesn't.        Review of Systems No headache, no major weight loss or weight gain, no chest pain no back pain abdominal pain no change in bowel habits complete ROS otherwise negative     Objective:   Physical Exam Alert and oriented, vitals reviewed and stable, NAD ENT-TM's and ext canals WNL bilat via otoscopic exam Soft palate, tonsils and post pharynx WNL via oropharyngeal exam Neck-symmetric, no masses; thyroid nonpalpable and nontender Pulmonary-no tachypnea or accessory muscle use; diffuse diminished breath sounds and symmetric no tachypnea otherwise Clear without wheezes via auscultation Card--no abnrml murmurs, rhythm reg and rate WNL Carotid pulses symmetric, without bruits        Assessment & Plan:  Impression 1 insomnia discussed will maintain trazodone no other medications at this time  2.  COPD.  Serious nature discussed patient to maintain ongoing protection from coronavirus.  Numerous questions answered  3.  Chronic pain  Patient compliant with pain medication. Continues to experience the pain which led to initiation of analgesic intervention. No significant negative side effects. States definitely needs the pain medication to maintain current level of functioning. Does not receive controlled substance pain medication elsewhere.

## 2019-03-30 LAB — TOXASSURE SELECT 13 (MW), URINE

## 2019-03-31 ENCOUNTER — Other Ambulatory Visit: Payer: Self-pay | Admitting: Family Medicine

## 2019-06-22 ENCOUNTER — Encounter: Payer: Self-pay | Admitting: Family Medicine

## 2019-06-22 ENCOUNTER — Ambulatory Visit (INDEPENDENT_AMBULATORY_CARE_PROVIDER_SITE_OTHER): Payer: No Typology Code available for payment source | Admitting: Family Medicine

## 2019-06-22 ENCOUNTER — Other Ambulatory Visit: Payer: Self-pay

## 2019-06-22 DIAGNOSIS — Z79891 Long term (current) use of opiate analgesic: Secondary | ICD-10-CM

## 2019-06-22 DIAGNOSIS — J449 Chronic obstructive pulmonary disease, unspecified: Secondary | ICD-10-CM

## 2019-06-22 MED ORDER — HYDROCODONE-ACETAMINOPHEN 7.5-325 MG PO TABS: ORAL_TABLET | ORAL | 0 refills | Status: DC

## 2019-06-22 MED ORDER — HYDROCODONE-ACETAMINOPHEN 7.5-325 MG PO TABS: 1.0000 | ORAL_TABLET | Freq: Four times a day (QID) | ORAL | 0 refills | Status: DC | PRN

## 2019-06-22 MED ORDER — TRAZODONE HCL 100 MG PO TABS: ORAL_TABLET | ORAL | 5 refills | Status: DC

## 2019-06-22 MED ORDER — BEVESPI AEROSPHERE 9-4.8 MCG/ACT IN AERO: 2.0000 | INHALATION_SPRAY | Freq: Two times a day (BID) | RESPIRATORY_TRACT | 1 refills | Status: DC

## 2019-06-22 NOTE — Progress Notes (Signed)
   Subjective:  Audio only  Patient ID: Timothy Davis, male    DOB: 20-Dec-1960, 58 y.o.   MRN: [id number]  HPI This patient was seen today for chronic pain. Takes for back pain.  The medication list was reviewed and updated.   -Compliance with medication: 4 a day  - Number patient states they take daily: 4   -when was the last dose patient took? today  The patient was advised the importance of maintaining medication and not using illegal substances with these.  Here for refills and follow up  The patient was educated that we can provide 3 monthly scripts for their medication, it is their responsibility to follow the instructions.  Side effects or complications from medications: none  Patient is aware that pain medications are meant to minimize the severity of the pain to allow their pain levels to improve to allow for better function. They are aware of that pain medications cannot totally remove their pain.  Due for UDT ( at least once per year) : last one done 03/25/19  Pt requesting refill on trazodone, albuterol inhaler and bevespi inhaler.   Virtual Visit via Telephone Note  I connected with Timothy Davis on 06/22/19 at 10:00 AM EST by telephone and verified that I am speaking with the correct person using two identifiers.  Location: Patient: home Provider: office   I discussed the limitations, risks, security and privacy concerns of performing an evaluation and management service by telephone and the availability of in person appointments. I also discussed with the patient that there may be a patient responsible charge related to this service. The patient expressed understanding and agreed to proceed.   History of Present Illness:    Observations/Objective:   Assessment and Plan:   Follow Up Instructions:    I discussed the assessment and treatment plan with the patient. The patient was provided an opportunity to ask questions and all were answered. The patient  agreed with the plan and demonstrated an understanding of the instructions.   The patient was advised to call back or seek an in-person evaluation if the symptoms worsen or if the condition fails to improve as anticipated.  I provided 20 minutes of non-face-to-face time during this encounter.    Patient compliant with insomnia medication. Generally takes most nights. No obvious morning drowsiness. Definitely helps patient sleep. Without it patient states would not get a good nights rest.       Review of Systems No headache, no major weight loss or weight gain, no chest pain no back pain abdominal pain no change in bowel habits complete ROS otherwise negative     Objective:   Physical Exam  Virtual      Assessment & Plan:  Impression chronic back pain.  Discussed.  Pain control discussed.  Medications refilled  2.  COPD.  Severe.  Patient needs refill on his specialty medicine.  Claims he cannot get an appointment and claims they will not give refills.  2 months worth given.  Needs to reconnect with his specialist

## 2019-07-07 ENCOUNTER — Other Ambulatory Visit: Payer: Self-pay

## 2019-07-07 DIAGNOSIS — Z20822 Contact with and (suspected) exposure to covid-19: Secondary | ICD-10-CM

## 2019-07-08 ENCOUNTER — Telehealth: Payer: Self-pay

## 2019-07-08 LAB — NOVEL CORONAVIRUS, NAA: SARS-CoV-2, NAA: DETECTED — AB

## 2019-07-08 NOTE — Telephone Encounter (Signed)
Checking on COVID 19 results. Not available yet.

## 2019-07-09 ENCOUNTER — Telehealth: Payer: Self-pay | Admitting: Unknown Physician Specialty

## 2019-07-09 NOTE — Telephone Encounter (Signed)
Discussed with patient about Covid symptoms and the use of bamlanivimab, a monoclonal antibody infusion for those with mild to moderate Covid symptoms and at a high risk of hospitalization.  Pt is qualified for this infusion at the [facility] infusion center due to >55 with COPD  Pt is asymptomatic and not a candidate for monoclonal antibodies

## 2019-07-12 ENCOUNTER — Ambulatory Visit (INDEPENDENT_AMBULATORY_CARE_PROVIDER_SITE_OTHER): Payer: No Typology Code available for payment source | Admitting: Family Medicine

## 2019-07-12 DIAGNOSIS — U071 COVID-19: Secondary | ICD-10-CM | POA: Diagnosis not present

## 2019-07-12 MED ORDER — AMOXICILLIN 500 MG PO CAPS: 500.0000 mg | ORAL_CAPSULE | Freq: Three times a day (TID) | ORAL | 0 refills | Status: DC

## 2019-07-12 NOTE — Progress Notes (Signed)
   Subjective:  Audio only  Patient ID: Timothy Davis, male    DOB: 1960/09/07, 58 y.o.   MRN: [id number]  HPIpt tested posivite for covid 19 last Thursday. Some sob. Worse in the mornings. Some cough. No fever.   Virtual Visit via Telephone Note  I connected with Timothy Davis on 07/12/19 at  3:50 PM EST by telephone and verified that I am speaking with the correct person using two identifiers.  Location: Patient: home Provider: office   I discussed the limitations, risks, security and privacy concerns of performing an evaluation and management service by telephone and the availability of in person appointments. I also discussed with the patient that there may be a patient responsible charge related to this service. The patient expressed understanding and agreed to proceed.   History of Present Illness:    Observations/Objective:   Assessment and Plan:   Follow Up Instructions:    I discussed the assessment and treatment plan with the patient. The patient was provided an opportunity to ask questions and all were answered. The patient agreed with the plan and demonstrated an understanding of the instructions.   The patient was advised to call back or seek an in-person evaluation if the symptoms worsen or if the condition fails to improve as anticipated. 17 minutes of non-face-to-face time during this encounter.   Patient has known COVID-19.  Confirmed by testing last week.  Last week was fairly difficult.  Substantial cough congestion headache achiness.  Substantial amount of shortness of breath.  Patient states that shortness of breath has improved considerably.  He feels he is back to baseline cough occasionally productive   Review of Systems See above no vomiting no diarrhea no rash    Objective:   Physical Exam   Virtual     Assessment & Plan:  Impression COVID-19 with some improvement in symptoms last several days.  This patient is very concerned due to severe  COPD.  Fortunately it appears that his pulmonary status is improving.  We will add an antibiotic with known COPD history.  Warning signs discussed carefully.  Oxygen saturation monitoring discussed patient to consider

## 2019-08-01 NOTE — Progress Notes (Signed)
Virtual Visit via Telephone Note  I connected with Timothy Davis on 08/02/19 at 10:00 AM EST by telephone and verified that I am speaking with the correct person using two identifiers.  Location: Patient: Home Provider: [facility] - [id number], [place], [place], [place] [id number]   I discussed the limitations, risks, security and privacy concerns of performing an evaluation and management service by telephone and the availability of in person appointments. I also discussed with the patient that there may be a patient responsible charge related to this service. The patient expressed understanding and agreed to proceed.  Patient consented to consult via telephone: Yes People present and their role in pt care: Pt     History of Present Illness:  59 year old male former smoker followed in our office for severe COPD, emphysema.  Patient has previously declined lung transplants as well as pulmonary rehab due to cost.  Past medical history: Hep C, back pain, insomnia Smoking history: Former smoker.  Quit 2003.  40.5-pack-year. Current Smokeless tobacco user.  Maintenance: Bevespi  Patient of Dr. Vaughan Browner  Pets: Has dogs and a cat.  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  Quit in 2003 Travel history: Originally from [place].  No significant recent travel  Chief complaint: 1 year follow-up, COPD   59 year old male former smoker current smokeless tobacco user followed in our office for very severe COPD.  Unfortunately patient contracted SARS-CoV-2 on 07/07/2019.  Patient has not needed to be hospitalized for this.  Patient continues to have worsened dyspnea.  Patient continues to be maintained on Bevespi.  Patient reports that he was never trialed on Trelegy Ellipta.  Patient does have  peripheral eosinophilia on lab work in 2019.  There have been multiple barriers to getting the patient into our office.  2 of the patient's recent appointments earlier this year were canceled due to the COVID-19 pandemic as well as scheduling with Dr. Vaughan Browner as he was working full-time in the hospital.  It appears the patient was not offered an APP visit.  Patient also recently now has contracted Covid which has delayed getting the patient into our office.  Patient spouse also reports that she had contacted our office earlier this year and was on hold for significant amount of time and then the call was dropped.  I worked with the patient as well as his spouse for the patient to download my chart.  A text message was sent with the link.    MMRC:  mMRC Dyspnea Scale mMRC Score  08/02/2019 3   Smoking assessment and cessation counseling  Patient currently smoking: using smokeless tobacco - 1 can a day  I have advised the patient to quit/stop smoking as soon as possible due to high risk for multiple medical problems.  It will also be very difficult for [place] to manage patient's  respiratory symptoms and status if we continue to expose her lungs to a known irritant.  We do not advise e-cigarettes as a form of stopping smoking.  Patient is not willing to quit dipping  I have advised the patient that we can assist and have options of nicotine replacement therapy, provided smoking cessation education today, provided smoking cessation counseling, and provided cessation resources.  Follow-up next office visit office visit for assessment of smoking cessation.  Smoking cessation  counseling advised for: 4 min   Observations/Objective:  Data Reviewed: PFTs 07/13/2017 FVC 2.43 [46%), FEV1 0.92 [23%], F/F 38, TLC 125% Very severe obstruction with no bronchodilator response, + for air trapping and hyperinflation.  6MWT 09/18/16:  Walked 343 meters / Baseline Sat 98% on RA / Nadir Sat 96% on RA    IMAGING CT chest 9/1/9- apical predominant emphysema, biapical scarring, rib fractures, aortic atherosclerosis  CT head and neck 07/29/2017- lung images to demonstrate apical emphysema, biapical scarring with calcification which is slightly more prominent compared to 2009.  LABS 08/05/16 Alpha-1 antitrypsin: MM (155)   08/25/13 ANA:  Negative   03/05/12 ABG on RA:  7.42 / 38 / 85 / 96%   08/28/09 ABG on RA:  7.44 / 38 / 65 / 96%  07/29/2017 CBC-WBC count 4.6, eos 7%, absolute eosinophil count 322  07/07/2019 - sars cov2 - positive   Social History   Tobacco Use  Smoking Status Former Smoker  . Packs/day: 1.50  . Years: 27.00  . Pack years: 40.50  . Types: Cigarettes  . Start date: 12/16/1974  . Quit date: 04/23/2002  . Years since quitting: 17.2  Smokeless Tobacco Current User  . Types: Chew   Immunization History  Administered Date(s) Administered  . Hepatitis A, Adult 11/15/2013, 05/05/2014  . Hepatitis B, adult 11/15/2013, 12/30/2013, 05/05/2014  . Influenza Split 07/13/2013  . Influenza,inj,Quad PF,6+ Mos 05/05/2014, 04/10/2015, 06/10/2016, 06/10/2017, 06/19/2018, 03/25/2019  . Influenza-Unspecified 06/28/2012  . Pneumococcal Conjugate-13 08/05/2016  . Pneumococcal Polysaccharide-23 03/02/2012      Assessment and Plan:  COPD, very severe ([place]) Plan: Trial of Breztri Stop Bevespi Continue rescue inhaler as needed Follow-up with our office for an in office visit in 4 to 6 weeks May need to consider repeating pulmonary function testing as patient is status post Covid, 2018 PFTs showing very severe COPD   Former smoker Plan: Continue to not smoke  Smokeless tobacco use Plan: Follow-up with primary care and discuss ways to stop using smokeless tobacco Our recommendations are that you stop using tobacco  Medication management Plan: Trial of Breztri  Stop Bevespi Reviewed this with patient as well as patient's spouse   COVID-19 virus detected Plan: We  will continue to monitor patient clinically May need to consider repeating pulmonary function testing in the future as well as may be CT imaging    Follow Up Instructions:  Return in about 6 weeks (around 09/13/2019), or if symptoms worsen or fail to improve, for Follow up with Dr. Vaughan Browner.   I discussed the assessment and treatment plan with the patient. The patient was provided an opportunity to ask questions and all were answered. The patient agreed with the plan and demonstrated an understanding of the instructions.   The patient was advised to call back or seek an in-person evaluation if the symptoms worsen or if the condition fails to improve as anticipated.  I provided 25 minutes of non-face-to-face time during this encounter.   Lauraine Rinne, NP

## 2019-08-02 ENCOUNTER — Other Ambulatory Visit: Payer: Self-pay

## 2019-08-02 ENCOUNTER — Encounter: Payer: Self-pay | Admitting: Pulmonary Disease

## 2019-08-02 ENCOUNTER — Ambulatory Visit (INDEPENDENT_AMBULATORY_CARE_PROVIDER_SITE_OTHER): Payer: No Typology Code available for payment source | Admitting: Pulmonary Disease

## 2019-08-02 DIAGNOSIS — Z79899 Other long term (current) drug therapy: Secondary | ICD-10-CM | POA: Diagnosis not present

## 2019-08-02 DIAGNOSIS — J449 Chronic obstructive pulmonary disease, unspecified: Secondary | ICD-10-CM

## 2019-08-02 DIAGNOSIS — U071 COVID-19: Secondary | ICD-10-CM | POA: Diagnosis not present

## 2019-08-02 DIAGNOSIS — F17290 Nicotine dependence, other tobacco product, uncomplicated: Secondary | ICD-10-CM | POA: Diagnosis not present

## 2019-08-02 DIAGNOSIS — Z87891 Personal history of nicotine dependence: Secondary | ICD-10-CM | POA: Insufficient documentation

## 2019-08-02 DIAGNOSIS — Z720 Tobacco use: Secondary | ICD-10-CM

## 2019-08-02 DIAGNOSIS — Z8616 Personal history of COVID-19: Secondary | ICD-10-CM | POA: Insufficient documentation

## 2019-08-02 MED ORDER — BREZTRI AEROSPHERE 160-9-4.8 MCG/ACT IN AERO: 2.0000 | INHALATION_SPRAY | Freq: Two times a day (BID) | RESPIRATORY_TRACT | 2 refills | Status: DC

## 2019-08-02 NOTE — Assessment & Plan Note (Signed)
Plan: Follow-up with primary care and discuss ways to stop using smokeless tobacco Our recommendations are that you stop using tobacco

## 2019-08-02 NOTE — Assessment & Plan Note (Signed)
Plan: Trial of Breztri Stop Bevespi Continue rescue inhaler as needed Follow-up with our office for an in office visit in 4 to 6 weeks May need to consider repeating pulmonary function testing as patient is status post Covid, 2018 PFTs showing very severe COPD

## 2019-08-02 NOTE — Assessment & Plan Note (Signed)
Plan: Continue to not smoke 

## 2019-08-02 NOTE — Assessment & Plan Note (Signed)
Plan: We will continue to monitor patient clinically May need to consider repeating pulmonary function testing in the future as well as may be CT imaging

## 2019-08-02 NOTE — Patient Instructions (Signed)
You were seen today by Lauraine Rinne, NP  for:   I am sorry for the multiple inconveniences with our office in order to get in to be seen.  I highly recommend downloading my chart utilizing this app to be able to have quicker communication with our office.  If you run into any problems with your symptoms, medications, getting scheduled in our office, please contact our office either through the MyChart app or via telephone and asked to speak with me or a clinical lead.  We are always available to see patients or talk with you over the phone to triage symptoms to get to the appropriate care.  I am sorry that you are experiencing calendar year 2020 was poor.  I hope that in calendar year 2021 our office can exceed your expectations.  Timothy Davis   1. COPD, very severe ([place])  Trial of Breztri  >>>2 puffs daily twice a day (4 puffs total daily) >>>This is not a rescue inhaler >>>You take this daily no matter what  Stop Bevespi   Only use your albuterol as a rescue medication to be used if you can't catch your breath by resting or doing a relaxed purse lip breathing pattern.  - The less you use it, the better it will work when you need it. - Ok to use up to 2 puffs  every 4 hours if you must but call for immediate appointment if use goes up over your usual need - Don't leave home without it !!  (think of it like the spare tire for your car)    Note your daily symptoms > remember "red flags" for COPD:   >>>Increase in cough >>>increase in sputum production >>>increase in shortness of breath or activity  intolerance.   If you notice these symptoms, please call the office to be seen.   2. Smokeless tobacco use  We recommend that you stop using your smokeless tobacco Follow-up with primary care to work on stopping  3. Former smoker  Continue to not smoke  4. Medication management  Trial of Breztri today   5. COVID-19 virus detected  We will continue to monitor you clinically I am glad  you are stable today  We may need to consider repeating imaging as well as pulmonary function testing sometime later this year   We recommend today:   Meds ordered this encounter  Medications  . Budeson-Glycopyrrol-Formoterol (BREZTRI AEROSPHERE) 160-9-4.8 MCG/ACT AERO    Sig: Inhale 2 puffs into the lungs 2 (two) times daily.    Dispense:  10.7 g    Refill:  2    Follow Up:    Return in about 6 weeks (around 09/13/2019), or if symptoms worsen or fail to improve, for Follow up with Dr. Vaughan Browner.   Please do your part to reduce the spread of COVID-19:      Reduce your risk of any infection  and COVID19 by using the similar precautions used for avoiding the common cold or flu:  Marland Kitchen Wash your hands often with soap and warm water for at least 20 seconds.  If soap and water are not readily available, use an alcohol-based hand sanitizer with at least 60% alcohol.  . If coughing or sneezing, cover your mouth and nose by coughing or sneezing into the elbow areas of your shirt or coat, into a tissue or into your sleeve (not your hands). Langley Gauss A MASK when in public  . Avoid shaking hands with others and consider head  nods or verbal greetings only. . Avoid touching your eyes, nose, or mouth with unwashed hands.  . Avoid close contact with people who are sick. . Avoid places or events with large numbers of people in one location, like concerts or sporting events. . If you have some symptoms but not all symptoms, continue to monitor at home and seek medical attention if your symptoms worsen. . If you are having a medical emergency, call 911.   [place] / e-Visit: [email or web address]         [facility]: [facility]: [id number]                   [facility]: [id number]     It is flu season:   >>> Best ways to protect herself from  the flu: Receive the yearly flu vaccine, practice good hand hygiene washing with soap and also using hand sanitizer when available, eat a nutritious meals, get adequate rest, hydrate appropriately   Please contact the office if your symptoms worsen or you have concerns that you are not improving.   Thank you for choosing [facility] for your healthcare, and for allowing [place] to partner with you on your healthcare journey. I am thankful to be able to provide care to you today.   Wyn Quaker FNP-C

## 2019-08-02 NOTE — Assessment & Plan Note (Signed)
Plan: Trial of Breztri  Stop Bevespi Reviewed this with patient as well as patient's spouse

## 2019-08-19 ENCOUNTER — Other Ambulatory Visit: Payer: Self-pay | Admitting: Family Medicine

## 2019-08-25 ENCOUNTER — Telehealth: Payer: Self-pay | Admitting: Pulmonary Disease

## 2019-08-25 NOTE — Telephone Encounter (Signed)
Spoke with pharmacist, Jonni Sanger  He states that Timothy Davis is not covered by the [employer], but anoro and stiolto are covered  He is asking if we can switch pt to one on the preferred meds  Please advise thanks!

## 2019-08-26 MED ORDER — ANORO ELLIPTA 62.5-25 MCG/INH IN AEPB: 1.0000 | INHALATION_SPRAY | Freq: Every day | RESPIRATORY_TRACT | 5 refills | Status: DC

## 2019-08-26 NOTE — Telephone Encounter (Signed)
Try Anoro. Thanks

## 2019-08-26 NOTE — Telephone Encounter (Signed)
Called pt's pharmacy and spoke with Tammy, pharmacist letting her know that we were switching pt to anoro. She verbalized understanding. Nothing further needed.

## 2019-09-01 ENCOUNTER — Encounter: Payer: Self-pay | Admitting: Family Medicine

## 2019-09-06 ENCOUNTER — Ambulatory Visit: Payer: No Typology Code available for payment source | Admitting: Pulmonary Disease

## 2019-09-06 ENCOUNTER — Ambulatory Visit (INDEPENDENT_AMBULATORY_CARE_PROVIDER_SITE_OTHER): Payer: No Typology Code available for payment source | Admitting: Adult Health

## 2019-09-06 ENCOUNTER — Other Ambulatory Visit: Payer: Self-pay

## 2019-09-06 ENCOUNTER — Encounter: Payer: Self-pay | Admitting: Adult Health

## 2019-09-06 DIAGNOSIS — J449 Chronic obstructive pulmonary disease, unspecified: Secondary | ICD-10-CM | POA: Diagnosis not present

## 2019-09-06 DIAGNOSIS — R9389 Abnormal findings on diagnostic imaging of other specified body structures: Secondary | ICD-10-CM | POA: Diagnosis not present

## 2019-09-06 MED ORDER — STIOLTO RESPIMAT 2.5-2.5 MCG/ACT IN AERS: 2.0000 | INHALATION_SPRAY | Freq: Every day | RESPIRATORY_TRACT | 5 refills | Status: DC

## 2019-09-06 MED ORDER — ALBUTEROL SULFATE (2.5 MG/3ML) 0.083% IN NEBU: 2.5000 mg | INHALATION_SOLUTION | Freq: Four times a day (QID) | RESPIRATORY_TRACT | 12 refills | Status: DC | PRN

## 2019-09-06 MED ORDER — STIOLTO RESPIMAT 2.5-2.5 MCG/ACT IN AERS: 2.0000 | INHALATION_SPRAY | Freq: Every day | RESPIRATORY_TRACT | 0 refills | Status: DC

## 2019-09-06 MED ORDER — BUDESONIDE 0.25 MG/2ML IN SUSP: 0.2500 mg | Freq: Two times a day (BID) | RESPIRATORY_TRACT | 12 refills | Status: DC

## 2019-09-06 NOTE — Assessment & Plan Note (Signed)
Clinically has recovered

## 2019-09-06 NOTE — Progress Notes (Signed)
@Patient  ID: Timothy Davis, male    DOB: [birth year], 59 y.o.   MRN: [id number]  Chief Complaint  Patient presents with  . Follow-up    COPD     Referring provider: [name], MD  HPI: 59 year old male former smoker followed for very severe COPD with emphysema Previous work as a Control and instrumentation engineer history significant for hepatitis C, COVID-17 July 2019  TEST/EVENTS :  09/13/16: FVC 2.33 L (44%) FEV1 0.93 L (23%) FEV1/FVC 0.40 FEF 25-75 0.37 L (11%) negative bronchodilator response TLC 7.38 L (6125%] ERV 290% ERV 1% (DLCO could not be performed) 03/05/12:FVC 4.01 L (74%) FEV1 1.00 L (24%) FEV1/FVC 0.25 FEF 25-75 0.22 L (6%) positive bronchodilator response TLC 10.11 L (137%) RV 284% ERV 109% DLCO uncorrected 52%  6MWT 09/18/16: Walked 343 meters / Baseline Sat 98% on RA / Nadir Sat 96% on RA  IMAGING CXR PA/LAT 03/17/12 :Bilateral linear opacification consistent with scarring and previous chest x-ray imaging. No new mass or opacity appreciated. Hyperinflation with barreling of the chest and deep sulci bilaterally. No pleural effusion or thickening appreciated. Heart normal in size &mediastinum normal in contour.  CT CHEST W/ CONTRAST 9/1/9 No pleural effusion or thickening. Left rib fracture noted. Apical predominant emphysematous changes. No pathologic mediastinal adenopathy. No pericardial effusion. No parenchymal nodule or opacity other than linear opacity in the apices consistent with scar formation.  LABS 08/05/16 Alpha-1 antitrypsin: MM (155)   08/25/13 ANA: Negative  03/05/12 ABG on RA: 7.42 / 38 / 85 / 96%  Decline lung transplant referral 2018 (due to cost)  Decline Pulmonary rehab referral 2018   09/06/2019 Follow up : COPD  Patient returns for a 1 month follow-up for COPD  Was previously on [place] but now insurance will not cover. Changed to BREZTRI but not covered. Now on [facility] , covered but does not like it.  HRCT chest done last week showed few  scattered nodules 4 mm, masslike regions of consolidation in the upper lobes surrounding parenchymal banding and parenchymal distortion present since 2009 with progression suggesting progressive massive fibrosis.  Severe emphysema.  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  Patient is a previous Building control surveyor.  Patient says overall breathing is doing about the same.  He gets short of breath with minimal activities.  He is able to do light activities at home.  He is able to walk his dogs each day. He denies any hemoptysis chest pain orthopnea PND or increased leg swelling Weight is stable  No Known Allergies  Immunization History  Administered Date(s) Administered  . Hepatitis A, Adult 11/15/2013, 05/05/2014  . Hepatitis B, adult 11/15/2013, 12/30/2013, 05/05/2014  . Influenza Split 07/13/2013  . Influenza,inj,Quad PF,6+ Mos 05/05/2014, 04/10/2015, 06/10/2016, 06/10/2017, 06/19/2018, 03/25/2019  . Influenza-Unspecified 06/28/2012  . Pneumococcal Conjugate-13 08/05/2016  . Pneumococcal Polysaccharide-23 03/02/2012    Past Medical History:  Diagnosis Date  . Asthmatic bronchitis   . Chronic back pain   . COPD (chronic obstructive pulmonary disease) ([place])   . Hepatitis C antibody test positive    noticed trying to give blood  . Insomnia   . Shortness of breath     Tobacco History: Social History   Tobacco Use  Smoking Status Former Smoker  . Packs/day: 1.50  . Years: 27.00  . Pack years: 40.50  . Types: Cigarettes  . Start date: 12/16/1974  . Quit date: 04/23/2002  . Years since quitting: 17.3  Smokeless Tobacco Current User  . Types: Chew  Ready to quit: Not Answered Counseling given: Not Answered   Outpatient Medications Prior to Visit  Medication Sig Dispense Refill  . albuterol (VENTOLIN HFA) 108 (90 Base) MCG/ACT inhaler INHALE TWO PUFFS INTO THE LUNGS EVERY SIX HOURS AS NEEDED 8.5 g 5  . amoxicillin (AMOXIL) 500 MG capsule Take 1 capsule (500 mg total)  by mouth 3 (three) times daily. 30 capsule 0  . Budeson-Glycopyrrol-Formoterol (BREZTRI AEROSPHERE) 160-9-4.8 MCG/ACT AERO Inhale 2 puffs into the lungs 2 (two) times daily. 10.7 g 2  . HYDROcodone-acetaminophen (NORCO) 7.5-325 MG tablet Take 1 tablet by mouth 4 (four) times daily as needed. 120 tablet 0  . HYDROcodone-acetaminophen (NORCO) 7.5-325 MG tablet Take 1 tablet by mouth 4 (four) times daily as needed 120 tablet 0  . HYDROcodone-acetaminophen (NORCO) 7.5-325 MG tablet Take 1 tablet by mouth 4 (four) times daily as needed 120 tablet 0  . traZODone (DESYREL) 100 MG tablet TAKE 1 TABLET (100 MG TOTAL) BY MOUTH ATBEDTIME 30 tablet 5  . umeclidinium-vilanterol (ANORO ELLIPTA) 62.5-25 MCG/INH AEPB Inhale 1 puff into the lungs daily. 60 each 5   No facility-administered medications prior to visit.     Review of Systems:   Constitutional:   No  weight loss, night sweats,  Fevers, chills,  +fatigue, or  lassitude.  HEENT:   No headaches,  Difficulty swallowing,  Tooth/dental problems, or  Sore throat,                No sneezing, itching, ear ache, nasal congestion, post nasal drip,   CV:  No chest pain,  Orthopnea, PND, swelling in lower extremities, anasarca, dizziness, palpitations, syncope.   GI  No heartburn, indigestion, abdominal pain, nausea, vomiting, diarrhea, change in bowel habits, loss of appetite, bloody stools.   Resp:    No chest wall deformity  Skin: no rash or lesions.  GU: no dysuria, change in color of urine, no urgency or frequency.  No flank pain, no hematuria   MS:  No joint pain or swelling.  No decreased range of motion.  No back pain.    Physical Exam  BP 124/72 (BP Location: Left Arm, Patient Position: Sitting, Cuff Size: Normal)   Pulse 60   Temp (!) 97.3 F (36.3 C) (Temporal)   Ht 6' (1.829 m)   Wt 171 lb (77.6 kg)   SpO2 97% Comment: on RA  BMI 23.19 kg/m   GEN: A/Ox3; pleasant , NAD, thin male    HEENT:  [place]/AT,   NOSE-clear,  THROAT-clear, no lesions, no postnasal drip or exudate noted.   NECK:  Supple w/ fair ROM; no JVD; normal carotid impulses w/o bruits; no thyromegaly or nodules palpated; no lymphadenopathy.    RESP  Clear  P & A; w/o, wheezes/ rales/ or rhonchi. no accessory muscle use, no dullness to percussion  CARD:  RRR, no m/r/g, no peripheral edema, pulses intact, no cyanosis or clubbing.  GI:   Soft & nt; nml bowel sounds; no organomegaly or masses detected.   Musco: Warm bil, no deformities or joint swelling noted.   Neuro: alert, no focal deficits noted.    Skin: Warm, no lesions or rashes    Lab Results:  CBC   BMET  BNP No results found for: BNP  ProBNP No results found for: PROBNP  Imaging: No results found.    PFT Results Latest Ref Rng & Units 09/13/2016  FVC-Pre L 2.33  FVC-Predicted Pre % 44  FVC-Post L 2.43  FVC-Predicted Post %  46  Pre FEV1/FVC % % 40  Post FEV1/FCV % % 38  FEV1-Pre L 0.93  FEV1-Predicted Pre % 23  FEV1-Post L 0.92  TLC L 9.25  TLC % Predicted % 125  RV % Predicted % 290    No results found for: NITRICOXIDE      Assessment & Plan:   COPD, very severe ([facility]) COPD is stable.-Patient's current regimen with Bevespi was not covered by insurance.  Was unable to cover respiratory.  Patient does not like Anoro.  We will try Stiolto and see if covered by insurance.  If unable to get insurance coverage will either consider getting patient assistance or return back to Anoro.  We will also add in Pulmicort nebulizer twice daily.  Add in albuterol nebulizers as needed.  Those will be sent to a DME company along with order for nebulizer machine. Needs PFTs on return.  Plan  Patient Instructions  Begin Pulmicort Neb Twice daily .  Change ANORO to Stiolto 2 puffs daily . If this is not covered then go back to [facility] daily.  Add Albuterol neb every 6hrs as needed for wheezing/shortness of breath.  Activity as tolerated . High protein diet.  Follow  up with Dr. Vaughan Browner in 2-3 months with PFT and As needed   Please contact office for sooner follow up if symptoms do not improve or worsen or seek emergency care       COVID-19 virus detected Clinically has recovered  Abnormal CT of the chest Patient has significant masslike consolidations and parenchymal banding and distortion dating back to 2009.  Most recent CT chest has shown some progression.  Patient is a previous Building control surveyor.  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  We will continue to follow.  Check PFTs with spirometry and DLCO on return O2 saturations are adequate today.     Rexene Edison, NP 09/06/2019

## 2019-09-06 NOTE — Addendum Note (Signed)
Addended by: Lia Foyer R on: 09/06/2019 11:48 AM   Modules accepted: Orders

## 2019-09-06 NOTE — Assessment & Plan Note (Signed)
Patient has significant masslike consolidations and parenchymal banding and distortion dating back to 2009.  Most recent CT chest has shown some progression.  Patient is a previous Building control surveyor.  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  We will continue to follow.  Check PFTs with spirometry and DLCO on return O2 saturations are adequate today.

## 2019-09-06 NOTE — Assessment & Plan Note (Signed)
COPD is stable.-Patient's current regimen with Bevespi was not covered by insurance.  Was unable to cover respiratory.  Patient does not like Anoro.  We will try Stiolto and see if covered by insurance.  If unable to get insurance coverage will either consider getting patient assistance or return back to Anoro.  We will also add in Pulmicort nebulizer twice daily.  Add in albuterol nebulizers as needed.  Those will be sent to a DME company along with order for nebulizer machine. Needs PFTs on return.  Plan  Patient Instructions  Begin Pulmicort Neb Twice daily .  Change ANORO to Stiolto 2 puffs daily . If this is not covered then go back to [facility] daily.  Add Albuterol neb every 6hrs as needed for wheezing/shortness of breath.  Activity as tolerated . High protein diet.  Follow up with Dr. Vaughan Browner in 2-3 months with PFT and As needed   Please contact office for sooner follow up if symptoms do not improve or worsen or seek emergency care

## 2019-09-06 NOTE — Patient Instructions (Addendum)
Begin Pulmicort Neb Twice daily .  Change ANORO to Stiolto 2 puffs daily . If this is not covered then go back to [facility] daily.  Add Albuterol neb every 6hrs as needed for wheezing/shortness of breath.  Activity as tolerated . High protein diet.  Follow up with Dr. Vaughan Browner in 2-3 months with PFT and As needed   Please contact office for sooner follow up if symptoms do not improve or worsen or seek emergency care

## 2019-09-20 ENCOUNTER — Other Ambulatory Visit: Payer: Self-pay | Admitting: Family Medicine

## 2019-09-20 NOTE — Telephone Encounter (Signed)
Ok o v nex month

## 2019-10-12 ENCOUNTER — Telehealth: Payer: Self-pay | Admitting: Family Medicine

## 2019-10-12 NOTE — Telephone Encounter (Signed)
Ok times one 

## 2019-10-12 NOTE — Telephone Encounter (Signed)
Last pain management 06/22/19

## 2019-10-12 NOTE — Telephone Encounter (Signed)
Pt is requesting refill on HYDROcodone-acetaminophen (NORCO) 7.5-325 MG tablet   [place], [place] - [address]  Pt has med check 3/29

## 2019-10-13 MED ORDER — HYDROCODONE-ACETAMINOPHEN 7.5-325 MG PO TABS: ORAL_TABLET | ORAL | 0 refills | Status: DC

## 2019-10-13 NOTE — Telephone Encounter (Signed)
Med pended will call pt after its signed.  °

## 2019-10-13 NOTE — Telephone Encounter (Signed)
Pt notified rx sent to pharm

## 2019-10-15 DIAGNOSIS — Z23 Encounter for immunization: Secondary | ICD-10-CM | POA: Diagnosis not present

## 2019-10-25 ENCOUNTER — Encounter: Payer: Self-pay | Admitting: Family Medicine

## 2019-10-25 ENCOUNTER — Other Ambulatory Visit: Payer: Self-pay

## 2019-10-25 ENCOUNTER — Ambulatory Visit (INDEPENDENT_AMBULATORY_CARE_PROVIDER_SITE_OTHER): Payer: No Typology Code available for payment source | Admitting: Family Medicine

## 2019-10-25 VITALS — BP 142/98 | Temp 98.2°F | Ht 72.0 in | Wt 169.0 lb

## 2019-10-25 DIAGNOSIS — Z125 Encounter for screening for malignant neoplasm of prostate: Secondary | ICD-10-CM

## 2019-10-25 DIAGNOSIS — E785 Hyperlipidemia, unspecified: Secondary | ICD-10-CM

## 2019-10-25 DIAGNOSIS — Z79891 Long term (current) use of opiate analgesic: Secondary | ICD-10-CM

## 2019-10-25 DIAGNOSIS — Z79899 Other long term (current) drug therapy: Secondary | ICD-10-CM | POA: Diagnosis not present

## 2019-10-25 MED ORDER — HYDROCODONE-ACETAMINOPHEN 7.5-325 MG PO TABS: ORAL_TABLET | ORAL | 0 refills | Status: DC

## 2019-10-25 NOTE — Progress Notes (Signed)
   Subjective:    Patient ID: Timothy Davis, male    DOB: 1960-08-02, 59 y.o.   MRN: [id number]  HPI This patient was seen today for chronic pain  The medication list was reviewed and updated.   -Compliance with medication: takes for back pain. Takes 4 per day  - Number patient states they take daily: 4 per day  -when was the last dose patient took? today  The patient was advised the importance of maintaining medication and not using illegal substances with these.  Here for refills and follow up  The patient was educated that we can provide 3 monthly scripts for their medication, it is their responsibility to follow the instructions.  Side effects or complications from medications: none  Patient is aware that pain medications are meant to minimize the severity of the pain to allow their pain levels to improve to allow for better function. They are aware of that pain medications cannot totally remove their pain.  Due for UDT ( at least once per year) : last one was 03/25/19  Patient compliant with insomnia medication. Generally takes most nights. No obvious morning drowsiness. Definitely helps patient sleep. Without it patient states would not get a good nights rest.      Review of Systems No headache no chest pain.  Chronic shortness of breath due to severe COPD followed by specialist    Objective:   Physical Exam  Alert vitals stable, NAD. Blood pressure good on repeat. HEENT normal. Lungs clear. Heart regular rate and rhythm. Diffuse low back pain to percussion negative straight leg raise today.      Assessment & Plan:  Impression severe degenerative lumbar disc disease/arthritis on chronic pain medication with substantial benefit.  Tolerates well  Impression: Chronic pain. Patient compliant with medication. No substantial side effects. [place] controlled substance registry reviewed to ensure compliance and proper use of medication. Patient aware goal of medicine  is not complete resolution of pain but to control his symptoms to improve his functional capacity. Aware of potential adverse side effects   2.  Insomnia.  Severe and profound.  Medications maintain  Recommend screening blood work.  Diet exercise discussed and encouraged.  Follow-up every 3 months

## 2019-10-27 ENCOUNTER — Other Ambulatory Visit: Payer: Self-pay | Admitting: Family Medicine

## 2019-10-28 LAB — HEPATIC FUNCTION PANEL
AG Ratio: 1.7 (calc) (ref 1.0–2.5)
ALT: 11 U/L (ref 9–46)
AST: 23 U/L (ref 10–35)
Albumin: 4.4 g/dL (ref 3.6–5.1)
Alkaline phosphatase (APISO): 41 U/L (ref 35–144)
Bilirubin, Direct: 0.1 mg/dL (ref 0.0–0.2)
Globulin: 2.6 g/dL (calc) (ref 1.9–3.7)
Indirect Bilirubin: 0.6 mg/dL (calc) (ref 0.2–1.2)
Total Bilirubin: 0.7 mg/dL (ref 0.2–1.2)
Total Protein: 7 g/dL (ref 6.1–8.1)

## 2019-10-28 LAB — PSA: PSA: 0.3 ng/mL (ref <=4.0)

## 2019-10-28 LAB — LIPID PANEL
Cholesterol: 199 mg/dL (ref <200)
HDL: 55 mg/dL (ref >=40)
LDL Cholesterol (Calc): 124 mg/dL (calc) — ABNORMAL HIGH
Non-HDL Cholesterol (Calc): 144 mg/dL (calc) — ABNORMAL HIGH (ref <130)
Total CHOL/HDL Ratio: 3.6 (calc) (ref <5.0)
Triglycerides: 98 mg/dL (ref <150)

## 2019-10-28 LAB — BASIC METABOLIC PANEL WITH GFR
BUN: 14 mg/dL (ref 7–25)
CO2: 31 mmol/L (ref 20–32)
Calcium: 9.3 mg/dL (ref 8.6–10.3)
Chloride: 105 mmol/L (ref 98–110)
Creat: 0.87 mg/dL (ref 0.70–1.33)
GFR, Est African American: 110 mL/min/{1.73_m2} (ref >=60)
GFR, Est Non African American: 95 mL/min/{1.73_m2} (ref >=60)
Glucose, Bld: 111 mg/dL — ABNORMAL HIGH (ref 65–99)
Potassium: 4.3 mmol/L (ref 3.5–5.3)
Sodium: 140 mmol/L (ref 135–146)

## 2019-10-31 ENCOUNTER — Encounter: Payer: Self-pay | Admitting: Family Medicine

## 2019-11-08 ENCOUNTER — Other Ambulatory Visit (HOSPITAL_COMMUNITY)
Admission: RE | Admit: 2019-11-08 | Discharge: 2019-11-08 | Disposition: A | Discharge status: Home / Self Care | Payer: No Typology Code available for payment source | Source: Ambulatory Visit | Attending: Pulmonary Disease | Admitting: Pulmonary Disease

## 2019-11-08 ENCOUNTER — Other Ambulatory Visit: Payer: Self-pay

## 2019-11-08 DIAGNOSIS — Z20822 Contact with and (suspected) exposure to covid-19: Secondary | ICD-10-CM | POA: Diagnosis not present

## 2019-11-08 DIAGNOSIS — Z01812 Encounter for preprocedural laboratory examination: Secondary | ICD-10-CM | POA: Diagnosis present

## 2019-11-09 LAB — SARS CORONAVIRUS 2 (TAT 6-24 HRS): SARS Coronavirus 2: NEGATIVE

## 2019-11-11 ENCOUNTER — Other Ambulatory Visit: Payer: Self-pay

## 2019-11-11 ENCOUNTER — Ambulatory Visit (INDEPENDENT_AMBULATORY_CARE_PROVIDER_SITE_OTHER): Payer: No Typology Code available for payment source | Admitting: Pulmonary Disease

## 2019-11-11 ENCOUNTER — Telehealth: Payer: Self-pay | Admitting: Pulmonary Disease

## 2019-11-11 DIAGNOSIS — J449 Chronic obstructive pulmonary disease, unspecified: Secondary | ICD-10-CM

## 2019-11-11 LAB — PULMONARY FUNCTION TEST
DL/VA % pred: 70 %
DL/VA: 2.97 ml/min/mmHg/L
DLCO unc % pred: 64 %
DLCO unc: 19.01 ml/min/mmHg
FEF 25-75 Pre: 0.31 L/sec
FEF2575-%Pred-Pre: 9 %
FEV1-%Pred-Pre: 20 %
FEV1-Pre: 0.81 L
FEV1FVC-%Pred-Pre: 44 %
FEV6-%Pred-Pre: 43 %
FEV6-Pre: 2.12 L
FEV6FVC-%Pred-Pre: 92 %
FVC-%Pred-Pre: 46 %
FVC-Pre: 2.39 L
Pre FEV1/FVC ratio: 34 %
Pre FEV6/FVC Ratio: 89 %

## 2019-11-11 NOTE — Progress Notes (Signed)
Test aborted after Spirometry and 1 DLCO due to patient losing consciousness.

## 2019-11-11 NOTE — Telephone Encounter (Signed)
While having PFTs performed  Expiratory phase was very prolonged  Appears to have become transiently altered-his mental status, shaky Came to almost immediately  When I saw him probably about a minute later  Appropriate in conversation States he feels well Felt well this morning with no symptoms Found to test very challenging  Moving all extremities Reduced air movement bilaterally S1-S2 appreciated  120/80, 66/min Was able to sit and stand twice without feeling light headed  Spouse came in to pick him up   Encouraged to take it easy today, no heavy manual work  Go to the emergency department if any new symptoms    Cancel PFT

## 2019-11-12 ENCOUNTER — Other Ambulatory Visit: Payer: Self-pay | Admitting: Family Medicine

## 2020-01-13 ENCOUNTER — Other Ambulatory Visit: Payer: Self-pay | Admitting: Family Medicine

## 2020-01-25 ENCOUNTER — Other Ambulatory Visit: Payer: Self-pay

## 2020-01-25 ENCOUNTER — Encounter: Payer: Self-pay | Admitting: Family Medicine

## 2020-01-25 ENCOUNTER — Ambulatory Visit (INDEPENDENT_AMBULATORY_CARE_PROVIDER_SITE_OTHER): Payer: No Typology Code available for payment source | Admitting: Family Medicine

## 2020-01-25 ENCOUNTER — Telehealth: Payer: Self-pay | Admitting: Family Medicine

## 2020-01-25 VITALS — BP 130/78 | HR 61 | Temp 97.2°F | Ht 72.0 in | Wt 167.4 lb

## 2020-01-25 DIAGNOSIS — M5440 Lumbago with sciatica, unspecified side: Secondary | ICD-10-CM

## 2020-01-25 DIAGNOSIS — G8929 Other chronic pain: Secondary | ICD-10-CM

## 2020-01-25 DIAGNOSIS — F5101 Primary insomnia: Secondary | ICD-10-CM

## 2020-01-25 MED ORDER — TRAZODONE HCL 100 MG PO TABS: ORAL_TABLET | ORAL | 0 refills | Status: DC

## 2020-01-25 MED ORDER — HYDROCODONE-ACETAMINOPHEN 7.5-325 MG PO TABS: ORAL_TABLET | ORAL | 0 refills | Status: DC

## 2020-01-25 NOTE — Progress Notes (Signed)
Patient ID: Timothy Davis, male    DOB: Feb 18, 1961, 59 y.o.   MRN: [id number]   Chief Complaint  Patient presents with  . Pain Management    Pt here for 3 month follow up on pain med. pt is taking Hydrocode 7.5-325 mg. pt takes 4 tablets daily, with last dose being this morning. pt having no side effects or complications. pt also taking trazodone 100 mg at bedtime.    Subjective:    HPI Pt has chronic low back pain.  Has been going on for over 10-15 yrs.  Has had imaging in past, however unable to find any in the chart.  Has seen specialist in past, unable to find any notes.   Has not had any falls or surgery on back. No numbness or tingling in legs.  No saddle anesthesia or bowel/bladder incontinence. Pt can't recall when his imaging was, or who he has seen in past for chronic back pian.  Pt stating he has seen pain management in past, but can't recall name of providers.  Pt taking 7.5mg  q 4hrs for pain, taking 4 tab per day.  H/o copd- former smoker.  Taking inhalers.  No issues with breathing.  Medical History Timothy Davis has a past medical history of Asthmatic bronchitis, Chronic back pain, COPD (chronic obstructive pulmonary disease) ([place]), Hepatitis C antibody test positive, Insomnia, and Shortness of breath.   Outpatient Encounter Medications as of 01/25/2020  Medication Sig  . albuterol (PROVENTIL) (2.5 MG/3ML) 0.083% nebulizer solution Take 3 mLs (2.5 mg total) by nebulization every 6 (six) hours as needed for wheezing or shortness of breath.  Marland Kitchen albuterol (VENTOLIN HFA) 108 (90 Base) MCG/ACT inhaler INHALE TWO PUFFS INTO THE LUNGS EVERY SIX HOURS AS NEEDED  . budesonide (PULMICORT) 0.25 MG/2ML nebulizer solution Take 2 mLs (0.25 mg total) by nebulization 2 (two) times daily.  Marland Kitchen HYDROcodone-acetaminophen (NORCO) 7.5-325 MG tablet Take 1 tablet by mouth bid prn pain.  . traZODone (DESYREL) 100 MG tablet TAKE ONE TABLET BY MOUTH EVERY NIGHT AT BEDTIME  . umeclidinium-vilanterol (ANORO  ELLIPTA) 62.5-25 MCG/INH AEPB Inhale 1 puff into the lungs daily.  . [DISCONTINUED] HYDROcodone-acetaminophen (NORCO) 7.5-325 MG tablet Take 1 tablet by mouth 4 (four) times daily as needed  . [DISCONTINUED] HYDROcodone-acetaminophen (NORCO) 7.5-325 MG tablet TAKE ONE (1) TABLET BY MOUTH FOUR (4) TIMES DAILY AS NEEDED  . [DISCONTINUED] HYDROcodone-acetaminophen (NORCO) 7.5-325 MG tablet Take 1 tablet by mouth 4 (four) times daily as needed  . [DISCONTINUED] traZODone (DESYREL) 100 MG tablet TAKE ONE TABLET BY MOUTH EVERY NIGHT AT BEDTIME   No facility-administered encounter medications on file as of 01/25/2020.     Review of Systems  Constitutional: Negative for chills and fever.  HENT: Negative for congestion, rhinorrhea and sore throat.   Respiratory: Negative for cough, shortness of breath and wheezing.   Cardiovascular: Negative for chest pain and leg swelling.  Gastrointestinal: Negative for abdominal pain, diarrhea, nausea and vomiting.  Genitourinary: Negative for dysuria, frequency and hematuria.  Musculoskeletal: Positive for back pain (chronic).  Skin: Negative for rash.  Neurological: Negative for dizziness, weakness, numbness and headaches.  Psychiatric/Behavioral: Positive for sleep disturbance.     Vitals BP 130/78   Pulse 61   Temp (!) 97.2 F (36.2 C)   Ht 6' (1.829 m)   Wt 167 lb 6.4 oz (75.9 kg)   SpO2 99%   BMI 22.70 kg/m   Objective:   Physical Exam Vitals and nursing note reviewed.  Constitutional:  General: He is not in acute distress.    Appearance: Normal appearance. He is not ill-appearing.  HENT:     Head: Normocephalic.     Nose: Nose normal. No congestion.     Mouth/Throat:     Mouth: Mucous membranes are moist.     Pharynx: No oropharyngeal exudate.  Eyes:     Extraocular Movements: Extraocular movements intact.     Conjunctiva/sclera: Conjunctivae normal.     Pupils: Pupils are equal, round, and reactive to light.  Cardiovascular:       Rate and Rhythm: Normal rate and regular rhythm.     Pulses: Normal pulses.     Heart sounds: Normal heart sounds. No murmur heard.   Pulmonary:     Effort: Pulmonary effort is normal.     Breath sounds: Normal breath sounds. No wheezing, rhonchi or rales.  Musculoskeletal:        General: Normal range of motion.     Right lower leg: No edema.     Left lower leg: No edema.     Comments: Normal skin inspection of back.  No spinous process tenderness in T or L spine. Neg- SLR bilaterally.  Normal sensation in LEs.  Normal gait. Normal muscle strength bilateral LEs.  Skin:    General: Skin is warm and dry.     Findings: No rash.  Neurological:     General: No focal deficit present.     Mental Status: He is alert and oriented to person, place, and time.     Cranial Nerves: No cranial nerve deficit.     Motor: No weakness.     Gait: Gait normal.  Psychiatric:        Mood and Affect: Mood normal.        Behavior: Behavior normal.        Thought Content: Thought content normal.        Judgment: Judgment normal.      Assessment and Plan   1. Chronic midline low back pain with sciatica, sciatica laterality unspecified - HYDROcodone-acetaminophen (NORCO) 7.5-325 MG tablet; Take 1 tablet by mouth bid prn pain.  Dispense: 60 tablet; Refill: 0  2. Primary insomnia - traZODone (DESYREL) 100 MG tablet; TAKE ONE TABLET BY MOUTH EVERY NIGHT AT BEDTIME  Dispense: 30 tablet; Refill: 0   Long discussion about chronic pain medications and need to decrease or taper off some of the medication. Pt not wanting to taper down on pain medications or try another medication for pain, NSAIDs for the chronic back pain.  Reviewed the safety concerns of being on these doses for long periods of time.  No imaging in the chart or old notes from previous specialist about his chronic back pain. Pt declining pain management referral.  Pt given norco 7.5mg - 1 tab p.o. bid prn pain 60 tab, for 30  days. Refill of trazodone given.  F/u prn.

## 2020-01-25 NOTE — Telephone Encounter (Signed)
Patient wife Vivien Rota) wanting to know why patient medication what cut back . She is on his [phone]

## 2020-01-26 NOTE — Telephone Encounter (Signed)
Also pt didn't have any neuro deficits and overall normal exam.  I don't feel that it's appropriate to continue these high doses of pain medication for this concern and would like pt to have a look at other options for pain or decreasing the pain medications with a specialist.

## 2020-01-26 NOTE — Telephone Encounter (Signed)
Long discussion with pt about high dose of pain medication, that I don't usually prescribe for long periods of time.  No imaging in chart since 2012 (that's how far back our charts go) or specialist notes on the low back pain.  Would recommend referral to ortho or pain management.  Pt not wanting to taper off the medication or try other medications for the pain.    Sent pt pain management referral, but let us know if he is interested in getting another medication for his pain or seeing a specialist.   Thx.   Dr. Lovena Le

## 2020-01-26 NOTE — Telephone Encounter (Signed)
Discussed with wife(DPR) Wife stated she will discuss it further with her husband and will call back if they would like to try the referrals or alternative pain management.

## 2020-02-08 ENCOUNTER — Telehealth: Payer: Self-pay | Admitting: Family Medicine

## 2020-02-08 NOTE — Telephone Encounter (Signed)
error 

## 2020-03-10 ENCOUNTER — Telehealth: Payer: Self-pay | Admitting: Pulmonary Disease

## 2020-03-10 ENCOUNTER — Telehealth: Payer: Self-pay | Admitting: Family Medicine

## 2020-03-10 MED ORDER — ANORO ELLIPTA 62.5-25 MCG/INH IN AEPB: 1.0000 | INHALATION_SPRAY | Freq: Every day | RESPIRATORY_TRACT | 2 refills | Status: DC

## 2020-03-10 MED ORDER — ALBUTEROL SULFATE HFA 108 (90 BASE) MCG/ACT IN AERS: INHALATION_SPRAY | RESPIRATORY_TRACT | 1 refills | Status: DC

## 2020-03-10 NOTE — Telephone Encounter (Signed)
Per Dr Lovena Le: Is patient wanting to proceed with pain management referral or does he want to go with taper

## 2020-03-10 NOTE — Telephone Encounter (Signed)
Left message to return call 

## 2020-03-10 NOTE — Telephone Encounter (Signed)
Spoke wth the pt's spouse to confirm meds and pharmacy- Anoro and Albuterol inhalers sent to [facility]. I advised her to please advise the pt to keep his pending appt in Oct with Aaron Edelman for future rx.

## 2020-03-10 NOTE — Telephone Encounter (Signed)
Patient is requesting refill on hydrocodone 7.5-3.25 and a prescription for ambien called into [facility]

## 2020-03-13 NOTE — Telephone Encounter (Signed)
Pt called back and states he does not know what he wants to do. He will talk with his wife and call us back.

## 2020-03-14 ENCOUNTER — Telehealth: Payer: Self-pay | Admitting: Family Medicine

## 2020-03-14 DIAGNOSIS — G8929 Other chronic pain: Secondary | ICD-10-CM

## 2020-03-14 DIAGNOSIS — M5440 Lumbago with sciatica, unspecified side: Secondary | ICD-10-CM

## 2020-03-14 MED ORDER — HYDROCODONE-ACETAMINOPHEN 7.5-325 MG PO TABS: ORAL_TABLET | ORAL | 0 refills | Status: DC

## 2020-03-14 NOTE — Telephone Encounter (Signed)
Patient's wife is on DPR for office -signed 12/2019- Wife states the patient is confused and she wants to know why he is unable to get refills on his pain meds. Please advise

## 2020-03-14 NOTE — Telephone Encounter (Signed)
Long discussion on last visit about this and need for pt to f/u with pain management that I don't prescribe these medications chronically and long term. And advised pt if he returned to me for more meds then would be put on a tapering dose.   Pls put in referral for pt.   Thx,   Dr. Lovena Le

## 2020-03-14 NOTE — Telephone Encounter (Signed)
Sent pain meds in.  Thx. Dr. Lovena Le

## 2020-03-14 NOTE — Telephone Encounter (Signed)
Discussed with pt's wife ( who is on DPR for all chmg) and she states he wants to go to pain management because taking 2 a day is not helping. He is in a lot of pain in the evenings. She would like to stay in [place] if possible. Also asked about what to do about refill when he is due around the 24th.

## 2020-03-14 NOTE — Telephone Encounter (Signed)
Wife-Tonia called wanting to know why patient cant get refills on his pain medication. Patient's wife not on DPR for our office

## 2020-03-14 NOTE — Telephone Encounter (Signed)
Patient would like referral to pain management- patient states he is due to refill his pain med 03/21/20 and will need medication refill till he can get in with pain management.  Referral ordered in [facility]

## 2020-03-14 NOTE — Telephone Encounter (Signed)
See message from today 03/14/20

## 2020-03-15 NOTE — Telephone Encounter (Signed)
Wife(DPR) notified 

## 2020-03-20 ENCOUNTER — Other Ambulatory Visit: Payer: Self-pay | Admitting: Family Medicine

## 2020-03-20 DIAGNOSIS — F5101 Primary insomnia: Secondary | ICD-10-CM

## 2020-03-21 ENCOUNTER — Telehealth: Payer: Self-pay | Admitting: Family Medicine

## 2020-03-21 DIAGNOSIS — F5101 Primary insomnia: Secondary | ICD-10-CM

## 2020-03-21 MED ORDER — TRAZODONE HCL 100 MG PO TABS: ORAL_TABLET | ORAL | 1 refills | Status: DC

## 2020-03-21 NOTE — Telephone Encounter (Signed)
[place] wife called in pt needs refill on traZODone (DESYREL) 100 MG tablet send to [facility], [place]    Pt call back [phone]

## 2020-03-22 ENCOUNTER — Encounter: Payer: Self-pay | Admitting: Family Medicine

## 2020-04-18 ENCOUNTER — Other Ambulatory Visit (HOSPITAL_COMMUNITY): Payer: Self-pay | Admitting: Neurology

## 2020-04-18 DIAGNOSIS — F5104 Psychophysiologic insomnia: Secondary | ICD-10-CM | POA: Diagnosis not present

## 2020-04-18 DIAGNOSIS — M545 Low back pain, unspecified: Secondary | ICD-10-CM

## 2020-04-18 DIAGNOSIS — J449 Chronic obstructive pulmonary disease, unspecified: Secondary | ICD-10-CM | POA: Diagnosis not present

## 2020-04-18 DIAGNOSIS — M19012 Primary osteoarthritis, left shoulder: Secondary | ICD-10-CM | POA: Diagnosis not present

## 2020-04-26 ENCOUNTER — Ambulatory Visit (INDEPENDENT_AMBULATORY_CARE_PROVIDER_SITE_OTHER): Payer: No Typology Code available for payment source | Admitting: Family Medicine

## 2020-04-26 ENCOUNTER — Encounter: Payer: Self-pay | Admitting: Family Medicine

## 2020-04-26 ENCOUNTER — Other Ambulatory Visit: Payer: Self-pay

## 2020-04-26 VITALS — BP 118/88 | HR 63 | Temp 97.8°F | Ht 72.0 in | Wt 164.0 lb

## 2020-04-26 DIAGNOSIS — K7460 Unspecified cirrhosis of liver: Secondary | ICD-10-CM

## 2020-04-26 DIAGNOSIS — M5440 Lumbago with sciatica, unspecified side: Secondary | ICD-10-CM

## 2020-04-26 DIAGNOSIS — J449 Chronic obstructive pulmonary disease, unspecified: Secondary | ICD-10-CM

## 2020-04-26 DIAGNOSIS — G8929 Other chronic pain: Secondary | ICD-10-CM | POA: Diagnosis not present

## 2020-04-26 DIAGNOSIS — R7301 Impaired fasting glucose: Secondary | ICD-10-CM

## 2020-04-26 DIAGNOSIS — Z23 Encounter for immunization: Secondary | ICD-10-CM | POA: Diagnosis not present

## 2020-04-26 DIAGNOSIS — E785 Hyperlipidemia, unspecified: Secondary | ICD-10-CM | POA: Diagnosis not present

## 2020-04-26 LAB — POCT GLYCOSYLATED HEMOGLOBIN (HGB A1C): Hemoglobin A1C: 5.5 % (ref 4.0–5.6)

## 2020-04-26 NOTE — Progress Notes (Signed)
Patient ID: Timothy Davis, male    DOB: 03/24/1961, 59 y.o.   MRN: [id number]   Chief Complaint  Patient presents with  . med check up  . Insomnia   Subjective:    HPI  F/u insomnia.  H/o chronic back pain.  We dec pt dose from 4 tab of norco 7.5 daily to 2 tab per day.  Pt referred to chronic pain management. Pt is seeing a pain management Dr. Merlene Laughter with pain/neuro.  Pt states no concerns today.   Taking trazodone for sleep, and doing well.  No concerns per pt.  Feeling eat before going to bed to take the medication.  Pt seeing Dr. Merlene Laughter for the chronic back pain- started on cymbalta 31m. And was supposed to take 2 per day. Didn't feel good on 2 only 1 per day. Taking norco 2x per, taking 2-3 per day from dr. DSampson Goon  H/o hld- stable.  Elevated glucose in past- not aware of this per pt.  H/o copd- doing well, seeing pulm in [place].  Not needing refills.    Medical History [place] a past medical history of Asthmatic bronchitis, Chronic back pain, [place], Hepatitis C antibody test positive, Insomnia, and Shortness of breath.   Outpatient Encounter Medications as of 04/26/2020  Medication Sig  . albuterol (VENTOLIN HFA) 108 (90 Base) MCG/ACT inhaler INHALE TWO PUFFS INTO THE LUNGS EVERY SIX HOURS AS NEEDED  . DULoxetine (CYMBALTA) 30 MG capsule Take 60 mg by mouth daily.  .Marland KitchenHYDROcodone-acetaminophen (NORCO) 7.5-325 MG tablet Take 1 tablet by mouth bid prn pain.  . traZODone (DESYREL) 100 MG tablet TAKE ONE TABLET BY MOUTH EVERY NIGHT AT BEDTIME  . umeclidinium-vilanterol (ANORO ELLIPTA) 62.5-25 MCG/INH AEPB Inhale 1 puff into the lungs daily.  .Marland Kitchenalbuterol (PROVENTIL) (2.5 MG/3ML) 0.083% nebulizer solution Take 3 mLs (2.5 mg total) by nebulization every 6 (six) hours as needed for wheezing or shortness of breath. (Patient not taking: Reported on 04/26/2020)  . budesonide (PULMICORT) 0.25 MG/2ML nebulizer solution Take 2  mLs (0.25 mg total) by nebulization 2 (two) times daily. (Patient not taking: Reported on 04/26/2020)   No facility-administered encounter medications on file as of 04/26/2020.     Review of Systems  Constitutional: Negative for chills and fever.  HENT: Negative for congestion, rhinorrhea and sore throat.   Respiratory: Negative for cough, shortness of breath and wheezing.   Cardiovascular: Negative for chest pain and leg swelling.  Gastrointestinal: Negative for abdominal pain, diarrhea, nausea and vomiting.  Genitourinary: Negative for dysuria and frequency.  Musculoskeletal: Positive for back pain.       +chronic back pain -controlled.  Skin: Negative for rash.  Neurological: Negative for dizziness, weakness and headaches.     Vitals BP 118/88   Pulse 63   Temp 97.8 F (36.6 C)   Ht 6' (1.829 m)   Wt 164 lb (74.4 kg)   SpO2 96%   BMI 22.24 kg/m   Objective:   Physical Exam Vitals and nursing note reviewed.  Constitutional:      General: He is not in acute distress.    Appearance: Normal appearance. He is not ill-appearing.  HENT:     Head: Normocephalic.     Nose: Nose normal. No congestion.     Mouth/Throat:     Mouth: Mucous membranes are moist.     Pharynx: No oropharyngeal exudate.  Eyes:     Extraocular Movements: Extraocular movements intact.  Conjunctiva/sclera: Conjunctivae normal.     Pupils: Pupils are equal, round, and reactive to light.  Cardiovascular:     Rate and Rhythm: Normal rate and regular rhythm.     Pulses: Normal pulses.     Heart sounds: Normal heart sounds. No murmur heard.   Pulmonary:     Effort: Pulmonary effort is normal.     Breath sounds: Normal breath sounds. No wheezing, rhonchi or rales.  Musculoskeletal:        General: Normal range of motion.     Right lower leg: No edema.     Left lower leg: No edema.  Skin:    General: Skin is warm and dry.     Findings: No rash.  Neurological:     General: No focal deficit  present.     Mental Status: He is alert and oriented to person, place, and time.     Cranial Nerves: No cranial nerve deficit.  Psychiatric:        Mood and Affect: Mood normal.        Behavior: Behavior normal.        Thought Content: Thought content normal.        Judgment: Judgment normal.      Assessment and Plan   1. Impaired fasting blood sugar - POCT HgB A1C - CMP14+EGFR; Future - CMP14+EGFR  2. Hepatic cirrhosis, unspecified hepatic cirrhosis type, unspecified whether ascites present (HCC) - CBC; Future - CMP14+EGFR; Future - Lipid panel; Future - Lipid panel - CMP14+EGFR - CBC  3. Hyperlipidemia, unspecified hyperlipidemia type - Lipid panel; Future - Lipid panel  4. COPD, very severe ([place])  5. Chronic midline low back pain with sciatica, sciatica laterality unspecified  6. Need for vaccination - Flu Vaccine QUAD 6+ mos PF IM (Fluarix Quad PF)   Chronic pain syndrome, chronic back pain-stable, controlled.  Seeing pain management. Pt only taking 1 tab per day of cymbalta, feeling bad when trying to take 2 tab per day. Taking norco as directed by Dr. Merlene Laughter. Taking 3 tab per day of norco 7.66m.  -per neuro note, they ordered lumbar xray and pt to f/u with them in 4 wks.  Copd/emphysema-stable, controlled. Seeing pulmonologist for refill on inhalers.  Stable.  Flu vaccine due, will give today.  Impaired glucose on last labs, will check a1c today. 5.3 today in office.  Will cont to monitor and have pt dec carbs.  Labs ordered to review on next visit.  F/u 618mor prn

## 2020-04-27 ENCOUNTER — Other Ambulatory Visit: Payer: Self-pay | Admitting: Family Medicine

## 2020-04-28 LAB — COMPLETE METABOLIC PANEL WITH GFR
AG Ratio: 1.9 (calc) (ref 1.0–2.5)
ALT: 14 U/L (ref 9–46)
AST: 22 U/L (ref 10–35)
Albumin: 4.6 g/dL (ref 3.6–5.1)
Alkaline phosphatase (APISO): 39 U/L (ref 35–144)
BUN: 13 mg/dL (ref 7–25)
CO2: 33 mmol/L — ABNORMAL HIGH (ref 20–32)
Calcium: 9.9 mg/dL (ref 8.6–10.3)
Chloride: 100 mmol/L (ref 98–110)
Creat: 0.88 mg/dL (ref 0.70–1.33)
GFR, Est African American: 109 mL/min/{1.73_m2} (ref >=60)
GFR, Est Non African American: 94 mL/min/{1.73_m2} (ref >=60)
Globulin: 2.4 g/dL (calc) (ref 1.9–3.7)
Glucose, Bld: 103 mg/dL — ABNORMAL HIGH (ref 65–99)
Potassium: 5.1 mmol/L (ref 3.5–5.3)
Sodium: 140 mmol/L (ref 135–146)
Total Bilirubin: 0.7 mg/dL (ref 0.2–1.2)
Total Protein: 7 g/dL (ref 6.1–8.1)

## 2020-04-28 LAB — CBC
HCT: 45 % (ref 38.5–50.0)
Hemoglobin: 15.2 g/dL (ref 13.2–17.1)
MCH: 31.3 pg (ref 27.0–33.0)
MCHC: 33.8 g/dL (ref 32.0–36.0)
MCV: 92.8 fL (ref 80.0–100.0)
MPV: 10.1 fL (ref 7.5–12.5)
Platelets: 203 10*3/uL (ref 140–400)
RBC: 4.85 10*6/uL (ref 4.20–5.80)
RDW: 12.5 % (ref 11.0–15.0)
WBC: 3.6 10*3/uL — ABNORMAL LOW (ref 3.8–10.8)

## 2020-04-28 LAB — LIPID PANEL
Cholesterol: 195 mg/dL (ref <200)
HDL: 51 mg/dL (ref >=40)
LDL Cholesterol (Calc): 126 mg/dL (calc) — ABNORMAL HIGH
Non-HDL Cholesterol (Calc): 144 mg/dL (calc) — ABNORMAL HIGH (ref <130)
Total CHOL/HDL Ratio: 3.8 (calc) (ref <5.0)
Triglycerides: 81 mg/dL (ref <150)

## 2020-05-09 ENCOUNTER — Encounter: Payer: Self-pay | Admitting: Pulmonary Disease

## 2020-05-09 ENCOUNTER — Ambulatory Visit (INDEPENDENT_AMBULATORY_CARE_PROVIDER_SITE_OTHER): Payer: No Typology Code available for payment source | Admitting: Pulmonary Disease

## 2020-05-09 ENCOUNTER — Telehealth: Payer: Self-pay | Admitting: Pulmonary Disease

## 2020-05-09 ENCOUNTER — Other Ambulatory Visit: Payer: Self-pay

## 2020-05-09 VITALS — BP 130/76 | HR 70 | Temp 96.4°F | Ht 72.0 in | Wt 163.0 lb

## 2020-05-09 DIAGNOSIS — Z23 Encounter for immunization: Secondary | ICD-10-CM

## 2020-05-09 DIAGNOSIS — J849 Interstitial pulmonary disease, unspecified: Secondary | ICD-10-CM

## 2020-05-09 DIAGNOSIS — Z0000 Encounter for general adult medical examination without abnormal findings: Secondary | ICD-10-CM | POA: Insufficient documentation

## 2020-05-09 DIAGNOSIS — Z720 Tobacco use: Secondary | ICD-10-CM | POA: Diagnosis not present

## 2020-05-09 DIAGNOSIS — J449 Chronic obstructive pulmonary disease, unspecified: Secondary | ICD-10-CM | POA: Diagnosis not present

## 2020-05-09 NOTE — Patient Instructions (Addendum)
You were seen today by Lauraine Rinne, NP  for:   1. 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 ILD (interstitial lung disease) ([place])  Walk today in office  Anoro Ellipta  >>> Take 1 puff daily in the morning right when you wake up >>>Rinse your mouth out after use >>>This is a daily maintenance inhaler, NOT a rescue inhaler >>>Contact our office if you are having difficulties affording or obtaining this medication >>>It is important for you to be able to take this daily and not miss any doses   Resume Pulmicort nebulized medications twice daily   Note your daily symptoms > remember "red flags" for COPD:   >>>Increase in cough >>>increase in sputum production >>>increase in shortness of breath or activity  intolerance.   If you notice these symptoms, please call the office to be seen.   Let us know if you would like a referral to pulmonary rehab at [facility]   3. Smokeless tobacco use  We recommend that you stop using smokeless tobacco  >>>You need to set a quit date >>>If you have friends or family who smoke, let them know you are trying to quit and not to smoke around you or in your living environment  Smoking Cessation Resources:  1 800 QUIT NOW  >>> Patient to call this resource and utilize it to help support you quit using tobacco  >>> Keep up your hard work with stopping tobacco use  You can also contact the [facility] >>>For smoking cessation classes call [phone]  We do not recommend using e-cigarettes as a form of stopping smoking  You can sign up for smoking cessation support texts and information:  >>>https://smokefree.gov/smokefreetxt  4. Healthcare maintenance  Pneumovax23 today    Follow Up:    Return in about 6 weeks (around 06/20/2020), or if symptoms worsen or fail to improve, for [facility], NEW PULMONOLOGIST IN 13min SLOT.   Notification of test results are managed in the following manner: If there are  any recommendations or changes  to the  plan of care discussed in office today,  we will contact you and let you know what they are. If you do not hear from [place], then your results are normal and you can view them through your  MyChart account , or a letter will be sent to you. Thank you again for trusting [place] with your care  - Thank you, [facility]    It is flu season:   >>> Best ways to protect herself from the flu: Receive the yearly flu vaccine, practice good hand hygiene washing with soap and also using hand sanitizer when available, eat a nutritious meals, get adequate rest, hydrate appropriately       Please contact the office if your symptoms worsen or you have concerns that you are not improving.   Thank you for choosing [facility] for your healthcare, and for allowing [place] to partner with you on your healthcare journey. I am thankful to be able to provide care to you today.   Wyn Quaker FNP-C   Pneumococcal Vaccine, Polyvalent solution for injection What is this medicine? PNEUMOCOCCAL VACCINE, POLYVALENT (NEU mo KOK al vak SEEN, pol ee VEY luhnt) is a vaccine to prevent pneumococcus bacteria infection. These bacteria are a major cause of ear infections, Strep throat infections, and serious pneumonia, meningitis, or blood infections worldwide. These vaccines help the body to produce antibodies (protective substances) that help your body defend against these bacteria.  This vaccine is recommended for people 37 years of age and older with health problems. It is also recommended for all adults over 37 years old. This vaccine will not treat an infection. This medicine may be used for other purposes; ask your health care provider or pharmacist if you have questions. COMMON BRAND NAME(S): Pneumovax 23 What should I tell my health care provider before I take this medicine? They need to know if you have any of these conditions:  bleeding problems  bone marrow or organ transplant  cancer, Hodgkin's  disease  fever  infection  immune system problems  low platelet count in the blood  seizures  an unusual or allergic reaction to pneumococcal vaccine, diphtheria toxoid, other vaccines, latex, other medicines, foods, dyes, or preservatives  pregnant or trying to get pregnant  breast-feeding How should I use this medicine? This vaccine is for injection into a muscle or under the skin. It is given by a health care professional. A copy of Vaccine Information Statements will be given before each vaccination. Read this sheet carefully each time. The sheet may change frequently. Talk to your pediatrician regarding the use of this medicine in children. While this drug may be prescribed for children as young as 41 years of age for selected conditions, precautions do apply. Overdosage: If you think you have taken too much of this medicine contact a poison control center or emergency room at once. NOTE: This medicine is only for you. Do not share this medicine with others. What if I miss a dose? It is important not to miss your dose. Call your doctor or health care professional if you are unable to keep an appointment. What may interact with this medicine?  medicines for cancer chemotherapy  medicines that suppress your immune function  medicines that treat or prevent blood clots like warfarin, enoxaparin, and dalteparin  steroid medicines like prednisone or cortisone This list may not describe all possible interactions. Give your health care provider a list of all the medicines, herbs, non-prescription drugs, or dietary supplements you use. Also tell them if you smoke, drink alcohol, or use illegal drugs. Some items may interact with your medicine. What should I watch for while using this medicine? Mild fever and pain should go away in 3 days or less. Report any unusual symptoms to your doctor or health care professional. What side effects may I notice from receiving this medicine? Side  effects that you should report to your doctor or health care professional as soon as possible:  allergic reactions like skin rash, itching or hives, swelling of the face, lips, or tongue  breathing problems  confused  fever over 102 degrees F  pain, tingling, numbness in the hands or feet  seizures  unusual bleeding or bruising  unusual muscle weakness Side effects that usually do not require medical attention (report to your doctor or health care professional if they continue or are bothersome):  aches and pains  diarrhea  fever of 102 degrees F or less  headache  irritable  loss of appetite  pain, tender at site where injected  trouble sleeping This list may not describe all possible side effects. Call your doctor for medical advice about side effects. You may report side effects to FDA at 1-800-FDA-1088. Where should I keep my medicine? This does not apply. This vaccine is given in a clinic, pharmacy, doctor's office, or other health care setting and will not be stored at home. NOTE: This sheet is a summary. It  may not cover all possible information. If you have questions about this medicine, talk to your doctor, pharmacist, or health care provider.  2020 Elsevier/Gold Standard (2008-02-19 14:32:37)

## 2020-05-09 NOTE — Telephone Encounter (Signed)
Brovana- $300 copay for quantity 140ml for 30 day supply. Did not see where a manuafacturer copay card was available  Perforomist- $200 copay or quantity 117ml for 30 day supply. $0 manufacturer copay card is available online.

## 2020-05-09 NOTE — Telephone Encounter (Signed)
Brovana / Performist costs?

## 2020-05-09 NOTE — Telephone Encounter (Signed)
Thank you   Timothy Davis  

## 2020-05-09 NOTE — Assessment & Plan Note (Signed)
Plan: Emphasized need to stop smokeless tobacco use

## 2020-05-09 NOTE — Assessment & Plan Note (Signed)
Reviewed 2019 lab work that shows peripheral eosinophilia Reviewed 2018 pulmonary function test that shows very severe COPD stage IV Reviewed high-resolution CT chest that shows severe emphysema Walk today in office does not show any oxygen desaturations with physical activity  Plan: Continue Anoro Ellipta Resume Pulmicort nebs twice daily Walk today in office Establish with Dr. Melvyn Novas at [facility] clinic in 6 to 8 weeks and 30-minute time slot Pneumovax 23 today Offered pulmonary rehab referral, patient will think about it and let us know

## 2020-05-09 NOTE — Telephone Encounter (Signed)
Ran test claim for Yupelri, Ran quanitity of 170ml for 30 days- patient's copay was zero due to manufacturer copay card applying to claim.  Deirdre Evener is non-formulary. Maretta Bees is preferred.

## 2020-05-09 NOTE — Assessment & Plan Note (Signed)
Up-to-date with flu vaccine Up-to-date with COVID-19 vaccinations  Plan: Patient to send in picture of COVID-19 vaccination cards we can update records Pneumovax 23 today

## 2020-05-09 NOTE — Telephone Encounter (Signed)
05/09/2020  Pharmacy team:  Can we investigate what the out-of-pocket cost would be for the patient if he proceeded forward with nebulized LAMA agents as well as a nebulized LAMA agent such as Yupelri.  Can we get estimation of benefits regarding this?  Wyn Quaker, FNP

## 2020-05-09 NOTE — Progress Notes (Signed)
@Patient  ID: Timothy Davis, male    DOB: August 24, 1960, 59 y.o.   MRN: [id number]  Chief Complaint  Patient presents with  . Follow-up    Patient has shortness of breath with exertion and when hes hot, productive cough with white sputum. Patient and wife feel like breathing is worse since last visit.     Referring provider: Erven Colla, DO  HPI:  59 year old male former smoker followed in our office for COPD  PMH:   Smoker/ Smoking History: Former smoker.  Quit 2003.  40.5-pack-year smoking history.  Current smokeless tobacco user. Maintenance: Anoro Ellipta, Pulmicort nebs. Pt of: Dr. Vaughan Browner  05/09/2020  - Visit   59 year old male former smoker found our office for COPD.  Patient and spouse both feel that breathing has worsened since last visit.  Patient has a productive cough with white sputum.  Patient was last seen in our office by TP NP in February/2021.  Plan of care from that visit was as follows: Begin Pulmicort nebs twice daily, change Anoro to Stiolto, if is not covered then return back to [employer] daily.  Follow-up with Dr. Vaughan Browner in 2 to 3 months with pulmonary function testing.  Patient attempted to complete pulmonary function testing in April/2021 but this was aborted after he lost consciousness.  Patient is still maintained on Anoro Ellipta.  Patient has a history of COVID-19 infection in December/2020.  Patient reports that he received the J&J vaccine.  He does not know what date.  He does have the card at home.  He will work on sending [place] a picture of this.  Patient admits that he never really started taking the Pulmicort nebs.  He is still currently taking Anoro Ellipta daily.  He walks daily with his dogs.  Patient is up-to-date with his flu vaccine as well as COVID-19 vaccinations.  He is due for Pneumovax 23.  We will discuss this today.  Patient was walked in office today was able to complete 3 laps without oxygen desaturations below 88%  Patient with known ILD  on January/2020 high-res CT of chest.  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  He did not wear a mask or protective respirators this entire time.   Questionaires / Pulmonary Flowsheets:   ACT:  No flowsheet data found.  MMRC: mMRC Dyspnea Scale mMRC Score  05/09/2020 2  08/02/2019 3    Epworth:  No flowsheet data found.  Tests:    PFTs 07/13/2017 FVC 2.43 [46%), FEV1 0.92 [23%], F/F 38, TLC 125% Very severe obstruction with no bronchodilator response, + for air trapping and hyperinflation.  6MWT 09/18/16:  Walked 343 meters / Baseline Sat 98% on RA / Nadir Sat 96% on RA   IMAGING CT chest 9/1/9- apical predominant emphysema, biapical scarring, rib fractures, aortic atherosclerosis  CT head and neck 07/29/2017- lung images to demonstrate apical emphysema, biapical scarring with calcification which is slightly more prominent compared to 2009.  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, recommend correlation with occupational exposure history  LABS 08/05/16 Alpha-1 antitrypsin: MM (155)   08/25/13 ANA:  Negative   03/05/12 ABG on RA:  7.42 / 38 / 85 / 96%   08/28/09 ABG on  RA:  7.44 / 38 / 65 / 96%  07/29/2017 CBC-WBC count 4.6, eos 7%, absolute eosinophil count 322  07/07/2019 - sars cov2 - positive   FENO:  No results found for: NITRICOXIDE  PFT: PFT Results Latest Ref Rng & Units 11/11/2019 09/13/2016  FVC-Pre L 2.39 2.33  FVC-Predicted Pre % 46 44  FVC-Post L - 2.43  FVC-Predicted Post % - 46  Pre FEV1/FVC % % 34 40  Post FEV1/FCV % % - 38  FEV1-Pre L 0.81  0.93  FEV1-Predicted Pre % 20 23  FEV1-Post L - 0.92  DLCO uncorrected ml/min/mmHg 19.01 -  DLCO UNC% % 64 -  DLVA Predicted % 70 -  TLC L - 9.25  TLC % Predicted % - 125  RV % Predicted % - 290    WALK:  SIX MIN WALK 05/09/2020 08/12/2018 09/18/2016  Medications - - Advair -8am Hyfrocodone  Supplimental Oxygen during Test? (L/min) No No No  Laps - - 7  Partial Lap (in Meters) - - 7  Baseline BP (sitting) - - 126/72  Baseline Heartrate - - 65  Baseline Dyspnea (Borg Scale) - - 3  Baseline Fatigue (Borg Scale) - - 2  Baseline SPO2 - - 98  BP (sitting) - - 110/76  Heartrate - - 64  Dyspnea (Borg Scale) - - 8  Fatigue (Borg Scale) - - 6  SPO2 - - 96  BP (sitting) - - 110/74  Heartrate - - 57  SPO2 - - 98  Stopped or Paused before Six Minutes - - No  Distance Completed - - 343  Tech Comments: Patient walked average pace and maintained good O2 sats and completed all 3 laps with no complaints of shortness of breath pt walked slow pace all three laps, had SOB. kmw Pt walked a steady slow pace, he did not express any pain or discomfort during or after his walk, He tolerated the walk well and did not need to stop or pause     Imaging: No results found.  Lab Results:  CBC    Component Value Date/Time   WBC 3.6 (L) 04/27/2020 1043   RBC 4.85 04/27/2020 1043   HGB 15.2 04/27/2020 1043   HCT 45.0 04/27/2020 1043   PLT 203 04/27/2020 1043   MCV 92.8 04/27/2020 1043   MCH 31.3 04/27/2020 1043   MCHC 33.8 04/27/2020 1043   RDW 12.5 04/27/2020 1043   LYMPHSABS 1.6 07/29/2017 0717   MONOABS 0.5 07/29/2017 0717   EOSABS 0.3 07/29/2017 0717   BASOSABS 0.0 07/29/2017 0717    BMET    Component Value Date/Time   NA 140 04/27/2020 1043   NA 141 12/23/2014 0839   K 5.1 04/27/2020 1043   CL 100 04/27/2020 1043   CO2 33 (H) 04/27/2020 1043   GLUCOSE 103 (H) 04/27/2020 1043   BUN 13 04/27/2020 1043   BUN 15 12/23/2014 0839   CREATININE 0.88 04/27/2020 1043   CALCIUM 9.9  04/27/2020 1043   GFRNONAA 94 04/27/2020 1043   GFRAA 109 04/27/2020 1043    BNP No results found for: BNP  ProBNP No results found for: PROBNP  Specialty Problems      Pulmonary Problems   COPD, 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 Hepatitis A, Adult 11/15/2013, 05/05/2014  . Hepatitis B, adult 11/15/2013, 12/30/2013, 05/05/2014  . Influenza Split 07/13/2013  . Influenza,inj,Quad PF,6+ Mos 05/05/2014, 04/10/2015, 06/10/2016, 06/10/2017, 06/19/2018, 03/25/2019, 04/26/2020  . Influenza-Unspecified 06/28/2012  . Pneumococcal Conjugate-13 08/05/2016  . Pneumococcal Polysaccharide-23 03/02/2012, 05/09/2020    Past Medical History:  Diagnosis Date  . Asthmatic bronchitis   . Chronic back pain   . COPD (chronic obstructive pulmonary disease) ([place])   . Hepatitis C antibody test positive    noticed trying to give blood  . Insomnia   . Shortness of breath     Tobacco History: Social History   Tobacco Use  Smoking Status Former Smoker  . Packs/day: 1.50  . Years: 27.00  . Pack years: 40.50  . Types: Cigarettes  . Start date: 12/16/1974  . Quit date: 04/23/2002  . Years since quitting: 18.0  Smokeless Tobacco Current User  . Types: Chew   Ready to quit: Not Answered Counseling given: Not Answered   Continue to not smoke  Outpatient Encounter Medications as of 05/09/2020  Medication Sig  . albuterol (PROVENTIL) (2.5 MG/3ML) 0.083% nebulizer solution Take 3 mLs (2.5 mg total) by nebulization every 6 (six) hours as needed for wheezing or shortness of breath.  Marland Kitchen albuterol (VENTOLIN HFA) 108 (90 Base) MCG/ACT inhaler INHALE TWO PUFFS INTO THE LUNGS EVERY SIX HOURS AS NEEDED  . budesonide (PULMICORT) 0.25 MG/2ML nebulizer solution Take 2  mLs (0.25 mg total) by nebulization 2 (two) times daily.  . DULoxetine (CYMBALTA) 30 MG capsule Take 60 mg by mouth daily.  Marland Kitchen HYDROcodone-acetaminophen (NORCO) 7.5-325 MG tablet Take 1 tablet by mouth bid prn pain.  . traZODone (DESYREL) 100 MG tablet TAKE ONE TABLET BY MOUTH EVERY NIGHT AT BEDTIME  . umeclidinium-vilanterol (ANORO ELLIPTA) 62.5-25 MCG/INH AEPB Inhale 1 puff into the lungs daily.   No facility-administered encounter medications on file as of 05/09/2020.     Review of Systems  Review of Systems  Constitutional: Negative for activity change, chills, fatigue, fever and unexpected weight change.  HENT: Positive for congestion (baseline cough with white sputum ). Negative for postnasal drip, rhinorrhea, sinus pressure, sinus pain and sore throat.   Eyes: Negative.   Respiratory: Positive for cough, shortness of breath and wheezing.   Cardiovascular: Negative for chest pain and palpitations.  Gastrointestinal: Negative for constipation, diarrhea, nausea and vomiting.  Endocrine: Negative.   Genitourinary: Negative.   Musculoskeletal: Negative.   Skin: Negative.   Neurological: Negative for dizziness and headaches.  Psychiatric/Behavioral: Negative.  Negative for dysphoric mood. The patient is not nervous/anxious.   All other systems reviewed and are negative.    Physical Exam  BP 130/76 (BP Location: Left Arm, Patient Position: Sitting, Cuff Size: Normal)   Pulse 70   Temp (!) 96.4 F (35.8 C) (Temporal)   Ht 6' (1.829 m)   Wt 163 lb (73.9 kg)   SpO2 96%   BMI 22.11 kg/m   Wt Readings from Last 5 Encounters:  05/09/20 163 lb (73.9 kg)  04/26/20 164 lb (74.4 kg)  01/25/20 167 lb 6.4 oz (75.9 kg)  10/25/19 169 lb (76.7 kg)  09/06/19 171 lb (77.6 kg)    BMI Readings from Last 5 Encounters:  05/09/20 22.11 kg/m  04/26/20 22.24 kg/m  01/25/20 22.70 kg/m  10/25/19 22.92 kg/m  09/06/19 23.19 kg/m     Physical Exam Vitals and nursing note reviewed.    Constitutional:  General: He is not in acute distress.    Appearance: Normal appearance. He is normal weight.  HENT:     Head: Normocephalic and atraumatic.     Right Ear: Hearing and external ear normal.     Left Ear: Hearing and external ear normal.     Nose: Nose normal. No mucosal edema or rhinorrhea.     Right Turbinates: Not enlarged.     Left Turbinates: Not enlarged.     Mouth/Throat:     Mouth: Mucous membranes are dry.     Pharynx: Oropharynx is clear. No oropharyngeal exudate.  Eyes:     Pupils: Pupils are equal, round, and reactive to light.  Cardiovascular:     Rate and Rhythm: Normal rate and regular rhythm.     Pulses: Normal pulses.     Heart sounds: Normal heart sounds. No murmur heard.   Pulmonary:     Effort: Pulmonary effort is normal.     Breath sounds: Wheezing (Slight expiratory wheeze) present. No decreased breath sounds or rales.  Musculoskeletal:     Cervical back: Normal range of motion.     Right lower leg: No edema.     Left lower leg: No edema.  Lymphadenopathy:     Cervical: No cervical adenopathy.  Skin:    General: Skin is warm and dry.     Capillary Refill: Capillary refill takes less than 2 seconds.     Findings: No erythema or rash.  Neurological:     General: No focal deficit present.     Mental Status: He is alert and oriented to person, place, and time.     Motor: No weakness.     Coordination: Coordination normal.     Gait: Gait is intact. Gait normal.  Psychiatric:        Mood and Affect: Mood normal.        Behavior: Behavior normal. Behavior is cooperative.        Thought Content: Thought content normal.        Judgment: Judgment normal.       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 Melvyn Novas at [facility] clinic in 6 to 8 weeks and 30-minute time slot Pneumovax 23 today Offered pulmonary rehab referral, patient will think about it and let us know  Smokeless tobacco use Plan: Emphasized need to stop smokeless tobacco use   Healthcare maintenance Up-to-date with flu vaccine Up-to-date with COVID-19 vaccinations  Plan: Patient to send in picture of COVID-19 vaccination cards we can update records Pneumovax 23 today    Return in about 6 weeks (around 06/20/2020), or if symptoms worsen or fail to improve, for [facility], NEW PULMONOLOGIST IN 63min SLOT.   Lauraine Rinne, NP 05/09/2020   This appointment required 42 minutes of patient care (this includes precharting, chart review, review of results, face-to-face care, etc.).

## 2020-05-10 ENCOUNTER — Other Ambulatory Visit: Payer: Self-pay

## 2020-05-10 ENCOUNTER — Ambulatory Visit (HOSPITAL_COMMUNITY)
Admission: RE | Admit: 2020-05-10 | Discharge: 2020-05-10 | Disposition: A | Discharge status: Home / Self Care | Payer: No Typology Code available for payment source | Source: Ambulatory Visit | Attending: Neurology | Admitting: Neurology

## 2020-05-10 DIAGNOSIS — M545 Low back pain, unspecified: Secondary | ICD-10-CM | POA: Insufficient documentation

## 2020-05-17 DIAGNOSIS — F5104 Psychophysiologic insomnia: Secondary | ICD-10-CM | POA: Diagnosis not present

## 2020-05-17 DIAGNOSIS — M19012 Primary osteoarthritis, left shoulder: Secondary | ICD-10-CM | POA: Diagnosis not present

## 2020-05-17 DIAGNOSIS — M5459 Other low back pain: Secondary | ICD-10-CM | POA: Diagnosis not present

## 2020-05-17 DIAGNOSIS — J449 Chronic obstructive pulmonary disease, unspecified: Secondary | ICD-10-CM | POA: Diagnosis not present

## 2020-05-23 ENCOUNTER — Telehealth: Payer: Self-pay | Admitting: Pulmonary Disease

## 2020-05-23 NOTE — Telephone Encounter (Signed)
Called and spoke to rep with [facility]. Advised her the pt is not starting Lon[place]a. She closed the encounter. Nothing further needed at this time.

## 2020-06-13 DIAGNOSIS — M5459 Other low back pain: Secondary | ICD-10-CM | POA: Diagnosis not present

## 2020-06-13 DIAGNOSIS — F5104 Psychophysiologic insomnia: Secondary | ICD-10-CM | POA: Diagnosis not present

## 2020-06-13 DIAGNOSIS — M19012 Primary osteoarthritis, left shoulder: Secondary | ICD-10-CM | POA: Diagnosis not present

## 2020-06-13 DIAGNOSIS — J449 Chronic obstructive pulmonary disease, unspecified: Secondary | ICD-10-CM | POA: Diagnosis not present

## 2020-06-13 DIAGNOSIS — M545 Low back pain, unspecified: Secondary | ICD-10-CM | POA: Diagnosis not present

## 2020-06-21 ENCOUNTER — Other Ambulatory Visit: Payer: Self-pay | Admitting: Pulmonary Disease

## 2020-06-27 ENCOUNTER — Other Ambulatory Visit: Payer: Self-pay

## 2020-06-27 ENCOUNTER — Ambulatory Visit (INDEPENDENT_AMBULATORY_CARE_PROVIDER_SITE_OTHER): Payer: No Typology Code available for payment source | Admitting: Internal Medicine

## 2020-06-27 ENCOUNTER — Encounter: Payer: Self-pay | Admitting: Internal Medicine

## 2020-06-27 DIAGNOSIS — J449 Chronic obstructive pulmonary disease, unspecified: Secondary | ICD-10-CM

## 2020-06-27 DIAGNOSIS — J634 Siderosis: Secondary | ICD-10-CM | POA: Insufficient documentation

## 2020-06-27 MED ORDER — BREZTRI AEROSPHERE 160-9-4.8 MCG/ACT IN AERO: 2.0000 | INHALATION_SPRAY | Freq: Two times a day (BID) | RESPIRATORY_TRACT | 11 refills | Status: DC

## 2020-06-27 MED ORDER — BREZTRI AEROSPHERE 160-9-4.8 MCG/ACT IN AERO: 2.0000 | INHALATION_SPRAY | Freq: Two times a day (BID) | RESPIRATORY_TRACT | 0 refills | Status: DC

## 2020-06-27 NOTE — Assessment & Plan Note (Signed)
See CT chest  08/27/2018   Appears benign, no dedicated f/u indicated noting that this dz is mainly upper lobe and unlikely to impact him separately in a different way than his copd

## 2020-06-27 NOTE — Patient Instructions (Addendum)
Stop anoro and the budesonide nebulizer (because they are equivalent of Breztri)    Plan A = Automatic = Always=    Breztri Take 2 puffs first thing in am and then another 2 puffs about 12 hours later.    Work on inhaler technique:  relax and gently blow all the way out then take a nice smooth deep breath back in, triggering the inhaler at same time you start breathing in.  Hold for up to 5 seconds if you can. Blow out thru nose. Rinse and gargle with water when done      Plan B = Backup (to supplement plan A, not to replace it) Only use your albuterol inhaler as a rescue medication to be used if you can't catch your breath by resting or doing a relaxed purse lip breathing pattern.  - The less you use it, the better it will work when you need it. - Ok to use the inhaler up to 2 puffs  every 4 hours if you must but call for appointment if use goes up over your usual need - Don't leave home without it !!  (think of it like the spare tire for your car)   Plan C = Crisis (instead of Plan B but only if Plan B stops working) - only use your albuterol nebulizer if you first try Plan B and it fails to help > ok to use the nebulizer up to every 4 hours but if start needing it regularly call for immediate appointment   Try albuterol 15 min before an activity that you know would make you short of breath and see if it makes any difference and if makes none then don't take it after activity unless you can't catch your breath.       Please schedule a follow up visit in 6 months but call sooner if needed  Add: rec booster with moderna or pfizer if he indeed received the J/J > 6 m ago

## 2020-06-27 NOTE — Assessment & Plan Note (Addendum)
Quit smoking 2003  - 06/27/2020  After extensive coaching inhaler device,  effectiveness =    75% (short Ti) > try 2 week sample of breztri instead of anoro/pulmicort   Group D in terms of symptom/risk and laba/lama/ICS  therefore appropriate rx at this point >>>  breztri plus prn saba (vs trelegy if insurance prefers)  Plus prn saba  Re Laurence Ferrari I spent extra time with pt today reviewing appropriate use of albuterol for prn use on exertion with the following points: 1) saba is for relief of sob that does not improve by walking a slower pace or resting but rather if the pt does not improve after trying this first. 2) If the pt is convinced, as many are, that saba helps recover from activity faster then it's easy to tell if this is the case by re-challenging : ie stop, take the inhaler, then p 5 minutes try the exact same activity (intensity of workload) that just caused the symptoms and see if they are substantially diminished or not after saba 3) if there is an activity that reproducibly causes the symptoms, try the saba 15 min before the activity on alternate days   If in fact the saba really does help, then fine to continue to use it prn but advised may need to look closer at the maintenance regimen being used to achieve better control of airways disease with exertion.      I strongly recommended pt take booster vaccination if > 6 m since J/J either of the vaccines available through local drugstores based on updated information on millions of Americans treated with the [place] products  which have proven both safe and  effective even against the new delta variant.           Each maintenance medication was reviewed in detail including emphasizing most importantly the difference between maintenance and prns and under what circumstances the prns are to be triggered using an action plan format where appropriate.  Total time for H and P, chart review, counseling, teaching device and  generating customized AVS unique to this office visit with pt new to me / charting   >  40 min

## 2020-06-27 NOTE — Progress Notes (Signed)
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 Follow-up    Former patient of Dr Vaughan Browner. Breathing is unchanged since the last visit. He is his albuterol inhaler once a day on average and has not used albuterol neb. He has some cough in the evenings- prod with clear sputum.   Dyspnea: walks dogs slow pace x 30-40 min / does not do shopping  Cough: minimal mucoid Sleep: flat bed s resp issues SABA use: rare  02 none   No obvious day to day or daytime variability or assoc purulent sputum or mucus plugs or hemoptysis or cp or chest tightness, subjective wheeze or overt sinus or hb symptoms.   Sleeping  without nocturnal  or early am exacerbation  of respiratory  c/o's or need for noct saba. Also denies any obvious fluctuation of symptoms with weather or environmental changes or other aggravating or alleviating factors except as outlined above   No unusual exposure hx or h/o childhood pna/ asthma or knowledge of premature birth.  Current Allergies, Complete Past Medical History, Past Surgical History, Family History, and Social History were reviewed in [employer] record.  ROS  The following are not active complaints unless bolded Hoarseness, sore throat, dysphagia, dental problems, itching, sneezing,  nasal congestion or discharge of excess mucus or purulent secretions, ear ache,   fever, chills, sweats, unintended wt loss or wt gain, classically pleuritic or exertional cp,  orthopnea pnd or arm/hand swelling  or leg swelling, presyncope,  palpitations, abdominal pain, anorexia, nausea, vomiting, diarrhea  or change in bowel habits or change in bladder habits, change in stools or change in urine, dysuria, hematuria,  rash, arthralgias, visual complaints, headache, numbness, weakness or ataxia or problems with walking or coordination,  change in mood or  memory.            Past Medical History:  Diagnosis Date  . Asthmatic bronchitis   . Chronic back pain   . COPD (chronic obstructive pulmonary disease) ([place])   . Hepatitis C antibody test positive    noticed trying to give blood  . Insomnia   . Shortness of breath     Outpatient Medications Prior to Visit  Medication Sig Dispense Refill  . albuterol (PROVENTIL) (2.5 MG/3ML) 0.083% nebulizer solution Take 3 mLs (2.5 mg total) by nebulization every 6 (six) hours as needed for wheezing or shortness of breath. 75 mL 12  . albuterol (VENTOLIN HFA) 108 (90 Base) MCG/ACT inhaler INHALE TWO PUFFS INTO THE LUNGS EVERY SIX HOURS AS NEEDED 8.5 g 1  . ANORO ELLIPTA 62.5-25 MCG/INH AEPB INHALE 1 PUFF INTO THE LUNGS DAILY 60 each 2  . budesonide (PULMICORT) 0.25 MG/2ML nebulizer solution Take 2 mLs (0.25 mg total) by nebulization 2 (two) times daily. 60 mL 12  . DULoxetine (CYMBALTA) 30 MG capsule Take 60 mg by mouth daily.    Marland Kitchen HYDROcodone-acetaminophen (NORCO) 7.5-325 MG tablet Take 1 tablet by mouth bid prn pain. 60 tablet 0  . traZODone (DESYREL) 100 MG  tablet TAKE ONE TABLET BY MOUTH EVERY NIGHT AT BEDTIME 90 tablet 1   No facility-administered medications prior to visit.     Objective:     BP 102/64 (BP Location: Left Arm, Cuff Size: Normal)   Pulse 69   Temp 97.7 F (36.5 C) (Temporal)   Ht 6' (1.829 m)   Wt 154 lb (69.9 kg)   SpO2 96% Comment: on RA  BMI 20.89 kg/m   SpO2: 96 % (on RA)   Hoarse amb wm nad   HEENT : pt wearing mask not removed for exam due to covid -19 concerns.    NECK :  without JVD/Nodes/TM/ nl carotid upstrokes bilaterally   LUNGS: no  acc muscle use,  Mod barrel  contour chest wall with bilateral  Distant bs s audible wheeze and  without cough on insp or exp maneuvers and mod  Hyperresonant  to  percussion bilaterally     CV:  RRR  no s3 or murmur or increase in P2, and no edema   ABD:  soft and nontender with pos mid insp Hoover's  in the supine position. No bruits or organomegaly appreciated, bowel sounds nl  MS:     ext warm without deformities, calf tenderness, cyanosis or clubbing No obvious joint restrictions   SKIN: warm and dry without lesions    NEURO:  alert, approp, nl sensorium with  no motor or cerebellar deficits apparent.        I personally reviewed images and agree with radiology impression as follows:   Chest HRCT  08/27/2018 1. Severe centrilobular and paraseptal emphysema with mild diffuse bronchial wall thickening, compatible with the provided history of COPD. 2. Calcified mediastinal, bilateral hilar and upper retroperitoneal adenopathy. Calcified masslike regions in the upper lobes bilaterally with associated distortion, increased since 2009, suggesting progressive massive fibrosis. This spectrum of findings suggests a superimposed pneumoconiosis.      Assessment   COPD  GOLD IV  Quit smoking 2003  - 06/27/2020  After extensive coaching inhaler device,  effectiveness =    75% (short Ti) > try 2 week sample of breztri instead of anoro/pulmicort   Group D in terms of symptom/risk and laba/lama/ICS  therefore appropriate rx at this point >>>  breztri plus prn saba (vs trelegy if insurance prefers)  Plus prn saba  Re Laurence Ferrari I spent extra time with pt today reviewing appropriate use of albuterol for prn use on exertion with the following points: 1) saba is for relief of sob that does not improve by walking a slower pace or resting but rather if the pt does not improve after trying this first. 2) If the pt is convinced, as many are, that saba helps recover from activity faster then it's easy  to tell if this is the case by re-challenging : ie stop, take the inhaler, then p 5 minutes try the exact same activity (intensity of workload) that just caused the symptoms and see if they are substantially diminished or not after saba 3) if there is an activity that reproducibly causes the symptoms, try the saba 15 min before the activity on alternate days   If in fact the saba really does help, then fine to continue to use it prn but advised may need to look closer at the maintenance regimen being used to achieve better control of airways disease with exertion.     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           Each maintenance medication was reviewed in detail including emphasizing most importantly the difference between maintenance and prns and under what circumstances the prns are to be triggered using an action plan format where appropriate.  Total time for H and P, chart review, counseling, teaching device and generating customized AVS unique to this office visit with pt new to me / charting >40 min          Christinia Gully, MD 06/27/2020

## 2020-06-29 ENCOUNTER — Telehealth: Payer: Self-pay | Admitting: *Deleted

## 2020-06-29 NOTE — Telephone Encounter (Signed)
Spoke with the pt's spouse ok per DPR and notified of recommendations per Dr Melvyn Novas  She verbalized understanding and states will inform the pt

## 2020-06-29 NOTE — Telephone Encounter (Signed)
-----   Message from Tanda Rockers, MD sent at 06/27/2020  6:05 PM EST ----- I think he took the J and J so let him know I talked to ID and they rec a booster with either moderna or pfizer at 6 m from the J and J which I think is probably due now

## 2020-07-11 DIAGNOSIS — J449 Chronic obstructive pulmonary disease, unspecified: Secondary | ICD-10-CM | POA: Diagnosis not present

## 2020-07-11 DIAGNOSIS — M19012 Primary osteoarthritis, left shoulder: Secondary | ICD-10-CM | POA: Diagnosis not present

## 2020-07-11 DIAGNOSIS — F5104 Psychophysiologic insomnia: Secondary | ICD-10-CM | POA: Diagnosis not present

## 2020-07-11 DIAGNOSIS — M5459 Other low back pain: Secondary | ICD-10-CM | POA: Diagnosis not present

## 2020-07-11 DIAGNOSIS — I10 Essential (primary) hypertension: Secondary | ICD-10-CM | POA: Diagnosis not present

## 2020-07-27 DIAGNOSIS — Z23 Encounter for immunization: Secondary | ICD-10-CM | POA: Diagnosis not present

## 2020-09-05 DIAGNOSIS — M5459 Other low back pain: Secondary | ICD-10-CM | POA: Diagnosis not present

## 2020-09-05 DIAGNOSIS — M19012 Primary osteoarthritis, left shoulder: Secondary | ICD-10-CM | POA: Diagnosis not present

## 2020-09-05 DIAGNOSIS — J449 Chronic obstructive pulmonary disease, unspecified: Secondary | ICD-10-CM | POA: Diagnosis not present

## 2020-09-05 DIAGNOSIS — F5104 Psychophysiologic insomnia: Secondary | ICD-10-CM | POA: Diagnosis not present

## 2020-09-05 DIAGNOSIS — I10 Essential (primary) hypertension: Secondary | ICD-10-CM | POA: Diagnosis not present

## 2020-10-24 ENCOUNTER — Ambulatory Visit (INDEPENDENT_AMBULATORY_CARE_PROVIDER_SITE_OTHER): Payer: No Typology Code available for payment source | Admitting: Family Medicine

## 2020-10-24 ENCOUNTER — Encounter: Payer: Self-pay | Admitting: Family Medicine

## 2020-10-24 ENCOUNTER — Other Ambulatory Visit: Payer: Self-pay

## 2020-10-24 VITALS — BP 132/74 | HR 64 | Temp 97.9°F | Ht 72.0 in | Wt 153.0 lb

## 2020-10-24 DIAGNOSIS — G8929 Other chronic pain: Secondary | ICD-10-CM

## 2020-10-24 DIAGNOSIS — F5101 Primary insomnia: Secondary | ICD-10-CM

## 2020-10-24 DIAGNOSIS — M5440 Lumbago with sciatica, unspecified side: Secondary | ICD-10-CM

## 2020-10-24 DIAGNOSIS — K7460 Unspecified cirrhosis of liver: Secondary | ICD-10-CM

## 2020-10-24 DIAGNOSIS — J634 Siderosis: Secondary | ICD-10-CM

## 2020-10-24 DIAGNOSIS — J449 Chronic obstructive pulmonary disease, unspecified: Secondary | ICD-10-CM

## 2020-10-24 NOTE — Progress Notes (Signed)
Patient ID: Timothy Davis, male    DOB: [birth year], 60 y.o.   MRN: [id number]   Chief Complaint  Patient presents with  . Follow-up  . COPD  . Insomnia   Subjective:    HPI  6 month follow up for copd, chronic pain and insomnia.  Pt sees specialist for copd and specialist for pain management.   Seeing pulm every 2 months for copd and pneumoconiosis. Doing good with the inhalers.   chronic back pain/chronic pain syndrome- Seeing Dr. Merlene Laughter, Neurology- gave pt cymbalta 60mg  per day. Pt stating only taking 30mg  per day. Not able to take the 60mg  daily, pt stating he didn't feel good on the 60mg  per day. Seeing him next week.  Taking trazodone 100mg  for sleep.   Medical History Timothy Davis has a past medical history of Asthmatic bronchitis, Chronic back pain, COPD (chronic obstructive pulmonary disease) ([place]), Hepatitis C antibody test positive, Insomnia, and Shortness of breath.   Outpatient Encounter Medications as of 10/24/2020  Medication Sig  . albuterol (PROVENTIL) (2.5 MG/3ML) 0.083% nebulizer solution Take 3 mLs (2.5 mg total) by nebulization every 6 (six) hours as needed for wheezing or shortness of breath.  Marland Kitchen albuterol (VENTOLIN HFA) 108 (90 Base) MCG/ACT inhaler INHALE TWO PUFFS INTO THE LUNGS EVERY SIX HOURS AS NEEDED  . Budeson-Glycopyrrol-Formoterol (BREZTRI AEROSPHERE) 160-9-4.8 MCG/ACT AERO Inhale 2 puffs into the lungs 2 (two) times daily.  . DULoxetine (CYMBALTA) 30 MG capsule Take 60 mg by mouth daily.  Marland Kitchen HYDROcodone-acetaminophen (NORCO) 7.5-325 MG tablet Take 1 tablet by mouth bid prn pain.  . traZODone (DESYREL) 100 MG tablet TAKE ONE TABLET BY MOUTH EVERY NIGHT AT BEDTIME   No facility-administered encounter medications on file as of 10/24/2020.     Review of Systems  Constitutional: Negative for chills and fever.  HENT: Negative for congestion, rhinorrhea and sore throat.   Respiratory: Negative for cough, shortness of breath and wheezing.   Cardiovascular:  Negative for chest pain and leg swelling.  Gastrointestinal: Negative for abdominal pain, diarrhea, nausea and vomiting.  Genitourinary: Negative for dysuria and frequency.  Musculoskeletal: Positive for back pain (chronic).  Skin: Negative for rash.  Neurological: Negative for dizziness, weakness and headaches.  Psychiatric/Behavioral: Negative for dysphoric mood and sleep disturbance. The patient is not nervous/anxious.      Vitals BP 132/74   Pulse 64   Temp 97.9 F (36.6 C)   Ht 6' (1.829 m)   Wt 153 lb (69.4 kg)   SpO2 95%   BMI 20.75 kg/m   Objective:   Physical Exam Vitals and nursing note reviewed.  Constitutional:      General: He is not in acute distress.    Appearance: Normal appearance. He is not ill-appearing.  Cardiovascular:     Rate and Rhythm: Normal rate and regular rhythm.     Pulses: Normal pulses.     Heart sounds: Normal heart sounds. No murmur heard.   Pulmonary:     Effort: Pulmonary effort is normal. No respiratory distress.     Breath sounds: Normal breath sounds. No wheezing, rhonchi or rales.  Musculoskeletal:        General: Normal range of motion.  Skin:    General: Skin is warm and dry.     Findings: No rash.  Neurological:     General: No focal deficit present.     Mental Status: He is alert and oriented to person, place, and time.  Psychiatric:  Mood and Affect: Mood normal.        Behavior: Behavior normal.        Thought Content: Thought content normal.        Judgment: Judgment normal.      Assessment and Plan   1. COPD  GOLD IV   2. Cirrhosis of liver without ascites, unspecified hepatic cirrhosis type ([place])  3. 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 Chronic midline low back pain with sciatica, sciatica laterality unspecified  5. Primary insomnia   Chronic back pain- Since pt seeing Dr. Merlene Laughter, neuro, for pain management and pulm for his copd, pt can go out to 1x per yr visits for labs unless needing [place]  sooner.  Copd- stage 4, pneumoconiosis- stable. Cont f/u with Dr. Melvyn Novas.  Insomnia- cont with trazodone.  F/u with Dr. Merlene Laughter for refills.   Cirrhosis- stable. Labs normal on last visit. Cont to monitor.  Order labs on next visit to be done 1 wk prior.  Return in about 6 months (around 04/26/2021) for f/u for copd and labs.   10/24/2020

## 2020-10-31 DIAGNOSIS — F5104 Psychophysiologic insomnia: Secondary | ICD-10-CM | POA: Diagnosis not present

## 2020-10-31 DIAGNOSIS — I10 Essential (primary) hypertension: Secondary | ICD-10-CM | POA: Diagnosis not present

## 2020-10-31 DIAGNOSIS — M19012 Primary osteoarthritis, left shoulder: Secondary | ICD-10-CM | POA: Diagnosis not present

## 2020-10-31 DIAGNOSIS — J449 Chronic obstructive pulmonary disease, unspecified: Secondary | ICD-10-CM | POA: Diagnosis not present

## 2020-10-31 DIAGNOSIS — M5459 Other low back pain: Secondary | ICD-10-CM | POA: Diagnosis not present

## 2020-12-26 DIAGNOSIS — F5104 Psychophysiologic insomnia: Secondary | ICD-10-CM | POA: Diagnosis not present

## 2020-12-26 DIAGNOSIS — M5459 Other low back pain: Secondary | ICD-10-CM | POA: Diagnosis not present

## 2020-12-26 DIAGNOSIS — I10 Essential (primary) hypertension: Secondary | ICD-10-CM | POA: Diagnosis not present

## 2020-12-26 DIAGNOSIS — M19012 Primary osteoarthritis, left shoulder: Secondary | ICD-10-CM | POA: Diagnosis not present

## 2020-12-26 DIAGNOSIS — J449 Chronic obstructive pulmonary disease, unspecified: Secondary | ICD-10-CM | POA: Diagnosis not present

## 2021-01-11 ENCOUNTER — Encounter: Payer: Self-pay | Admitting: Internal Medicine

## 2021-01-24 ENCOUNTER — Other Ambulatory Visit: Payer: Self-pay

## 2021-01-24 ENCOUNTER — Ambulatory Visit (INDEPENDENT_AMBULATORY_CARE_PROVIDER_SITE_OTHER): Payer: No Typology Code available for payment source | Admitting: Internal Medicine

## 2021-01-24 DIAGNOSIS — J449 Chronic obstructive pulmonary disease, unspecified: Secondary | ICD-10-CM | POA: Diagnosis not present

## 2021-01-24 DIAGNOSIS — J634 Siderosis: Secondary | ICD-10-CM

## 2021-01-24 MED ORDER — BREZTRI AEROSPHERE 160-9-4.8 MCG/ACT IN AERO: 2.0000 | INHALATION_SPRAY | Freq: Two times a day (BID) | RESPIRATORY_TRACT | 0 refills | Status: DC

## 2021-01-24 NOTE — Patient Instructions (Signed)
Plan A = Automatic = Always=   Breztri Take 2 puffs first thing in am and then another 2 puffs about 12 hours later.    Work on inhaler technique:  relax and gently blow all the way out then take a nice smooth full deep breath back in, triggering the inhaler at same time you start breathing in.  Hold for up to 5 seconds if you can. Blow out thru nose. Rinse and gargle with water when done.  If mouth or throat bother you at all,  try brushing teeth/gums/tongue with arm and hammer toothpaste/ make a slurry and gargle and spit out.     Plan B = Backup (to supplement plan A, not to replace it) Only use your albuterol inhaler as a rescue medication to be used if you can't catch your breath by resting or doing a relaxed purse lip breathing pattern.  - The less you use it, the better it will work when you need it. - Ok to use the inhaler up to 2 puffs  every 4 hours if you must but call for appointment if use goes up over your usual need - Don't leave home without it !!  (think of it like starter fluid or a spare tire for your car)   Plan C = Crisis (instead of Plan B but only if Plan B stops working) - only use your albuterol nebulizer if you first try Plan B and it fails to help > ok to use the nebulizer up to every 4 hours but if start needing it regularly call for immediate appointment   Please schedule a follow up visit in 6  months but call sooner if needed

## 2021-01-24 NOTE — Progress Notes (Signed)
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 Breathing is unchanged since the last visit. He is his albuterol inhaler once a day on average and has not used albuterol neb. He has some cough in the evenings- prod with clear sputum.   Dyspnea: walks dogs slow pace x 30-40 min / does not do shopping  Cough: minimal mucoid Sleep: flat bed s resp issues SABA use: rare  02 none   Rec Stop anoro and the budesonide nebulizer (because they are equivalent of Breztri)  Plan A = Automatic = Always=    Breztri Take 2 puffs first thing in am and then another 2 puffs about 12 hours later.  Work on inhaler technique:   Plan B = Backup (to supplement plan A, not to replace it) Only use your albuterol inhaler as a rescue medication Plan C = Crisis (instead of Plan B but only if Plan B stops working) - only use your albuterol nebulizer if you first try Plan B and it fails to help Try albuterol 15 min before an activity that you know would make you short of breath and see if it makes any difference and if makes none then don't take it after activity unless you can't catch your breath. Please schedule a follow up visit in 6 months but call sooner if needed  Add: rec booster with moderna or pfizer if he indeed received the J/J > 6 m ago     01/24/2021  f/u ov/[place] office/Cherri Yera re:  COPD IV breztri Chief Complaint  Patient presents with   Follow-up    Breztri only working for 4-5 hours and then he can tell it wears off bc he starts  feeling more SOB. He has been coughing with clear to grey sputum and also wheezing. He is using his albuterol inhaler 3-4 x per day. He rarely uses neb.    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   Sleeping as above without nocturnal  or early am exacerbation  of respiratory  c/o's or need for noct saba. Also denies any obvious fluctuation of symptoms with weather or environmental changes or other aggravating or alleviating factors except as outlined above   No unusual exposure hx or h/o childhood pna/ asthma or knowledge of premature birth.  Current Allergies, Complete Past Medical History, Past Surgical History, Family History, and Social History were reviewed  in [employer] record.  ROS  The following are not active complaints unless bolded Hoarseness, sore throat, dysphagia, dental problems, itching, sneezing,  nasal congestion or discharge of excess mucus or purulent secretions, ear ache,   fever, chills, sweats, unintended wt loss or wt gain, classically pleuritic or exertional cp,  orthopnea pnd or arm/hand swelling  or leg swelling, presyncope, palpitations, abdominal pain, anorexia, nausea, vomiting, diarrhea  or change in bowel habits or change in bladder habits, change in stools or change in urine, dysuria, hematuria,  rash, arthralgias, visual complaints, headache, numbness, weakness or ataxia or problems with walking or coordination,  change in mood or  memory.        Current  Meds  Medication Sig   albuterol (PROVENTIL) (2.5 MG/3ML) 0.083% nebulizer solution Take 3 mLs (2.5 mg total) by nebulization every 6 (six) hours as needed for wheezing or shortness of breath.   albuterol (VENTOLIN HFA) 108 (90 Base) MCG/ACT inhaler INHALE TWO PUFFS INTO THE LUNGS EVERY SIX HOURS AS NEEDED   Budeson-Glycopyrrol-Formoterol (BREZTRI AEROSPHERE) 160-9-4.8 MCG/ACT AERO Inhale 2 puffs into the lungs 2 (two) times daily.   DULoxetine (CYMBALTA) 30 MG capsule Take 60 mg by mouth daily.   HYDROcodone-acetaminophen (NORCO) 7.5-325 MG tablet Take 1 tablet by mouth bid prn pain.   traZODone (DESYREL) 100 MG tablet TAKE ONE TABLET BY MOUTH EVERY NIGHT AT BEDTIME          Objective:       Wt Readings from Last 3 Encounters:  01/24/21 143 lb 3.2 oz (65 kg)  10/24/20 153 lb (69.4 kg)  06/27/20 154 lb (69.9 kg)      Vital signs reviewed  01/24/2021  - Note at rest 02 sats  95% on RA   General appearance:    amb wm nad    HEENT : pt wearing mask not removed for exam due to covid -19 concerns.    NECK :  without JVD/Nodes/TM/ nl carotid upstrokes bilaterally   LUNGS: no acc muscle use,  Mod barrel  contour chest wall with bilateral  Distant bs s audible wheeze / a few crackles in bases bilaterally and  without cough on insp or exp maneuvers and mod  Hyperresonant  to  percussion bilaterally     CV:  RRR  no s3 or murmur or increase in P2, and no edema   ABD:  soft and nontender with pos mid insp Hoover's  in the supine position. No bruits or organomegaly appreciated, bowel sounds nl  MS:     ext warm without deformities, calf tenderness, cyanosis or clubbing No obvious joint restrictions   SKIN: warm and dry without lesions    NEURO:  alert, approp, nl sensorium with  no motor or cerebellar deficits apparent.           Assessment

## 2021-01-25 ENCOUNTER — Encounter: Payer: Self-pay | Admitting: Internal Medicine

## 2021-01-25 NOTE — Assessment & Plan Note (Signed)
Quit smoking 2003  - 06/27/2020  After extensive coaching inhaler device,  effectiveness =    75% (short Ti) > try 2 week sample of breztri instead of anoro/pulmicort - 01/24/2021  After extensive coaching inhaler device,  effectiveness =    75% > continue breztri 2bid    Group D in terms of symptom/risk and laba/lama/ICS  therefore appropriate rx at this point >>>  Continue breztri and approp saba  I spent extra time with pt today reviewing appropriate use of albuterol for prn use on exertion with the following points: 1) saba is for relief of sob that does not improve by walking a slower pace or resting but rather if the pt does not improve after trying this first. 2) If the pt is convinced, as many are, that saba helps recover from activity faster then it's easy to tell if this is the case by re-challenging : ie stop, take the inhaler, then p 5 minutes try the exact same activity (intensity of workload) that just caused the symptoms and see if they are substantially diminished or not after saba 3) if there is an activity that reproducibly causes the symptoms, try the saba 15 min before the activity on alternate days   If in fact the saba really does help, then fine to continue to use it prn but advised may need to look closer at the maintenance regimen being used to achieve better control of airways disease with exertion.

## 2021-01-25 NOTE — Assessment & Plan Note (Signed)
See CT chest  08/27/2018  Calcified mediastinal, bilateral hilar and upper retroperitoneal adenopathy. Calcified masslike regions in the upper lobes bilaterally with associated distortion, increased since 2009, suggesting progressive massive fibrosis. This spectrum of findings suggests a superimposed pneumoconiosis   Few crackles on exam/ chronic cough but no specific rx needed/ reviewed use of mucinex dm prn / add flutter valve if gets any worse  F/u in 6 m, sooner if needed         Each maintenance medication was reviewed in detail including emphasizing most importantly the difference between maintenance and prns and under what circumstances the prns are to be triggered using an action plan format where appropriate.  Total time for H and P, chart review, counseling, reviewing hfa device(s) and generating customized AVS unique to this office visit / same day charting = 31 min

## 2021-02-21 DIAGNOSIS — I10 Essential (primary) hypertension: Secondary | ICD-10-CM | POA: Diagnosis not present

## 2021-02-21 DIAGNOSIS — J449 Chronic obstructive pulmonary disease, unspecified: Secondary | ICD-10-CM | POA: Diagnosis not present

## 2021-02-21 DIAGNOSIS — M5459 Other low back pain: Secondary | ICD-10-CM | POA: Diagnosis not present

## 2021-02-21 DIAGNOSIS — M19012 Primary osteoarthritis, left shoulder: Secondary | ICD-10-CM | POA: Diagnosis not present

## 2021-02-21 DIAGNOSIS — F5104 Psychophysiologic insomnia: Secondary | ICD-10-CM | POA: Diagnosis not present

## 2021-03-29 ENCOUNTER — Encounter: Payer: Self-pay | Admitting: Internal Medicine

## 2021-05-14 ENCOUNTER — Ambulatory Visit: Payer: No Typology Code available for payment source | Admitting: Gastroenterology

## 2021-05-16 DIAGNOSIS — F5104 Psychophysiologic insomnia: Secondary | ICD-10-CM | POA: Diagnosis not present

## 2021-05-16 DIAGNOSIS — M19012 Primary osteoarthritis, left shoulder: Secondary | ICD-10-CM | POA: Diagnosis not present

## 2021-05-16 DIAGNOSIS — J449 Chronic obstructive pulmonary disease, unspecified: Secondary | ICD-10-CM | POA: Diagnosis not present

## 2021-05-16 DIAGNOSIS — I10 Essential (primary) hypertension: Secondary | ICD-10-CM | POA: Diagnosis not present

## 2021-05-16 DIAGNOSIS — M5459 Other low back pain: Secondary | ICD-10-CM | POA: Diagnosis not present

## 2021-07-06 ENCOUNTER — Telehealth: Payer: Self-pay | Admitting: Internal Medicine

## 2021-07-06 MED ORDER — BREZTRI AEROSPHERE 160-9-4.8 MCG/ACT IN AERO: 2.0000 | INHALATION_SPRAY | Freq: Two times a day (BID) | RESPIRATORY_TRACT | 0 refills | Status: DC

## 2021-07-06 NOTE — Telephone Encounter (Signed)
Patient came by the office for a sample of Breztri and mentioned something about getting his medication for free. Patient stated that his OV is not until 07/24/21 and needed a sample to get him through. Provided patient with 2 samples and also printed off patient assistance paperwork and gave that to the patient. Told him to fill out entire front page and bring it with him to his appt and that we would fill out the rest and fax it over to [facility]. Patient then asked what about after my appointment. Advised him that that would have to be discussed at the time of his appt as Dr. Melvyn Novas was not here. Patient expressed understanding. Nothing further needed at this time.

## 2021-07-24 ENCOUNTER — Encounter: Payer: Self-pay | Admitting: Internal Medicine

## 2021-07-24 ENCOUNTER — Ambulatory Visit (INDEPENDENT_AMBULATORY_CARE_PROVIDER_SITE_OTHER): Payer: No Typology Code available for payment source | Admitting: Internal Medicine

## 2021-07-24 ENCOUNTER — Other Ambulatory Visit: Payer: Self-pay

## 2021-07-24 VITALS — BP 130/78 | HR 59 | Temp 98.8°F | Ht 72.0 in | Wt 134.1 lb

## 2021-07-24 DIAGNOSIS — Z23 Encounter for immunization: Secondary | ICD-10-CM

## 2021-07-24 DIAGNOSIS — J449 Chronic obstructive pulmonary disease, unspecified: Secondary | ICD-10-CM

## 2021-07-24 DIAGNOSIS — R634 Abnormal weight loss: Secondary | ICD-10-CM | POA: Diagnosis not present

## 2021-07-24 MED ORDER — BREZTRI AEROSPHERE 160-9-4.8 MCG/ACT IN AERO: 2.0000 | INHALATION_SPRAY | Freq: Two times a day (BID) | RESPIRATORY_TRACT | 0 refills | Status: DC

## 2021-07-24 MED ORDER — BREZTRI AEROSPHERE 160-9-4.8 MCG/ACT IN AERO: 2.0000 | INHALATION_SPRAY | Freq: Two times a day (BID) | RESPIRATORY_TRACT | 11 refills | Status: DC

## 2021-07-24 NOTE — Patient Instructions (Addendum)
Plan A = Automatic = Always=    Breztri Take 2 puffs first thing in am and then another 2 puffs about 12 hours later.    Work on inhaler technique:  relax and gently blow all the way out then take a nice smooth full deep breath back in, triggering the inhaler at same time you start breathing in.  Hold for up to 5 seconds if you can. Blow out thru nose. Rinse and gargle with water when done.  If mouth or throat bother you at all,  try brushing teeth/gums/tongue with arm and hammer toothpaste/ make a slurry and gargle and spit out.    Plan B = Backup (to supplement plan A, not to replace it) Only use your albuterol inhaler as a rescue medication to be used if you can't catch your breath by resting or doing a relaxed purse lip breathing pattern.  - The less you use it, the better it will work when you need it. - Ok to use the inhaler up to 2 puffs  every 4 hours if you must but call for appointment if use goes up over your usual need - Don't leave home without it !!  (think of it like the spare tire for your car)   Plan C = Crisis (instead of Plan B but only if Plan B stops working) - only use your albuterol nebulizer if you first try Plan B and it fails to help > ok to use the nebulizer up to every 4 hours but if start needing it regularly call for immediate appointment   Ok to try albuterol 15 min before an activity (on alternating days with the ventolin inhaler, then the nebulizer, then nothing )  that you know would usually make you short of breath and see if it makes any difference and if makes none then don't take albuterol after activity unless you can't catch your breath as this means it's the resting that helps, not the albuterol.  Please schedule a follow up visit in 6 months but call sooner if needed     - Late Add needs cxr at next ov if not done before then

## 2021-07-24 NOTE — Progress Notes (Signed)
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 Breathing is unchanged since the last visit. He is his albuterol inhaler once a day on average and has not used albuterol neb. He has some cough in the evenings- prod with clear sputum.   Dyspnea: walks dogs slow pace x 30-40 min / does not do shopping  Cough: minimal mucoid Sleep: flat bed s resp issues SABA use: rare  02 none   Rec Stop anoro and the budesonide nebulizer (because they are equivalent of Breztri)  Plan A = Automatic = Always=    Breztri Take 2 puffs first thing in am and then another 2 puffs about 12 hours later.  Work on inhaler technique:   Plan B = Backup (to supplement plan A, not to replace it) Only use your albuterol inhaler as a rescue medication Plan C = Crisis (instead of Plan B but only if Plan B stops working) - only use your albuterol nebulizer if you first try Plan B and it fails to help Try albuterol 15 min before an activity that you know would make you short of breath and see if it makes any difference and if makes none then don't take it after activity unless you can't catch your breath. Please schedule a follow up visit in 6 months but call sooner if needed  Add: rec booster with moderna or pfizer if he indeed received the J/J > 6 m ago     01/24/2021  f/u ov/[place] office/Jordain Radin re:  COPD IV  Chief Complaint  Patient presents with   Follow-up    Breztri only working for 4-5 hours and then he can tell it wears off bc he starts feeling  more SOB. He has been coughing with clear to grey sputum and also wheezing. He is using his albuterol inhaler 3-4 x per day. He rarely uses neb.    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  Work on inhaler technique:  Plan B = Backup (to supplement plan A, not to replace it) Only use your albuterol inhaler as a rescue medication Plan C = Crisis (instead of Plan B but only if Plan B stops working) - only use your albuterol nebulizer if you first try Plan B and it fails to help > ok to use the nebulizer up to every 4 hours but if start needing it regularly call for immediate appointment  07/24/2021  f/u ov/[facility]/Eliana Lueth re: GOLD 4  maint on breztri 2bid   Chief Complaint  Patient presents with   Follow-up    Wheezing and coughing up gray/clear mucus have not improved since last OV.   Dyspnea:  still walking 3 dogs daily  Cough: mostly p breztri in am x 30 min / mucoid  Sleeping: flat/ one pillow / no resp cc  SABA use: about the same  02: none  Covid status: vax x 2      No obvious day to day or daytime variability or assoc  purulent sputum or mucus plugs or hemoptysis or cp or chest tightness,   overt sinus or hb symptoms.   No nocturnal  or early am exacerbation  of respiratory  c/o's or need for noct saba. Also denies any obvious fluctuation of symptoms with weather or environmental changes or other aggravating or alleviating factors except as outlined above   No unusual exposure hx or h/o childhood pna/ asthma  or knowledge of premature birth.  Current Allergies, Complete Past Medical History, Past Surgical History, Family History, and Social History were reviewed in [facility] [employer] record.  ROS  The following are not active complaints unless bolded Hoarseness, sore throat, dysphagia, dental problems, itching, sneezing,  nasal congestion or discharge of excess mucus or purulent secretions, ear ache,   fever, chills, sweats, unintended wt loss or wt gain, classically pleuritic or exertional cp,  orthopnea pnd or arm/hand swelling  or leg swelling, presyncope, palpitations, abdominal pain, anorexia, nausea, vomiting, diarrhea  or change in bowel habits or change in bladder habits, change in stools or change in urine, dysuria, hematuria,  rash, arthralgias, visual complaints, headache, numbness, weakness or ataxia or problems with walking or coordination,  change in mood or  memory.        Current Meds  Medication Sig   albuterol (PROVENTIL) (2.5 MG/3ML) 0.083% nebulizer solution Take 3 mLs (2.5 mg total) by nebulization every 6 (six) hours as needed for wheezing or shortness of breath.   albuterol (VENTOLIN HFA) 108 (90 Base) MCG/ACT inhaler INHALE TWO PUFFS INTO THE LUNGS EVERY SIX HOURS AS NEEDED   Budeson-Glycopyrrol-Formoterol (BREZTRI AEROSPHERE) 160-9-4.8 MCG/ACT AERO Inhale 2 puffs into the lungs 2 (two) times daily.   DULoxetine (CYMBALTA) 30 MG capsule Take 60 mg by mouth daily.   HYDROcodone-acetaminophen (NORCO) 7.5-325 MG tablet Take 1 tablet by mouth bid prn pain.   traZODone (DESYREL) 100 MG tablet TAKE ONE TABLET BY MOUTH EVERY NIGHT AT BEDTIME                   Objective:       07/24/2021     134   01/24/21 143 lb 3.2 oz (65 kg)  10/24/20 153 lb (69.4 kg)  06/27/20 154 lb (69.9 kg)    Vital signs reviewed  07/24/2021  - Note at rest 02 sats  97% on RA   General appearance:    thin amb wm nad   HEENT : pt wearing mask not removed for exam due to covid -19  concerns.    NECK :  without JVD/Nodes/TM/ nl carotid upstrokes bilaterally   LUNGS: no acc muscle use,  Mod barrel  contour chest wall with bilateral  Distant bs s audible wheeze and  without cough on insp or exp maneuvers and mod  Hyperresonant  to  percussion bilaterally     CV:  RRR  no s3 or murmur or increase in P2,  and no edema   ABD:  soft and nontender with pos mid insp Hoover's  in the supine position. No bruits or organomegaly appreciated, bowel sounds nl  MS:     ext warm without deformities, calf tenderness, cyanosis or clubbing No obvious joint restrictions   SKIN: warm and dry without lesions    NEURO:  alert, approp, nl sensorium with  no motor or cerebellar deficits apparent.           Assessment

## 2021-07-25 ENCOUNTER — Encounter: Payer: Self-pay | Admitting: Internal Medicine

## 2021-07-25 ENCOUNTER — Telehealth: Payer: Self-pay

## 2021-07-25 ENCOUNTER — Other Ambulatory Visit: Payer: Self-pay

## 2021-07-25 DIAGNOSIS — R0609 Other forms of dyspnea: Secondary | ICD-10-CM

## 2021-07-25 DIAGNOSIS — R634 Abnormal weight loss: Secondary | ICD-10-CM | POA: Insufficient documentation

## 2021-07-25 NOTE — Telephone Encounter (Signed)
Called and spoke to patient. He states he does have a PCP but hasnt been in a while and would rather do the blood work and CXR here than call and make an appt with PCP. States he will come before the end of the week for labs and CXR  Would also like to get a flu shot when he comes in for labs. Dr. Melvyn Novas please advise if this is okay?

## 2021-07-25 NOTE — Assessment & Plan Note (Addendum)
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) - 06/27/2020  After extensive coaching inhaler device,  effectiveness =    75% (short Ti) > try 2 week sample of breztri instead of anoro/pulmicort - 07/24/2021  After extensive coaching inhaler device,  effectiveness =    75% hfa (still Ti short)    Group D in terms of symptom/risk and laba/lama/ICS  therefore appropriate rx at this point >>>  Continue breztri and approp saba  Re SABA :  I spent extra time with pt today reviewing appropriate use of albuterol for prn use on exertion with the following points: 1) saba is for relief of sob that does not improve by walking a slower pace or resting but rather if the pt does not improve after trying this first. 2) If the pt is convinced, as many are, that saba helps recover from activity faster then it's easy to tell if this is the case by re-challenging : ie stop, take the inhaler, then p 5 minutes try the exact same activity (intensity of workload) that just caused the symptoms and see if they are substantially diminished or not after saba 3) if there is an activity that reproducibly causes the symptoms, try the saba 15 min before the activity on alternate days   If in fact the saba really does help, then fine to continue to use it prn but advised may need to look closer at the maintenance regimen being used to achieve better control of airways disease with exertion.           Each maintenance medication was reviewed in detail including emphasizing most importantly the difference between maintenance and prns and under what circumstances the prns are to be triggered using an action plan format where appropriate.  Total time for H and P, chart review, counseling, reviewing hfa/neb device(s) and generating customized AVS unique to this office visit / same day charting = 25 min

## 2021-07-25 NOTE — Telephone Encounter (Signed)
-----   Message from Tanda Rockers, MD sent at 07/25/2021  7:42 AM EST ----- I didn't realize he's been losing so much wt - needs eval by PCP and if doesn't have one needs :  Cxr Tsh  Cbc with diff BMET   dx  doe

## 2021-07-25 NOTE — Assessment & Plan Note (Signed)
Advised w/u 07/24/2021 by PCP but if doesn't have one can do cxr and labs here.

## 2021-07-25 NOTE — Telephone Encounter (Signed)
Order placed for flu shot. Will give pt flu vaccine when he comes to office for lab work.

## 2021-07-25 NOTE — Telephone Encounter (Signed)
Ok for flu shot here

## 2021-07-27 ENCOUNTER — Other Ambulatory Visit: Payer: Self-pay

## 2021-07-27 ENCOUNTER — Ambulatory Visit (HOSPITAL_COMMUNITY)
Admission: RE | Admit: 2021-07-27 | Discharge: 2021-07-27 | Disposition: A | Discharge status: Home / Self Care | Payer: No Typology Code available for payment source | Source: Ambulatory Visit | Attending: Internal Medicine | Admitting: Internal Medicine

## 2021-07-27 DIAGNOSIS — J439 Emphysema, unspecified: Secondary | ICD-10-CM | POA: Diagnosis not present

## 2021-07-27 DIAGNOSIS — Z23 Encounter for immunization: Secondary | ICD-10-CM | POA: Diagnosis not present

## 2021-07-27 DIAGNOSIS — R0600 Dyspnea, unspecified: Secondary | ICD-10-CM | POA: Diagnosis not present

## 2021-07-27 DIAGNOSIS — R0609 Other forms of dyspnea: Secondary | ICD-10-CM | POA: Insufficient documentation

## 2021-07-28 LAB — TSH: TSH: 1.55 u[IU]/mL (ref 0.450–4.500)

## 2021-07-28 LAB — CBC WITH DIFFERENTIAL/PLATELET
Basophils Absolute: 0.1 10*3/uL (ref 0.0–0.2)
Basos: 1 %
EOS (ABSOLUTE): 0.2 10*3/uL (ref 0.0–0.4)
Eos: 5 %
Hematocrit: 37.9 % (ref 37.5–51.0)
Hemoglobin: 13.2 g/dL (ref 13.0–17.7)
Immature Grans (Abs): 0 10*3/uL (ref 0.0–0.1)
Immature Granulocytes: 1 %
Lymphocytes Absolute: 1 10*3/uL (ref 0.7–3.1)
Lymphs: 20 %
MCH: 31.1 pg (ref 26.6–33.0)
MCHC: 34.8 g/dL (ref 31.5–35.7)
MCV: 89 fL (ref 79–97)
Monocytes Absolute: 0.4 10*3/uL (ref 0.1–0.9)
Monocytes: 9 %
Neutrophils Absolute: 3.2 10*3/uL (ref 1.4–7.0)
Neutrophils: 64 %
Platelets: 222 10*3/uL (ref 150–450)
RBC: 4.25 x10E6/uL (ref 4.14–5.80)
RDW: 11.4 % — ABNORMAL LOW (ref 11.6–15.4)
WBC: 4.9 10*3/uL (ref 3.4–10.8)

## 2021-07-28 LAB — BASIC METABOLIC PANEL
BUN/Creatinine Ratio: 13 (ref 10–24)
BUN: 10 mg/dL (ref 8–27)
CO2: 26 mmol/L (ref 20–29)
Calcium: 9.3 mg/dL (ref 8.6–10.2)
Chloride: 98 mmol/L (ref 96–106)
Creatinine, Ser: 0.8 mg/dL (ref 0.76–1.27)
Glucose: 115 mg/dL — ABNORMAL HIGH (ref 70–99)
Potassium: 4.3 mmol/L (ref 3.5–5.2)
Sodium: 141 mmol/L (ref 134–144)
eGFR: 101 mL/min/{1.73_m2} (ref >59)

## 2021-08-08 ENCOUNTER — Other Ambulatory Visit: Payer: Self-pay

## 2021-08-08 DIAGNOSIS — J449 Chronic obstructive pulmonary disease, unspecified: Secondary | ICD-10-CM | POA: Diagnosis not present

## 2021-08-08 DIAGNOSIS — F5104 Psychophysiologic insomnia: Secondary | ICD-10-CM | POA: Diagnosis not present

## 2021-08-08 DIAGNOSIS — M19012 Primary osteoarthritis, left shoulder: Secondary | ICD-10-CM | POA: Diagnosis not present

## 2021-08-08 DIAGNOSIS — I10 Essential (primary) hypertension: Secondary | ICD-10-CM | POA: Diagnosis not present

## 2021-08-08 DIAGNOSIS — M5459 Other low back pain: Secondary | ICD-10-CM | POA: Diagnosis not present

## 2021-08-08 MED ORDER — BREZTRI AEROSPHERE 160-9-4.8 MCG/ACT IN AERO: 2.0000 | INHALATION_SPRAY | Freq: Two times a day (BID) | RESPIRATORY_TRACT | 11 refills | Status: DC

## 2021-08-08 NOTE — Progress Notes (Signed)
Patient came into office asking about status of [employer] patient assistance. Let him know we have not heard anything back yet form them but we will be in touch as soon as we hear of approval or denial. Patient wanted prescription sent to [facility] for breztri in meantime while waiting for approval or denial. RX sent. Gave patient a copy with fax confirmation of assistance forms that were sent over to [employer].

## 2021-08-20 ENCOUNTER — Telehealth: Payer: Self-pay | Admitting: *Deleted

## 2021-08-20 ENCOUNTER — Ambulatory Visit (INDEPENDENT_AMBULATORY_CARE_PROVIDER_SITE_OTHER): Payer: No Typology Code available for payment source | Admitting: Gastroenterology

## 2021-08-20 ENCOUNTER — Encounter: Payer: Self-pay | Admitting: *Deleted

## 2021-08-20 ENCOUNTER — Encounter: Payer: Self-pay | Admitting: Gastroenterology

## 2021-08-20 ENCOUNTER — Other Ambulatory Visit: Payer: Self-pay

## 2021-08-20 VITALS — BP 121/66 | HR 67 | Temp 97.1°F | Ht 72.0 in | Wt 138.8 lb

## 2021-08-20 DIAGNOSIS — K7400 Hepatic fibrosis, unspecified: Secondary | ICD-10-CM

## 2021-08-20 DIAGNOSIS — B1920 Unspecified viral hepatitis C without hepatic coma: Secondary | ICD-10-CM | POA: Diagnosis not present

## 2021-08-20 DIAGNOSIS — Z86010 Personal history of colonic polyps: Secondary | ICD-10-CM

## 2021-08-20 MED ORDER — PEG 3350-KCL-NA BICARB-NACL 420 G PO SOLR: ORAL | 0 refills | Status: DC

## 2021-08-20 NOTE — Progress Notes (Signed)
Primary Care Physician:  Coral Spikes, DO Primary Gastroenterologist:  Elon Alas. Abbey Chatters, DO   Chief Complaint  Patient presents with   Consult    TCS due    HPI:  Timothy Davis is a 61 y.o. male here to schedule surveillance colonoscopy.  He also has a history of COPD followed by pulmonology closely, history of hepatitis C treated previously by ID in 2013.  Initial elastography with fibrosis score was 4.  After treatment elastography with fibrosis score of F2 with some F3 felt to be at moderate risk of fibrosis.  Previously advised that he should be screened for hepatoma every 6 months based on guidelines.  Patient was unsure that he wanted to pursue screening ultrasounds.  Last colonoscopy in 11/2017, he had 3 tubular adenomas removed and advised for 3 year surveillance.  Last ultrasound in 2017.  From a GI standpoint patient states he is doing well.  His bowel movements are regular.  No blood in the stool or melena.  He denies any abdominal pain.  He feels like his appetite is adequate.  No heartburn, dysphagia, vomiting.  He reports that his lung disease is about the same over the past few years.  Denies need for oxygen at home.  Has not seen his PCP in 1 year.  His weight has slowly declined over the past couple of years, he is lost 30 pounds.  In the past 1 year he is down 15 pounds.  States he drinks a 12 pack on the weekends.  In the remote past he drank more heavily.   Current Outpatient Medications  Medication Sig Dispense Refill   albuterol (PROVENTIL) (2.5 MG/3ML) 0.083% nebulizer solution Take 3 mLs (2.5 mg total) by nebulization every 6 (six) hours as needed for wheezing or shortness of breath. 75 mL 12   albuterol (VENTOLIN HFA) 108 (90 Base) MCG/ACT inhaler INHALE TWO PUFFS INTO THE LUNGS EVERY SIX HOURS AS NEEDED 8.5 g 1   Budeson-Glycopyrrol-Formoterol (BREZTRI AEROSPHERE) 160-9-4.8 MCG/ACT AERO Inhale 2 puffs into the lungs 2 (two) times daily. 10.7 g 11   DULoxetine  (CYMBALTA) 30 MG capsule Take 60 mg by mouth daily.     HYDROcodone-acetaminophen (NORCO) 7.5-325 MG tablet Take 1 tablet by mouth bid prn pain. 60 tablet 0   traZODone (DESYREL) 100 MG tablet TAKE ONE TABLET BY MOUTH EVERY NIGHT AT BEDTIME 90 tablet 1   Budeson-Glycopyrrol-Formoterol (BREZTRI AEROSPHERE) 160-9-4.8 MCG/ACT AERO Inhale 2 puffs into the lungs in the morning and at bedtime. 10.7 g 11   No current facility-administered medications for this visit.    Allergies as of 08/20/2021   (No Known Allergies)    Past Medical History:  Diagnosis Date   Asthmatic bronchitis    Chronic back pain    COPD (chronic obstructive pulmonary disease) (HCC)    Hepatitis C antibody test positive    noticed trying to give blood, s/p treatment by ID, F4 on U/S pretreatment, F2/F3 fibrosis on U/S post treatment   Insomnia    Shortness of breath     Past Surgical History:  Procedure Laterality Date   COLONOSCOPY  04/14/2012   Fields-6 polyps-tubular adenoma x 3 and hyperplastic polyps x 2/internal hemorrhoids/mild diverticulosis in the sigmoid colon, 1CM TA rectum   COLONOSCOPY WITH PROPOFOL N/A 12/23/2017   Procedure: COLONOSCOPY WITH PROPOFOL;  Surgeon: Danie Binder, MD;  [facility];  Service: Endoscopy;  Laterality: N/A;  10:30am   POLYPECTOMY  12/23/2017   Procedure: POLYPECTOMY;  Surgeon: Danie Binder, MD;  Location: [facility];  Service: Endoscopy;;  colon    Family History  Problem Relation Age of Onset   COPD Mother    Heart attack Mother    Asthma Mother    Lung disease Brother    Colon cancer Neg Hx    Colon polyps Neg Hx     Social History   Socioeconomic History   Marital status: Married    Spouse name: Not on file   Number of children: 2   Years of education: Not on file   Highest education level: Not on file  Occupational History   Occupation: disabled    Employer: DISABLED  Tobacco Use   Smoking status: Former    Packs/day: 1.50    Years:  27.00    Pack years: 40.50    Types: Cigarettes    Start date: 12/16/1974    Quit date: 04/23/2002    Years since quitting: 19.3   Smokeless tobacco: Current    Types: Chew  Vaping Use   Vaping Use: Never used  Substance and Sexual Activity   Alcohol use: Yes    Comment: 12 pack on weekend   Drug use: No    Comment: Hx marijuana, intranasal drugs, crack, cocaine.  [employer] 5 yrs ago   Sexual activity: Yes    Partners: Female    Comment: monagamous  Other Topics Concern   Not on file  Social History Narrative   Lives w/ wife, 2sons      [facility] (08/05/16):   Originally from [place], [place]. Moved to [place] when he was 61 y.o. Since then he has lived in [place]. 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 No known asbestos exposure. Currently has dogs and a cat. No bird or mold exposure. Enjoys fishing.    Social Determinants of Health   Financial Resource Strain: Not on file  Food Insecurity: Not on file  Transportation Needs: Not on file  Physical Activity: Not on file  Stress: Not on file  Social Connections: Not on file  Intimate Partner Violence: Not on file      ROS:  General: Negative for anorexia,  fever, chills, fatigue, weakness.  See HPI Eyes: Negative for vision changes.  ENT: Negative for hoarseness, difficulty swallowing , nasal congestion. CV: Negative for chest pain, angina, palpitations, positive dyspnea on exertion, peripheral edema.  Respiratory: Negative for dyspnea at rest, positive dyspnea on exertion, cough, sputum, wheezing.  GI: See history of present illness. GU:  Negative for dysuria, hematuria, urinary incontinence, urinary frequency, nocturnal urination.  MS: Negative for joint pain, low back pain.  Derm: Negative for rash or itching.  Neuro: Negative for weakness, abnormal sensation, seizure, frequent headaches, memory loss, confusion.  Psych: Negative for anxiety,  depression, suicidal ideation, hallucinations.  Endo: Negative for unusual weight change.  Heme: Negative for bruising or bleeding. Allergy: Negative for rash or hives.    Physical Examination:  BP 121/66    Pulse 67    Temp (!) 97.1 F (36.2 C)    Ht 6' (1.829 m)    Wt 138 lb 12.8 oz (63 kg)    BMI 18.82 kg/m    General: Well-nourished, well-developed in no acute distress.  Head: Normocephalic, atraumatic.   Eyes: Conjunctiva pink, no icterus. Mouth: masked. Neck: Supple without thyromegaly, masses, or lymphadenopathy.  Lungs: Decreased airway movement bilaterally.  When lying, audible wheezing.   Heart: Regular rate and rhythm, no murmurs rubs or gallops.  Abdomen: Bowel sounds are normal, nontender, nondistended, no hepatosplenomegaly or masses, no abdominal bruits or    hernia , no rebound or guarding.   Rectal: not performed Extremities: No lower extremity edema. No clubbing or deformities.  Neuro: Alert and oriented x 4 , grossly normal neurologically.  Skin: Warm and dry, no rash or jaundice.   Psych: Alert and cooperative, normal mood and affect.  Labs: Lab Results  Component Value Date   CREATININE 0.80 07/27/2021   BUN 10 07/27/2021   NA 141 07/27/2021   K 4.3 07/27/2021   CL 98 07/27/2021   CO2 26 07/27/2021   Lab Results  Component Value Date   WBC 4.9 07/27/2021   HGB 13.2 07/27/2021   HCT 37.9 07/27/2021   MCV 89 07/27/2021   PLT 222 07/27/2021   Lab Results  Component Value Date   TSH 1.550 07/27/2021   Lab Results  Component Value Date   ALT 14 04/27/2020   AST 22 04/27/2020   ALKPHOS 42 07/29/2017   BILITOT 0.7 04/27/2020     Imaging Studies: DG Chest 2 View  Result Date: 07/27/2021 CLINICAL DATA:  61 year old male with dyspnea on exertion EXAM: CHEST - 2 VIEW COMPARISON:  08/12/2018 FINDINGS: Cardiomediastinal silhouette unchanged in size and contour. Stigmata of emphysema, with increased retrosternal airspace, flattened hemidiaphragms,  increased AP diameter, and hyperinflation on the AP view. Architectural distortion of the upper lungs with superimposed reticular opacities. The distribution and severity appears similar to the comparison plain film. No new confluent airspace disease. No pleural effusion or pneumothorax. No displaced fracture. 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 No definite evidence of superimposed acute cardiopulmonary disease. Electronically Signed   By: Corrie Mckusick D.O.   On: 07/27/2021 17:16     Assessment:  History of adenomatous colon polyps: Due for surveillance colonoscopy.  Weight loss: 30 pound weight loss in the past 2 years.  Weight loss has been gradual.  He denies any issues with appetite.  Reports there has been no mention concerned about pulmonary, has not seen his PCP in 1 year.  He had a chest x-ray in December showing emphysema and advanced scarring/fibrosis in the superior lungs.  Last chest CT was over 2 years ago.  Last liver imaging 5 years ago.  Due to degree of hepatic fibrosis and prior hepatitis C, he is at risk for hepatocellular carcinoma.  When we last saw him in 2019, he declined ultrasound at that time.  Discussed at length with patient today regarding risk for [facility], concern for weight loss.  Recommended abdominal ultrasound initially, patient declined.  He is willing to pursue colonoscopy.  If colonoscopy unremarkable, would consider CT imaging chest/abdomen/pelvis for weight loss if he is agreeable.  Hepatic fibrosis: Treated for hepatitis C as outlined above.  Pretreatment elastography was F4, posttreatment elastography with F2/F3.  Patient should be getting hepatoma screening every 6 months.  He is at risk of progression to cirrhosis given his ongoing alcohol use.  Patient declines ultrasound at this time.   Plan:  Colonoscopy with Dr. Abbey Chatters.  ASA 3.  I have discussed the risks, alternatives, benefits with regards to but not limited to the  risk of reaction to medication, bleeding, infection, perforation and the patient is agreeable to proceed. Written consent to be obtained. Encourage patient to follow-up with PCP. Patient declined abdominal ultrasound to screen for hepatoma.  Colonoscopy unremarkable, would recommend work-up for weight loss if patient agreeable.  Weight loss could be secondary to his liver disease but would be concerned about underlying malignancy.

## 2021-08-20 NOTE — H&P (View-Only) (Signed)
Primary Care Physician:  Coral Spikes, DO Primary Gastroenterologist:  Elon Alas. Abbey Chatters, DO   Chief Complaint  Patient presents with   Consult    TCS due    HPI:  Timothy Davis is a 61 y.o. male here to schedule surveillance colonoscopy.  He also has a history of COPD followed by pulmonology closely, history of hepatitis C treated previously by ID in 2013.  Initial elastography with fibrosis score was 4.  After treatment elastography with fibrosis score of F2 with some F3 felt to be at moderate risk of fibrosis.  Previously advised that he should be screened for hepatoma every 6 months based on guidelines.  Patient was unsure that he wanted to pursue screening ultrasounds.  Last colonoscopy in 11/2017, he had 3 tubular adenomas removed and advised for 3 year surveillance.  Last ultrasound in 2017.  From a GI standpoint patient states he is doing well.  His bowel movements are regular.  No blood in the stool or melena.  He denies any abdominal pain.  He feels like his appetite is adequate.  No heartburn, dysphagia, vomiting.  He reports that his lung disease is about the same over the past few years.  Denies need for oxygen at home.  Has not seen his PCP in 1 year.  His weight has slowly declined over the past couple of years, he is lost 30 pounds.  In the past 1 year he is down 15 pounds.  States he drinks a 12 pack on the weekends.  In the remote past he drank more heavily.   Current Outpatient Medications  Medication Sig Dispense Refill   albuterol (PROVENTIL) (2.5 MG/3ML) 0.083% nebulizer solution Take 3 mLs (2.5 mg total) by nebulization every 6 (six) hours as needed for wheezing or shortness of breath. 75 mL 12   albuterol (VENTOLIN HFA) 108 (90 Base) MCG/ACT inhaler INHALE TWO PUFFS INTO THE LUNGS EVERY SIX HOURS AS NEEDED 8.5 g 1   Budeson-Glycopyrrol-Formoterol (BREZTRI AEROSPHERE) 160-9-4.8 MCG/ACT AERO Inhale 2 puffs into the lungs 2 (two) times daily. 10.7 g 11   DULoxetine  (CYMBALTA) 30 MG capsule Take 60 mg by mouth daily.     HYDROcodone-acetaminophen (NORCO) 7.5-325 MG tablet Take 1 tablet by mouth bid prn pain. 60 tablet 0   traZODone (DESYREL) 100 MG tablet TAKE ONE TABLET BY MOUTH EVERY NIGHT AT BEDTIME 90 tablet 1   Budeson-Glycopyrrol-Formoterol (BREZTRI AEROSPHERE) 160-9-4.8 MCG/ACT AERO Inhale 2 puffs into the lungs in the morning and at bedtime. 10.7 g 11   No current facility-administered medications for this visit.    Allergies as of 08/20/2021   (No Known Allergies)    Past Medical History:  Diagnosis Date   Asthmatic bronchitis    Chronic back pain    COPD (chronic obstructive pulmonary disease) (HCC)    Hepatitis C antibody test positive    noticed trying to give blood, s/p treatment by ID, F4 on U/S pretreatment, F2/F3 fibrosis on U/S post treatment   Insomnia    Shortness of breath     Past Surgical History:  Procedure Laterality Date   COLONOSCOPY  04/14/2012   Fields-6 polyps-tubular adenoma x 3 and hyperplastic polyps x 2/internal hemorrhoids/mild diverticulosis in the sigmoid colon, 1CM TA rectum   COLONOSCOPY WITH PROPOFOL N/A 12/23/2017   Procedure: COLONOSCOPY WITH PROPOFOL;  Surgeon: Danie Binder, MD;  [facility];  Service: Endoscopy;  Laterality: N/A;  10:30am   POLYPECTOMY  12/23/2017   Procedure: POLYPECTOMY;  Surgeon: Danie Binder, MD;  Location: [facility];  Service: Endoscopy;;  colon    Family History  Problem Relation Age of Onset   COPD Mother    Heart attack Mother    Asthma Mother    Lung disease Brother    Colon cancer Neg Hx    Colon polyps Neg Hx     Social History   Socioeconomic History   Marital status: Married    Spouse name: Not on file   Number of children: 2   Years of education: Not on file   Highest education level: Not on file  Occupational History   Occupation: disabled    Employer: DISABLED  Tobacco Use   Smoking status: Former    Packs/day: 1.50    Years:  27.00    Pack years: 40.50    Types: Cigarettes    Start date: 12/16/1974    Quit date: 04/23/2002    Years since quitting: 19.3   Smokeless tobacco: Current    Types: Chew  Vaping Use   Vaping Use: Never used  Substance and Sexual Activity   Alcohol use: Yes    Comment: 12 pack on weekend   Drug use: No    Comment: Hx marijuana, intranasal drugs, crack, cocaine.  Timothy Davis 5 yrs ago   Sexual activity: Yes    Partners: Female    Comment: monagamous  Other Topics Concern   Not on file  Social History Narrative   Lives w/ wife, 2sons      [facility] (08/05/16):   Originally from [place], [place]. Moved to [place] when he was 61 y.o. Since then he has lived in [place]. 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 No known asbestos exposure. Currently has dogs and a cat. No bird or mold exposure. Enjoys fishing.    Social Determinants of Health   Financial Resource Strain: Not on file  Food Insecurity: Not on file  Transportation Needs: Not on file  Physical Activity: Not on file  Stress: Not on file  Social Connections: Not on file  Intimate Partner Violence: Not on file      ROS:  General: Negative for anorexia,  fever, chills, fatigue, weakness.  See HPI Eyes: Negative for vision changes.  ENT: Negative for hoarseness, difficulty swallowing , nasal congestion. CV: Negative for chest pain, angina, palpitations, positive dyspnea on exertion, peripheral edema.  Respiratory: Negative for dyspnea at rest, positive dyspnea on exertion, cough, sputum, wheezing.  GI: See history of present illness. GU:  Negative for dysuria, hematuria, urinary incontinence, urinary frequency, nocturnal urination.  MS: Negative for joint pain, low back pain.  Derm: Negative for rash or itching.  Neuro: Negative for weakness, abnormal sensation, seizure, frequent headaches, memory loss, confusion.  Psych: Negative for anxiety,  depression, suicidal ideation, hallucinations.  Endo: Negative for unusual weight change.  Heme: Negative for bruising or bleeding. Allergy: Negative for rash or hives.    Physical Examination:  BP 121/66    Pulse 67    Temp (!) 97.1 F (36.2 C)    Ht 6' (1.829 m)    Wt 138 lb 12.8 oz (63 kg)    BMI 18.82 kg/m    General: Well-nourished, well-developed in no acute distress.  Head: Normocephalic, atraumatic.   Eyes: Conjunctiva pink, no icterus. Mouth: masked. Neck: Supple without thyromegaly, masses, or lymphadenopathy.  Lungs: Decreased airway movement bilaterally.  When lying, audible wheezing.   Heart: Regular rate and rhythm, no murmurs rubs or gallops.  Abdomen: Bowel sounds are normal, nontender, nondistended, no hepatosplenomegaly or masses, no abdominal bruits or    hernia , no rebound or guarding.   Rectal: not performed Extremities: No lower extremity edema. No clubbing or deformities.  Neuro: Alert and oriented x 4 , grossly normal neurologically.  Skin: Warm and dry, no rash or jaundice.   Psych: Alert and cooperative, normal mood and affect.  Labs: Lab Results  Component Value Date   CREATININE 0.80 07/27/2021   BUN 10 07/27/2021   NA 141 07/27/2021   K 4.3 07/27/2021   CL 98 07/27/2021   CO2 26 07/27/2021   Lab Results  Component Value Date   WBC 4.9 07/27/2021   HGB 13.2 07/27/2021   HCT 37.9 07/27/2021   MCV 89 07/27/2021   PLT 222 07/27/2021   Lab Results  Component Value Date   TSH 1.550 07/27/2021   Lab Results  Component Value Date   ALT 14 04/27/2020   AST 22 04/27/2020   ALKPHOS 42 07/29/2017   BILITOT 0.7 04/27/2020     Imaging Studies: DG Chest 2 View  Result Date: 07/27/2021 CLINICAL DATA:  61 year old male with dyspnea on exertion EXAM: CHEST - 2 VIEW COMPARISON:  08/12/2018 FINDINGS: Cardiomediastinal silhouette unchanged in size and contour. Stigmata of emphysema, with increased retrosternal airspace, flattened hemidiaphragms,  increased AP diameter, and hyperinflation on the AP view. Architectural distortion of the upper lungs with superimposed reticular opacities. The distribution and severity appears similar to the comparison plain film. No new confluent airspace disease. No pleural effusion or pneumothorax. No displaced fracture. 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 No definite evidence of superimposed acute cardiopulmonary disease. Electronically Signed   By: Corrie Mckusick D.O.   On: 07/27/2021 17:16     Assessment:  History of adenomatous colon polyps: Due for surveillance colonoscopy.  Weight loss: 30 pound weight loss in the past 2 years.  Weight loss has been gradual.  He denies any issues with appetite.  Reports there has been no mention concerned about pulmonary, has not seen his PCP in 1 year.  He had a chest x-ray in December showing emphysema and advanced scarring/fibrosis in the superior lungs.  Last chest CT was over 2 years ago.  Last liver imaging 5 years ago.  Due to degree of hepatic fibrosis and prior hepatitis C, he is at risk for hepatocellular carcinoma.  When we last saw him in 2019, he declined ultrasound at that time.  Discussed at length with patient today regarding risk for [facility], concern for weight loss.  Recommended abdominal ultrasound initially, patient declined.  He is willing to pursue colonoscopy.  If colonoscopy unremarkable, would consider CT imaging chest/abdomen/pelvis for weight loss if he is agreeable.  Hepatic fibrosis: Treated for hepatitis C as outlined above.  Pretreatment elastography was F4, posttreatment elastography with F2/F3.  Patient should be getting hepatoma screening every 6 months.  He is at risk of progression to cirrhosis given his ongoing alcohol use.  Patient declines ultrasound at this time.   Plan:  Colonoscopy with Dr. Abbey Chatters.  ASA 3.  I have discussed the risks, alternatives, benefits with regards to but not limited to the  risk of reaction to medication, bleeding, infection, perforation and the patient is agreeable to proceed. Written consent to be obtained. Encourage patient to follow-up with PCP. Patient declined abdominal ultrasound to screen for hepatoma.  Colonoscopy unremarkable, would recommend work-up for weight loss if patient agreeable.  Weight loss could be secondary to his liver disease but would be concerned about underlying malignancy.

## 2021-08-20 NOTE — Patient Instructions (Signed)
Colonoscopy to be scheduled. See separate instructions. Please let me know if you decide you want to pursue liver ultrasound. You are at increase risk of Hepatocellular Carcinoma (liver cancer) due to prior Hepatitis C and degree of liver fibrosis ("stiffness").

## 2021-08-20 NOTE — Telephone Encounter (Signed)
Called pt and he is aware of pre-op appt details.  Checked evicore and no PA is required for TCS.

## 2021-08-21 ENCOUNTER — Encounter: Payer: Self-pay | Admitting: *Deleted

## 2021-08-21 ENCOUNTER — Telehealth: Payer: Self-pay | Admitting: Internal Medicine

## 2021-08-21 NOTE — Telephone Encounter (Signed)
Called pt and spoke with spouse. Patient needs to r/s procedure on for 2/9 with Dr. Abbey Chatters. He has been moved to 2/20 at 8:00am. Aware will mail new prep instructions and will send new pre-op appt. She voiced understanding.

## 2021-08-21 NOTE — Telephone Encounter (Signed)
[phone].  Please call patient wife, she needs to reschedule his procedure

## 2021-08-22 ENCOUNTER — Telehealth: Payer: Self-pay | Admitting: Pulmonary Disease

## 2021-08-22 ENCOUNTER — Other Ambulatory Visit: Payer: Self-pay

## 2021-08-22 MED ORDER — ALBUTEROL SULFATE HFA 108 (90 BASE) MCG/ACT IN AERS: INHALATION_SPRAY | RESPIRATORY_TRACT | 11 refills | Status: DC

## 2021-08-22 MED ORDER — ALBUTEROL SULFATE (2.5 MG/3ML) 0.083% IN NEBU: 2.5000 mg | INHALATION_SOLUTION | Freq: Four times a day (QID) | RESPIRATORY_TRACT | 12 refills | Status: DC | PRN

## 2021-08-22 NOTE — Telephone Encounter (Signed)
Albuterol inhaler refilled. Called and notified patient and he voiced understanding. Nothing further needed.

## 2021-09-04 ENCOUNTER — Other Ambulatory Visit (HOSPITAL_COMMUNITY): Payer: No Typology Code available for payment source

## 2021-09-07 ENCOUNTER — Other Ambulatory Visit: Payer: Self-pay

## 2021-09-07 MED ORDER — BREZTRI AEROSPHERE 160-9-4.8 MCG/ACT IN AERO: 2.0000 | INHALATION_SPRAY | Freq: Two times a day (BID) | RESPIRATORY_TRACT | 0 refills | Status: DC

## 2021-09-11 NOTE — Patient Instructions (Signed)
Timothy Davis  09/11/2021     @PREFPERIOPPHARMACY @   Your procedure is scheduled on  09/17/2021.   Report to [facility] at  [phone]  A.M.   Call this number if you have problems the morning of surgery:  [phone]   Remember:  Follow the diet and prep instructions given to you by the office.    Use your nebulizer and your inhalers before you come and bring your rescue inhaler with you.    Take these medicines the morning of surgery with A SIP OF WATER         cymbalta, norco (if needed).     Do not wear jewelry, make-up or nail polish.  Do not wear lotions, powders, or perfumes, or deodorant.  Do not shave 48 hours prior to surgery.  Men may shave face and neck.  Do not bring valuables to the hospital.  [facility] is not responsible for any belongings or valuables.  Contacts, dentures or bridgework may not be worn into surgery.  Leave your suitcase in the car.  After surgery it may be brought to your room.  For patients admitted to the hospital, discharge time will be determined by your treatment team.  Patients discharged the day of surgery will not be allowed to drive home and must have someone with them for 24 hours.    Special instructions:   DO NOT smoek tobacco or vape for 24 hours before your procedure.  Please read over the following fact sheets that you were given. Anesthesia Post-op Instructions and Care and Recovery After Surgery      Colonoscopy, Adult, Care After This sheet gives you information about how to care for yourself after your procedure. Your health care provider may also give you more specific instructions. If you have problems or questions, contact your health care provider. What can I expect after the procedure? After the procedure, it is common to have: A small amount of blood in your stool for 24 hours after the procedure. Some gas. Mild cramping or bloating of your abdomen. Follow these instructions at home: Eating and  drinking  Drink enough fluid to keep your urine pale yellow. Follow instructions from your health care provider about eating or drinking restrictions. Resume your normal diet as instructed by your health care provider. Avoid heavy or fried foods that are hard to digest. Activity Rest as told by your health care provider. Avoid sitting for a long time without moving. Get up to take short walks every 1-2 hours. This is important to improve blood flow and breathing. Ask for help if you feel weak or unsteady. Return to your normal activities as told by your health care provider. Ask your health care provider what activities are safe for you. Managing cramping and bloating  Try walking around when you have cramps or feel bloated. Apply heat to your abdomen as told by your health care provider. Use the heat source that your health care provider recommends, such as a moist heat pack or a heating pad. Place a towel between your skin and the heat source. Leave the heat on for 20-30 minutes. Remove the heat if your skin turns bright red. This is especially important if you are unable to feel pain, heat, or cold. You may have a greater risk of getting burned. General instructions If you were given a sedative during the procedure, it can affect you for several hours. Do not drive or operate machinery until  your health care provider says that it is safe. For the first 24 hours after the procedure: Do not sign important documents. Do not drink alcohol. Do your regular daily activities at a slower pace than normal. Eat soft foods that are easy to digest. Take over-the-counter and prescription medicines only as told by your health care provider. Keep all follow-up visits as told by your health care provider. This is important. Contact a health care provider if: You have blood in your stool 2-3 days after the procedure. Get help right away if you have: More than a small spotting of blood in your  stool. Large blood clots in your stool. Swelling of your abdomen. Nausea or vomiting. A fever. Increasing pain in your abdomen that is not relieved with medicine. Summary After the procedure, it is common to have a small amount of blood in your stool. You may also have mild cramping and bloating of your abdomen. If you were given a sedative during the procedure, it can affect you for several hours. Do not drive or operate machinery until your health care provider says that it is safe. Get help right away if you have a lot of blood in your stool, nausea or vomiting, a fever, or increased pain in your abdomen. This information is not intended to replace advice given to you by your health care provider. Make sure you discuss any questions you have with your health care provider. Document Revised: 05/21/2019 Document Reviewed: 02/08/2019 Elsevier Patient Education  [place] After This sheet gives you information about how to care for yourself after your procedure. Your health care provider may also give you more specific instructions. If you have problems or questions, contact your health care provider. What can I expect after the procedure? After the procedure, it is common to have: Tiredness. Forgetfulness about what happened after the procedure. Impaired judgment for important decisions. Nausea or vomiting. Some difficulty with balance. Follow these instructions at home: For the time period you were told by your health care provider:   Rest as needed. Do not participate in activities where you could fall or become injured. Do not drive or use machinery. Do not drink alcohol. Do not take sleeping pills or medicines that cause drowsiness. Do not make important decisions or sign legal documents. Do not take care of children on your own. Eating and drinking Follow the diet that is recommended by your health care provider. Drink enough fluid to  keep your urine pale yellow. If you vomit: Drink water, juice, or soup when you can drink without vomiting. Make sure you have little or no nausea before eating solid foods. General instructions Have a responsible adult stay with you for the time you are told. It is important to have someone help care for you until you are awake and alert. Take over-the-counter and prescription medicines only as told by your health care provider. If you have sleep apnea, surgery and certain medicines can increase your risk for breathing problems. Follow instructions from your health care provider about wearing your sleep device: Anytime you are sleeping, including during daytime naps. While taking prescription pain medicines, sleeping medicines, or medicines that make you drowsy. Avoid smoking. Keep all follow-up visits as told by your health care provider. This is important. Contact a health care provider if: You keep feeling nauseous or you keep vomiting. You feel light-headed. You are still sleepy or having trouble with balance after 24 hours. You develop a rash.  You have a fever. You have redness or swelling around the IV site. Get help right away if: You have trouble breathing. You have new-onset confusion at home. Summary For several hours after your procedure, you may feel tired. You may also be forgetful and have poor judgment. Have a responsible adult stay with you for the time you are told. It is important to have someone help care for you until you are awake and alert. Rest as told. Do not drive or operate machinery. Do not drink alcohol or take sleeping pills. Get help right away if you have trouble breathing, or if you suddenly become confused. This information is not intended to replace advice given to you by your health care provider. Make sure you discuss any questions you have with your health care provider. Document Revised: 03/30/2020 Document Reviewed: 06/17/2019 Elsevier Patient  Education  2022 [employer].

## 2021-09-12 ENCOUNTER — Encounter (HOSPITAL_COMMUNITY)
Admission: RE | Admit: 2021-09-12 | Discharge: 2021-09-12 | Disposition: A | Discharge status: Home / Self Care | Payer: No Typology Code available for payment source | Source: Ambulatory Visit | Attending: Internal Medicine | Admitting: Internal Medicine

## 2021-09-17 ENCOUNTER — Ambulatory Visit (HOSPITAL_COMMUNITY)
Admission: RE | Admit: 2021-09-17 | Discharge: 2021-09-17 | Disposition: A | Discharge status: Home / Self Care | Payer: No Typology Code available for payment source | Attending: Internal Medicine | Admitting: Internal Medicine

## 2021-09-17 ENCOUNTER — Encounter (HOSPITAL_COMMUNITY)
Admission: RE | Disposition: A | Discharge status: Home / Self Care | Payer: Self-pay | Source: Home / Self Care | Attending: Internal Medicine

## 2021-09-17 ENCOUNTER — Encounter (HOSPITAL_COMMUNITY): Payer: Self-pay

## 2021-09-17 ENCOUNTER — Ambulatory Visit (HOSPITAL_COMMUNITY): Payer: No Typology Code available for payment source | Admitting: Anesthesiology

## 2021-09-17 ENCOUNTER — Other Ambulatory Visit: Payer: Self-pay

## 2021-09-17 ENCOUNTER — Ambulatory Visit (HOSPITAL_BASED_OUTPATIENT_CLINIC_OR_DEPARTMENT_OTHER): Payer: No Typology Code available for payment source | Admitting: Anesthesiology

## 2021-09-17 DIAGNOSIS — Z1211 Encounter for screening for malignant neoplasm of colon: Secondary | ICD-10-CM | POA: Diagnosis not present

## 2021-09-17 DIAGNOSIS — Z86010 Personal history of colonic polyps: Secondary | ICD-10-CM | POA: Diagnosis not present

## 2021-09-17 DIAGNOSIS — J439 Emphysema, unspecified: Secondary | ICD-10-CM | POA: Diagnosis not present

## 2021-09-17 DIAGNOSIS — R634 Abnormal weight loss: Secondary | ICD-10-CM | POA: Diagnosis not present

## 2021-09-17 DIAGNOSIS — Z87891 Personal history of nicotine dependence: Secondary | ICD-10-CM | POA: Diagnosis not present

## 2021-09-17 DIAGNOSIS — Z09 Encounter for follow-up examination after completed treatment for conditions other than malignant neoplasm: Secondary | ICD-10-CM | POA: Diagnosis not present

## 2021-09-17 DIAGNOSIS — J449 Chronic obstructive pulmonary disease, unspecified: Secondary | ICD-10-CM | POA: Diagnosis not present

## 2021-09-17 DIAGNOSIS — K635 Polyp of colon: Secondary | ICD-10-CM

## 2021-09-17 DIAGNOSIS — D123 Benign neoplasm of transverse colon: Secondary | ICD-10-CM | POA: Diagnosis not present

## 2021-09-17 DIAGNOSIS — K648 Other hemorrhoids: Secondary | ICD-10-CM | POA: Insufficient documentation

## 2021-09-17 DIAGNOSIS — Z8619 Personal history of other infectious and parasitic diseases: Secondary | ICD-10-CM | POA: Insufficient documentation

## 2021-09-17 DIAGNOSIS — K7460 Unspecified cirrhosis of liver: Secondary | ICD-10-CM | POA: Diagnosis not present

## 2021-09-17 DIAGNOSIS — D122 Benign neoplasm of ascending colon: Secondary | ICD-10-CM | POA: Insufficient documentation

## 2021-09-17 DIAGNOSIS — Z681 Body mass index (BMI) 19 or less, adult: Secondary | ICD-10-CM | POA: Insufficient documentation

## 2021-09-17 HISTORY — PX: POLYPECTOMY: SHX5525

## 2021-09-17 HISTORY — PX: COLONOSCOPY WITH PROPOFOL: SHX5780

## 2021-09-17 SURGERY — COLONOSCOPY WITH PROPOFOL
Anesthesia: General

## 2021-09-17 MED ORDER — EPHEDRINE 5 MG/ML INJ
INTRAVENOUS | Status: AC
  Filled 2021-09-17: qty 5

## 2021-09-17 MED ORDER — LIDOCAINE HCL (CARDIAC) PF 100 MG/5ML IV SOSY
PREFILLED_SYRINGE | INTRAVENOUS | Status: DC | PRN
  Administered 2021-09-17: 50 mg via INTRAVENOUS

## 2021-09-17 MED ORDER — EPHEDRINE SULFATE (PRESSORS) 50 MG/ML IJ SOLN
INTRAMUSCULAR | Status: DC | PRN
  Administered 2021-09-17: 10 mg via INTRAVENOUS
  Administered 2021-09-17: 5 mg via INTRAVENOUS
  Administered 2021-09-17: 10 mg via INTRAVENOUS

## 2021-09-17 MED ORDER — LACTATED RINGERS IV SOLN: INTRAVENOUS | Status: DC

## 2021-09-17 MED ORDER — PROPOFOL 500 MG/50ML IV EMUL
INTRAVENOUS | Status: DC | PRN
  Administered 2021-09-17: 150 ug/kg/min via INTRAVENOUS

## 2021-09-17 MED ORDER — STERILE WATER FOR IRRIGATION IR SOLN
Status: DC | PRN
  Administered 2021-09-17: 120 mL

## 2021-09-17 MED ORDER — PROPOFOL 10 MG/ML IV BOLUS
INTRAVENOUS | Status: DC | PRN
  Administered 2021-09-17: 100 mg via INTRAVENOUS

## 2021-09-17 NOTE — Interval H&P Note (Signed)
History and Physical Interval Note:  09/17/2021 8:05 AM  Timothy Davis  has presented today for surgery, with the diagnosis of h/o colon polyps.  The various methods of treatment have been discussed with the patient and family. After consideration of risks, benefits and other options for treatment, the patient has consented to  Procedure(s) with comments: COLONOSCOPY WITH PROPOFOL (N/A) - 11:15am as a surgical intervention.  The patient's history has been reviewed, patient examined, no change in status, stable for surgery.  I have reviewed the patient's chart and labs.  Questions were answered to the patient's satisfaction.     Eloise Harman

## 2021-09-17 NOTE — Anesthesia Postprocedure Evaluation (Signed)
Anesthesia Post Note  Patient: [place]  Procedure(s) Performed: COLONOSCOPY WITH PROPOFOL POLYPECTOMY  Patient location during evaluation: Phase II Anesthesia Type: General Level of consciousness: awake and alert and oriented Pain management: pain level controlled Vital Signs Assessment: post-procedure vital signs reviewed and stable Respiratory status: spontaneous breathing, nonlabored ventilation and respiratory function stable Cardiovascular status: blood pressure returned to baseline and stable Postop Assessment: no apparent nausea or vomiting Anesthetic complications: no   No notable events documented.   Last Vitals:  Vitals:   09/17/21 0829 09/17/21 0840  BP: (!) 95/59 104/68  Pulse: 80 76  Resp: 16 18  Temp: 36.7 C   SpO2: 100% 100%    Last Pain:  Vitals:   09/17/21 0840  TempSrc:   PainSc: 0-No pain                 Klohe Lovering C Mayleen Borrero

## 2021-09-17 NOTE — Transfer of Care (Signed)
Immediate Anesthesia Transfer of Care Note  Patient: Timothy Davis  Procedure(s) Performed: COLONOSCOPY WITH PROPOFOL POLYPECTOMY  Patient Location: Short Stay  Anesthesia Type:General  Level of Consciousness: awake and alert   Airway & Oxygen Therapy: Patient Spontanous Breathing  Post-op Assessment: Report given to RN and Post -op Vital signs reviewed and stable  Post vital signs: Reviewed and stable  Last Vitals:  Vitals Value Taken Time  BP    Temp    Pulse    Resp    SpO2      Last Pain:  Vitals:   09/17/21 0811  TempSrc:   PainSc: 0-No pain         Complications: No notable events documented.

## 2021-09-17 NOTE — Op Note (Signed)
[facility] Patient Name: Timothy Davis Procedure Date: 09/17/2021 8:05 AM MRN: [id number] Date of Birth: 1961-01-28 Attending MD: Elon Alas. Abbey Chatters DO CSN: [id number] Age: 61 Admit Type: Outpatient Procedure:                Colonoscopy Indications:              Surveillance: Personal history of adenomatous                            polyps on last colonoscopy > 3 years ago Providers:                Elon Alas. Abbey Chatters, DO, Tammy Vaught, RN, Hughie Closs RN, RN, Caprice Kluver Referring MD:              Medicines:                See the Anesthesia note for documentation of the                            administered medications Complications:            No immediate complications. Estimated Blood Loss:     Estimated blood loss was minimal. Procedure:                Pre-Anesthesia Assessment:                           - The anesthesia plan was to use monitored                            anesthesia care (MAC).                           After obtaining informed consent, the colonoscope                            was passed under direct vision. Throughout the                            procedure, the patient's blood pressure, pulse, and                            oxygen saturations were monitored continuously. The                            PCF-HQ190L ([id number]) scope was introduced through                            the anus and advanced to the the cecum, identified                            by appendiceal orifice and ileocecal valve. The                            colonoscopy  was performed without difficulty. The                            patient tolerated the procedure well. The quality                            of the bowel preparation was evaluated using the                            BBPS [facility] Bowel Preparation Scale) with scores                            of: Right Colon = 3, Transverse Colon = 3 and Left                            Colon = 3 (entire  mucosa seen well with no residual                            staining, small fragments of stool or opaque                            liquid). The total BBPS score equals 9. Scope In: 8:14:25 AM Scope Out: 8:26:15 AM Scope Withdrawal Time: 0 hours 8 minutes 23 seconds  Total Procedure Duration: 0 hours 11 minutes 50 seconds  Findings:      The perianal and digital rectal examinations were normal.      Non-bleeding internal hemorrhoids were found during endoscopy.      Three sessile polyps were found in the transverse colon and ascending       colon. The polyps were 3 to 5 mm in size. These polyps were removed with       a cold snare. Resection and retrieval were complete.      The exam was otherwise without abnormality. Impression:               - Non-bleeding internal hemorrhoids.                           - Three 3 to 5 mm polyps in the transverse colon                            and in the ascending colon, removed with a cold                            snare. Resected and retrieved.                           - The examination was otherwise normal. Moderate Sedation:      Per Anesthesia Care Recommendation:           - Patient has a contact number available for                            emergencies. The signs and symptoms of potential  delayed complications were discussed with the                            patient. Return to normal activities tomorrow.                            Written discharge instructions were provided to the                            patient.                           - Resume previous diet.                           - Continue present medications.                           - Await pathology results.                           - Repeat colonoscopy in 5 years for surveillance.                           - Return to GI clinic PRN. Procedure Code(s):        --- Professional ---                           [phone], Colonoscopy, flexible; with  removal of                            tumor(s), polyp(s), or other lesion(s) by snare                            technique Diagnosis Code(s):        --- Professional ---                           Z86.010, Personal history of colonic polyps                           K63.5, Polyp of colon                           K64.8, Other hemorrhoids CPT copyright 2019 American Medical Association. All rights reserved. The codes documented in this report are preliminary and upon coder review may  be revised to meet current compliance requirements. Elon Alas. Abbey Chatters, DO [place] Abbey Chatters, DO 09/17/2021 8:29:23 AM This report has been signed electronically. Number of Addenda: 0

## 2021-09-17 NOTE — Anesthesia Preprocedure Evaluation (Signed)
Anesthesia Evaluation  Patient identified by MRN, date of birth, ID band Patient awake    Reviewed: Allergy & Precautions, NPO status , Patient's Chart, lab work & pertinent test results  Airway Mallampati: II  TM Distance: >3 FB Neck ROM: Full    Dental  (+) Dental Advisory Given   Pulmonary shortness of breath, asthma , COPD,  COPD inhaler, former smoker,    Pulmonary exam normal breath sounds clear to auscultation       Cardiovascular + DOE  Normal cardiovascular exam Rhythm:Regular Rate:Normal     Neuro/Psych negative neurological ROS  negative psych ROS   GI/Hepatic Bowel prep,(+) Cirrhosis       , Hepatitis -, C  Endo/Other  negative endocrine ROS  Renal/GU negative Renal ROS  negative genitourinary   Musculoskeletal negative musculoskeletal ROS (+)   Abdominal   Peds negative pediatric ROS (+)  Hematology negative hematology ROS (+)   Anesthesia Other Findings   Reproductive/Obstetrics negative OB ROS                             Anesthesia Physical Anesthesia Plan  ASA: 3  Anesthesia Plan: General   Post-op Pain Management: Minimal or no pain anticipated   Induction: Intravenous  PONV Risk Score and Plan: TIVA  Airway Management Planned: Nasal Cannula and Natural Airway  Additional Equipment:   Intra-op Plan:   Post-operative Plan:   Informed Consent: I have reviewed the patients History and Physical, chart, labs and discussed the procedure including the risks, benefits and alternatives for the proposed anesthesia with the patient or authorized representative who has indicated his/her understanding and acceptance.     Dental advisory given  Plan Discussed with: CRNA and Surgeon  Anesthesia Plan Comments:         Anesthesia Quick Evaluation

## 2021-09-17 NOTE — Discharge Instructions (Addendum)
°  Colonoscopy Discharge Instructions  Read the instructions outlined below and refer to this sheet in the next few weeks. These discharge instructions provide you with general information on caring for yourself after you leave the hospital. Your doctor may also give you specific instructions. While your treatment has been planned according to the most current medical practices available, unavoidable complications occasionally occur.   ACTIVITY You may resume your regular activity, but move at a slower pace for the next 24 hours.  Take frequent rest periods for the next 24 hours.  Walking will help get rid of the air and reduce the bloated feeling in your belly (abdomen).  No driving for 24 hours (because of the medicine (anesthesia) used during the test).   Do not sign any important legal documents or operate any machinery for 24 hours (because of the anesthesia used during the test).  NUTRITION Drink plenty of fluids.  You may resume your normal diet as instructed by your doctor.  Begin with a light meal and progress to your normal diet. Heavy or fried foods are harder to digest and may make you feel sick to your stomach (nauseated).  Avoid alcoholic beverages for 24 hours or as instructed.  MEDICATIONS You may resume your normal medications unless your doctor tells you otherwise.  WHAT YOU CAN EXPECT TODAY Some feelings of bloating in the abdomen.  Passage of more gas than usual.  Spotting of blood in your stool or on the toilet paper.  IF YOU HAD POLYPS REMOVED DURING THE COLONOSCOPY: No aspirin products for 7 days or as instructed.  No alcohol for 7 days or as instructed.  Eat a soft diet for the next 24 hours.  FINDING OUT THE RESULTS OF YOUR TEST Not all test results are available during your visit. If your test results are not back during the visit, make an appointment with your caregiver to find out the results. Do not assume everything is normal if you have not heard from your  caregiver or the medical facility. It is important for you to follow up on all of your test results.  SEEK IMMEDIATE MEDICAL ATTENTION IF: You have more than a spotting of blood in your stool.  Your belly is swollen (abdominal distention).  You are nauseated or vomiting.  You have a temperature over 101.  You have abdominal pain or discomfort that is severe or gets worse throughout the day.   Your colonoscopy revealed 3 polyp(s) which I removed successfully. Await pathology results, my office will contact you. I recommend repeating colonoscopy in 5 years for surveillance purposes.   Otherwise follow up with GI as needed.    I hope you have a great rest of your week!  Charles K. Carver, D.O. Gastroenterology and Hepatology [facility]  

## 2021-09-18 LAB — SURGICAL PATHOLOGY

## 2021-09-19 ENCOUNTER — Encounter (HOSPITAL_COMMUNITY): Payer: Self-pay | Admitting: Internal Medicine

## 2021-11-07 DIAGNOSIS — M545 Low back pain, unspecified: Secondary | ICD-10-CM | POA: Diagnosis not present

## 2021-11-07 DIAGNOSIS — M19022 Primary osteoarthritis, left elbow: Secondary | ICD-10-CM | POA: Diagnosis not present

## 2021-11-23 DIAGNOSIS — Z20822 Contact with and (suspected) exposure to covid-19: Secondary | ICD-10-CM | POA: Diagnosis not present

## 2021-11-28 DIAGNOSIS — Z20822 Contact with and (suspected) exposure to covid-19: Secondary | ICD-10-CM | POA: Diagnosis not present

## 2022-01-27 NOTE — Progress Notes (Signed)
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 Breathing is unchanged since the last visit. He is his albuterol inhaler once a day on average and has not used albuterol neb. He has some cough in the evenings- prod with clear sputum.   Dyspnea: walks dogs slow pace x 30-40 min / does not do shopping  Cough: minimal mucoid Sleep: flat bed s resp issues SABA use: rare  02 none   Rec Stop anoro and the budesonide nebulizer (because they are equivalent of Breztri)  Plan A = Automatic = Always=    Breztri Take 2 puffs first thing in am and then another 2 puffs about 12 hours later.  Work on inhaler technique:   Plan B = Backup (to supplement plan A, not to replace it) Only use your albuterol inhaler as a rescue medication Plan C = Crisis (instead of Plan B but only if Plan B stops working) - only use your albuterol nebulizer if you first try Plan B and it fails to help Try albuterol 15 min before an activity that you know would make you short of breath and see if it makes any difference and if makes none then don't take it after activity unless you can't catch your breath. Please schedule a follow up visit in 6 months but call sooner if needed  Add: rec booster with moderna or pfizer if he indeed received the J/J > 6 m ago     01/24/2021  f/u ov/Timothy Davis re:  COPD IV  Chief Complaint  Patient presents with   Follow-up    Breztri only working for 4-5 hours and then he can tell it wears off bc he starts feeling  more SOB. He has been coughing with clear to grey sputum and also wheezing. He is using his albuterol inhaler 3-4 x per day. He rarely uses neb.    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  Work on inhaler technique:  Plan B = Backup (to supplement plan A, not to replace it) Only use your albuterol inhaler as a rescue medication Plan C = Crisis (instead of Plan B but only if Plan B stops working) - only use your albuterol nebulizer if you first try Plan B and it fails to help > ok to use the nebulizer up to every 4 hours but if start needing it regularly call for immediate appointment  07/24/2021  f/u ov/Timothy Davis office/Timothy Davis re: GOLD 4  maint on breztri 2bid   Chief Complaint  Patient presents with   Follow-up    Wheezing and coughing up gray/clear mucus have not improved since last OV.   Dyspnea:  still walking 3 dogs daily  Cough: mostly p breztri in am x 30 min / mucoid  Sleeping: flat/ one pillow / no resp cc  SABA use: about the same  02: none  Covid status: vax x 2  Rec Plan A = Automatic = Always=    Breztri Take 2 puffs first thing in am and then another 2 puffs about 12 hours later.   Work on inhaler technique: Plan B = Backup (to supplement plan A, not to replace it) Only use your albuterol inhaler as a rescue medication  Plan C = Crisis (instead of Plan B but only if Plan B stops working) - only use your albuterol nebulizer if you first try Plan B and it fails to help  Ok to try albuterol 15 min before an  activity (on alternating days with the ventolin inhaler, then the nebulizer, then nothing )  that you know would usually make you short of breath Please schedule a follow up visit in 6 months but call sooner if needed        01/28/2022  f/u ov/Timothy Davis re: GOLD 4  maint on breztri  Chief Complaint  Patient presents with   Follow-up    Recent air quality has been bothering patient but otherwise, he has no concerns.   Dyspnea:  still walking dogs 3 x daily each time 30 min some hills but feels he's losing ground, much worse since [name] smoke exp  Cough: worse in am's / mucoid  Sleeping: flat bed/one pillow s cc  SABA use: using hfa 1st thing in am and "11 am"  (not the rec use)  02: none     No obvious day to day or daytime variability or assoc  purulent sputum or mucus plugs or hemoptysis or cp or chest tightness, subjective wheeze or overt sinus or hb symptoms.   Sleeping iok  without nocturnal  or early am exacerbation  of respiratory  c/o's or need for noct saba. Also denies any obvious fluctuation of symptoms with weather or environmental changes or other aggravating or alleviating factors except as outlined above   No unusual exposure hx or h/o childhood pna/ asthma or knowledge of premature birth.  Current Allergies, Complete Past Medical History, Past Surgical History, Family History, and Social History were reviewed in [employer] record.  ROS  The following are not active complaints unless bolded Hoarseness, sore throat, dysphagia, dental problems, itching, sneezing,  nasal congestion or discharge of excess mucus or purulent secretions, ear ache,   fever, chills, sweats, unintended wt loss or wt gain, classically pleuritic or exertional cp,  orthopnea pnd or arm/hand swelling  or leg swelling, presyncope, palpitations, abdominal pain, anorexia, nausea, vomiting, diarrhea  or change in bowel habits or change in bladder habits, change in stools or change in urine,  dysuria, hematuria,  rash, arthralgias, visual complaints, headache, numbness, weakness or ataxia or problems with walking or coordination,  change in mood or  memory.        Current Meds  Medication Sig   albuterol (PROVENTIL) (2.5 MG/3ML) 0.083% nebulizer solution Take 3 mLs (2.5 mg total) by nebulization every 6 (six) hours as needed for wheezing or shortness of breath.   albuterol (VENTOLIN HFA)  108 (90 Base) MCG/ACT inhaler INHALE TWO PUFFS INTO THE LUNGS EVERY SIX HOURS AS NEEDED   Budeson-Glycopyrrol-Formoterol (BREZTRI AEROSPHERE) 160-9-4.8 MCG/ACT AERO Inhale 2 puffs into the lungs in the morning and at bedtime.   DULoxetine (CYMBALTA) 30 MG capsule Take 60 mg by mouth daily.   HYDROcodone-acetaminophen (NORCO) 7.5-325 MG tablet Take 1 tablet by mouth bid prn pain.   traZODone (DESYREL) 100 MG tablet TAKE ONE TABLET BY MOUTH EVERY NIGHT AT BEDTIME            Objective:     01/28/2022         135  07/24/2021     134   01/24/21 143 lb 3.2 oz (65 kg)  10/24/20 153 lb (69.4 kg)  06/27/20 154 lb (69.9 kg)    Vital signs reviewed  01/28/2022  - Note at rest 02 sats  96% on RA   General appearance:    hoarse thin chronically ill wm nad    HEENT :  Oropharynx  clear   Nasal turbinates nl    NECK :  without JVD/Nodes/TM/ nl carotid upstrokes bilaterally   LUNGS: no acc muscle use,  Mod barrel  contour chest wall with bilateral  Distant bs s audible wheeze and  without cough on insp or exp maneuvers and mod  Hyperresonant  to  percussion bilaterally     CV:  RRR  no s3 or murmur or increase in P2, and no edema   ABD:  soft and nontender with pos mid insp Hoover's  in the supine position. No bruits or organomegaly appreciated, bowel sounds nl  MS:   Ext warm without deformities or   obvious joint restrictions , calf tenderness, cyanosis  -  Mild clubbing  SKIN: warm and dry without lesions    NEURO:  alert, approp, nl sensorium with  no motor or cerebellar deficits apparent.             Assessment

## 2022-01-28 ENCOUNTER — Ambulatory Visit (INDEPENDENT_AMBULATORY_CARE_PROVIDER_SITE_OTHER): Payer: No Typology Code available for payment source | Admitting: Internal Medicine

## 2022-01-28 ENCOUNTER — Encounter: Payer: Self-pay | Admitting: Internal Medicine

## 2022-01-28 DIAGNOSIS — J449 Chronic obstructive pulmonary disease, unspecified: Secondary | ICD-10-CM | POA: Diagnosis not present

## 2022-01-28 MED ORDER — PREDNISONE 10 MG PO TABS: ORAL_TABLET | ORAL | 0 refills | Status: DC

## 2022-01-28 NOTE — Patient Instructions (Signed)
Prednisone 10 mg take  4 each am x 2 days,   2 each am x 2 days,  1 each am x 2 days and stop    Plan A = Automatic = Always=    Breztri Take 2 puffs first thing in am and then another 2 puffs about 12 hours later.     Plan B = Backup (to supplement plan A, not to replace it) Only use your albuterol inhaler as a rescue medication to be used if you can't catch your breath by resting or doing a relaxed purse lip breathing pattern.  - The less you use it, the better it will work when you need it. - Ok to use the inhaler up to 2 puffs  every 4 hours if you must but call for appointment if use goes up over your usual need - Don't leave home without it !!  (think of it like the spare tire for your car)   Plan C = Crisis (instead of Plan B but only if Plan B stops working) - only use your albuterol nebulizer if you first try Plan B and it fails to help > ok to use the nebulizer up to every 4 hours but if start needing it regularly call for immediate appointment   Ok to try albuterol 15 min before an activity (on alternating days)  that you know would usually make you short of breath and see if it makes any difference and if makes none then don't take albuterol after activity unless you can't catch your breath as this means it's the resting that helps, not the albuterol.       Please schedule a follow up visit in 3 months but call sooner if needed

## 2022-01-28 NOTE — Assessment & Plan Note (Addendum)
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) - 06/27/2020  After extensive coaching inhaler device,  effectiveness =    75% (short Ti) > try 2 week sample of breztri instead of anoro/pulmicort - 01/28/2022  After extensive coaching inhaler device,  effectiveness =    80% (still a bit short on the Ti) > continue breztri and more approp saba  - 01/28/2022   Walked on RA   x  3  lap(s) =  approx 450  ft  @ mod pace, stopped due to end of study sob p 1st lap with lowest 02 sats 94%    Mild flare due to [place] smoke >  Pred x 6 days but no need to change main rx    Group D (now reclassified as E) in terms of symptom/risk and laba/lama/ICS  therefore appropriate rx at this point >>>  breztri and approp saba  Re SABA :  I spent extra time with pt today reviewing appropriate use of albuterol for prn use on exertion with the following points: 1) saba is for relief of sob that does not improve by walking a slower pace or resting but rather if the pt does not improve after trying this first. 2) If the pt is convinced, as many are, that saba helps recover from activity faster then it's easy to tell if this is the case by re-challenging : ie stop, take the inhaler, then p 5 minutes try the exact same activity (intensity of workload) that just caused the symptoms and see if they are substantially diminished or not after saba 3) if there is an activity that reproducibly causes the symptoms, try the saba 15 min before the activity on alternate days   If in fact the saba really does help, then fine to continue to use it prn but advised may need to look closer at the maintenance regimen being used to achieve better control of airways disease with exertion.   >>> f/u q 3 m, sooner prn   Each maintenance medication was reviewed in detail including emphasizing most importantly the difference between maintenance and prns and under what circumstances the prns are to be triggered using an action plan format  where appropriate.  Total time for H and P, chart review, counseling, reviewing hfa device(s) , directly observing portions of ambulatory 02 saturation study/ and generating customized AVS unique to this office visit / same day charting > 30 min for multiple  refractory respiratory  symptoms of uncertain etiology

## 2022-04-15 IMAGING — DX DG CHEST 2V
2 series · 2 of 2 positions shown · non-contrast
Comparison: 08/12/2018

CLINICAL DATA: 60-year-old male with dyspnea on exertion

EXAM:
CHEST - 2 VIEW

[chest pa]
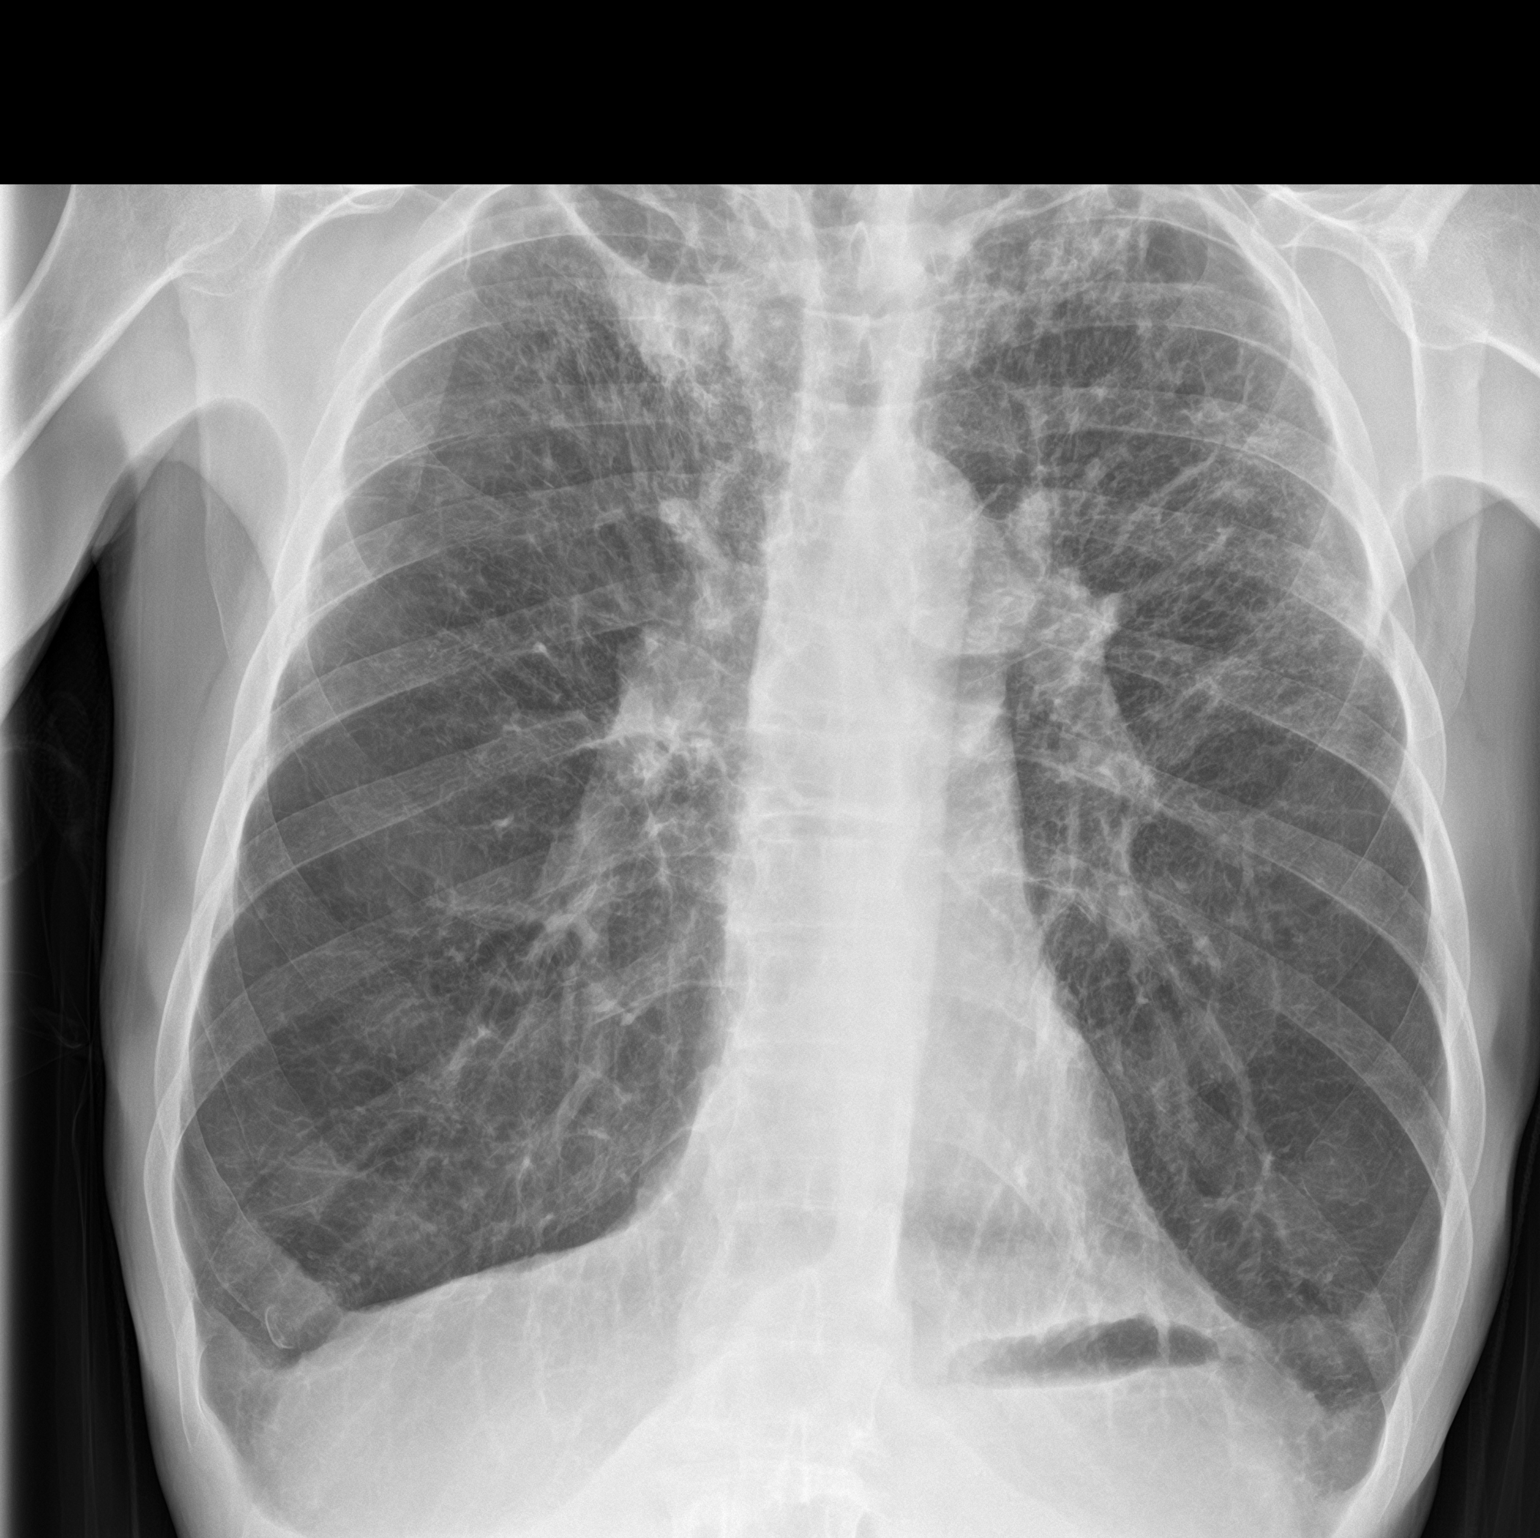

[chest lat]
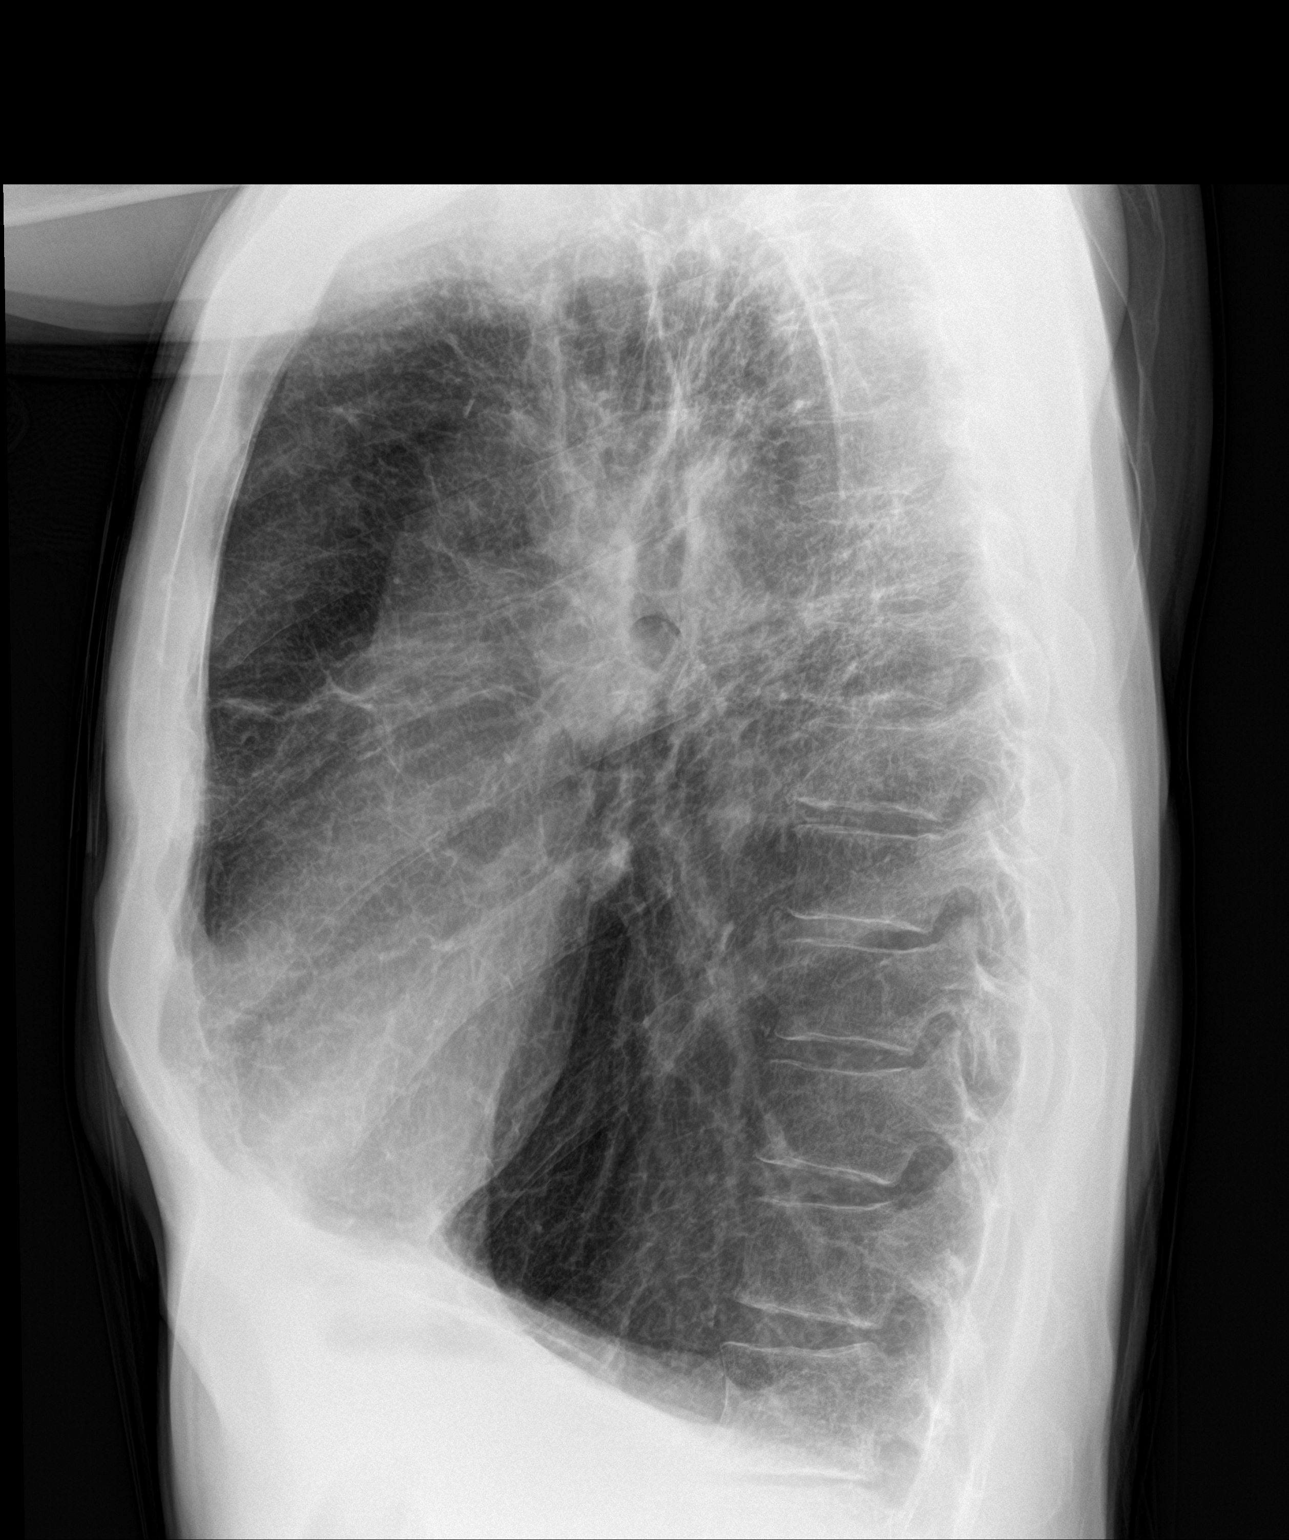

[2 of 2 positions shown; findings below may reference images not displayed]

FINDINGS: Cardiomediastinal silhouette unchanged in size and contour.

Stigmata of emphysema, with increased retrosternal airspace,
flattened hemidiaphragms, increased AP diameter, and hyperinflation
on the AP view.

Architectural distortion of the upper lungs with superimposed
reticular opacities. The distribution and severity appears similar
to the comparison plain film.

No new confluent airspace disease.

No pleural effusion or pneumothorax.

No displaced fracture.
IMPRESSION: Emphysema and advanced scarring/fibrosis in the superior lungs. No
definite evidence of superimposed acute cardiopulmonary disease.

## 2022-04-23 DIAGNOSIS — M545 Low back pain, unspecified: Secondary | ICD-10-CM | POA: Diagnosis not present

## 2022-04-23 DIAGNOSIS — M19022 Primary osteoarthritis, left elbow: Secondary | ICD-10-CM | POA: Diagnosis not present

## 2022-04-26 ENCOUNTER — Encounter: Payer: Self-pay | Admitting: Internal Medicine

## 2022-04-26 ENCOUNTER — Ambulatory Visit (INDEPENDENT_AMBULATORY_CARE_PROVIDER_SITE_OTHER): Payer: No Typology Code available for payment source | Admitting: Internal Medicine

## 2022-04-26 VITALS — BP 134/84 | HR 64 | Temp 98.5°F | Ht 72.0 in | Wt 139.2 lb

## 2022-04-26 DIAGNOSIS — Z23 Encounter for immunization: Secondary | ICD-10-CM | POA: Diagnosis not present

## 2022-04-26 DIAGNOSIS — R0609 Other forms of dyspnea: Secondary | ICD-10-CM | POA: Diagnosis not present

## 2022-04-26 DIAGNOSIS — J449 Chronic obstructive pulmonary disease, unspecified: Secondary | ICD-10-CM | POA: Diagnosis not present

## 2022-04-26 NOTE — Progress Notes (Unsigned)
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 Breathing is unchanged since the last visit. He is his albuterol inhaler once a day on average and has not used albuterol neb. He has some cough in the evenings- prod with clear sputum.   Dyspnea: walks dogs slow pace x 30-40 min / does not do shopping  Cough: minimal mucoid Sleep: flat bed s resp issues SABA use: rare  02 none   Rec Stop anoro and the budesonide nebulizer (because they are equivalent of Breztri)  Plan A = Automatic = Always=    Breztri Take 2 puffs first thing in am and then another 2 puffs about 12 hours later.  Work on inhaler technique:   Plan B = Backup (to supplement plan A, not to replace it) Only use your albuterol inhaler as a rescue medication Plan C = Crisis (instead of Plan B but only if Plan B stops working) - only use your albuterol nebulizer if you first try Plan B and it fails to help Try albuterol 15 min before an activity that you know would make you short of breath and see if it makes any difference and if makes none then don't take it after activity unless you can't catch your breath. Please schedule a follow up visit in 6 months but call sooner if needed  Add: rec booster with moderna or pfizer if he indeed received the J/J > 6 m ago     01/24/2021  f/u ov/[place] office/Timothy Davis re:  COPD IV  Chief Complaint  Patient presents with   Follow-up    Breztri only working for 4-5 hours and then he can tell it wears off bc he starts feeling  more SOB. He has been coughing with clear to grey sputum and also wheezing. He is using his albuterol inhaler 3-4 x per day. He rarely uses neb.    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  Work on inhaler technique:  Plan B = Backup (to supplement plan A, not to replace it) Only use your albuterol inhaler as a rescue medication Plan C = Crisis (instead of Plan B but only if Plan B stops working) - only use your albuterol nebulizer if you first try Plan B and it fails to help > ok to use the nebulizer up to every 4 hours but if start needing it regularly call for immediate appointment  01/28/2022  f/u ov/Timothy Davis re: GOLD 4  maint on breztri  Chief Complaint  Patient presents with   Follow-up    Recent air quality has been bothering patient but otherwise, he has no concerns.   Dyspnea:  still walking dogs 3 x daily each time 30 min some hills but feels he's losing ground, much worse since [name] smoke exp  Cough: worse in am's / mucoid  Sleeping: flat bed/one pillow s cc  SABA use: using hfa 1st thing in am and "11 am"  (not the rec use)  02: none   Rec Prednisone 10 mg take  4 each am x 2 days,   2 each am x 2 days,  1 each am x 2 days and stop  Plan A = Automatic = Always=    Breztri Take 2 puffs first thing in am and then another 2 puffs about 12 hours later.   Plan B = Backup (to supplement plan A, not to replace it) Only use your albuterol inhaler as a rescue medication Plan C = Crisis (instead of Plan B but only if Plan  B stops working) - only use your albuterol nebulizer if you first try [place] to try albuterol 15 min before an activity (on alternating days)  that you know would usually make you short of breath        04/26/2022  f/u ov/[place] office/Timothy Davis re: GOLD 4 maint on breztri 2bid   Chief Complaint  Patient presents with   Follow-up    Feels breathing is not doing well since lov  Dyspnea:  walking dogs an hour after breztri /still feels needs saba neb daily before walking  Cough: some in ams x  30 min/ no better  Sleeping: flat bed /one pillow settles down p 10 min  SABA use: using neb before seems to help but then back slows him  02: none       No obvious day to day or daytime variability or assoc excess/ purulent sputum or mucus plugs or hemoptysis or cp or chest tightness, subjective wheeze or overt sinus or hb symptoms.   Sleeping   without nocturnal  exacerbation  of respiratory  c/o's or need for noct saba. Also denies any obvious fluctuation of symptoms with weather or environmental changes or other aggravating or alleviating factors except as outlined above   No unusual exposure hx or h/o childhood pna/ asthma or knowledge of premature birth.  Current Allergies, Complete Past Medical History, Past Surgical History, Family History, and Social History were reviewed in [employer] record.  ROS  The following are not active complaints unless bolded Hoarseness, sore throat, dysphagia, dental problems, itching, sneezing,  nasal congestion or discharge of excess mucus or purulent secretions, ear ache,   fever, chills, sweats, unintended wt loss or wt gain, classically pleuritic or exertional cp,  orthopnea pnd or arm/hand swelling  or leg swelling, presyncope, palpitations, abdominal pain, anorexia, nausea, vomiting, diarrhea  or change in bowel habits or change in bladder habits, change in stools or change in urine, dysuria, hematuria,  rash, arthralgias/back pain  , visual complaints, headache, numbness, weakness or ataxia or problems with walking or coordination,  change in mood or  memory.        Current Meds  Medication Sig   albuterol (PROVENTIL) (2.5 MG/3ML) 0.083% nebulizer solution Take 3 mLs (2.5 mg total) by nebulization every 6 (six) hours as needed for wheezing or shortness of breath.  albuterol (VENTOLIN HFA) 108 (90 Base) MCG/ACT inhaler INHALE TWO PUFFS INTO THE LUNGS EVERY SIX HOURS AS NEEDED   Budeson-Glycopyrrol-Formoterol (BREZTRI AEROSPHERE) 160-9-4.8 MCG/ACT AERO Inhale 2 puffs into the lungs in the morning and at bedtime.   DULoxetine (CYMBALTA) 30 MG capsule Take 60 mg by mouth daily.   HYDROcodone-acetaminophen (NORCO) 7.5-325 MG tablet Take 1 tablet by mouth bid prn pain.   traZODone (DESYREL) 100 MG tablet TAKE ONE TABLET BY MOUTH EVERY NIGHT AT BEDTIME          Objective:    Wts  04/26/2022       139   01/28/2022         135  07/24/2021     134   01/24/21 143 lb 3.2 oz (65 kg)  10/24/20 153 lb (69.4 kg)  06/27/20 154 lb (69.9 kg)    Vital signs reviewed  04/26/2022  - Note at rest 02 sats  96% on RA   General appearance:    somber wm somewhat hopeless affect   HEENT :  Oropharynx  clear   Nasal turbinates nl    NECK :  without JVD/Nodes/TM/ nl carotid upstrokes bilaterally   LUNGS: no acc muscle use,  Mod barrel  contour chest wall with bilateral  Distant bs s audible wheeze and  without cough on insp or exp maneuvers and mod  Hyperresonant  to  percussion bilaterally     CV:  RRR  no s3 or murmur or increase in P2, and no edema   ABD:  soft and nontender with pos mid insp Hoover's  in the supine position. No bruits or organomegaly appreciated, bowel sounds nl  MS:   Ext warm without deformities or   obvious joint restrictions , calf tenderness, cyanosis -  Mild clubbing   SKIN: warm and dry without lesions    NEURO:  alert, approp, nl sensorium with  no motor or cerebellar deficits apparent.           04/26/2022 rec cxr did not go as requested      Labs ordered/ reviewed:      Chemistry      Component Value Date/Time   NA 138 04/26/2022 1049   K 4.6 04/26/2022 1049   CL 99 04/26/2022 1049   CO2 27 04/26/2022 1049   BUN 19 04/26/2022 1049   CREATININE 0.81 04/26/2022 1049   CREATININE 0.88 04/27/2020 1043      Component Value Date/Time   CALCIUM 9.7 04/26/2022 1049   ALKPHOS 42 07/29/2017 0717   AST 22 04/27/2020 1043   ALT 14 04/27/2020 1043   ALT 186 (H) 08/25/2013 0913   BILITOT 0.7 04/27/2020 1043   BILITOT 0.5 12/23/2014 0839   BILITOT 0.4 08/25/2013 0913        Lab Results  Component Value Date   WBC 4.3 04/26/2022   HGB 14.0 04/26/2022   HCT 41.5 04/26/2022   MCV 91 04/26/2022   PLT 175 04/26/2022       EOS                                                               0.2  04/26/2022      Lab Results  Component Value Date   TSH 2.380 04/26/2022     BNP     04/26/2022     13.2    No results found for: "ESRSEDRATE"        Assessment

## 2022-04-26 NOTE — Patient Instructions (Addendum)
No change in medications   Please remember to go to the lab department   for your tests - we will call you with the results when they are available.      Please remember to go to the  x-ray department  @  [facility] for your tests - we will call you with the results when they are available     Please schedule a follow up visit in 6 0 months but call sooner if needed

## 2022-04-27 LAB — BASIC METABOLIC PANEL
BUN/Creatinine Ratio: 23 (ref 10–24)
BUN: 19 mg/dL (ref 8–27)
CO2: 27 mmol/L (ref 20–29)
Calcium: 9.7 mg/dL (ref 8.6–10.2)
Chloride: 99 mmol/L (ref 96–106)
Creatinine, Ser: 0.81 mg/dL (ref 0.76–1.27)
Glucose: 85 mg/dL (ref 70–99)
Potassium: 4.6 mmol/L (ref 3.5–5.2)
Sodium: 138 mmol/L (ref 134–144)
eGFR: 100 mL/min/{1.73_m2} (ref >59)

## 2022-04-27 LAB — CBC WITH DIFFERENTIAL/PLATELET
Basophils Absolute: 0.1 10*3/uL (ref 0.0–0.2)
Basos: 1 %
EOS (ABSOLUTE): 0.2 10*3/uL (ref 0.0–0.4)
Eos: 4 %
Hematocrit: 41.5 % (ref 37.5–51.0)
Hemoglobin: 14 g/dL (ref 13.0–17.7)
Immature Grans (Abs): 0 10*3/uL (ref 0.0–0.1)
Immature Granulocytes: 1 %
Lymphocytes Absolute: 1.2 10*3/uL (ref 0.7–3.1)
Lymphs: 29 %
MCH: 30.7 pg (ref 26.6–33.0)
MCHC: 33.7 g/dL (ref 31.5–35.7)
MCV: 91 fL (ref 79–97)
Monocytes Absolute: 0.5 10*3/uL (ref 0.1–0.9)
Monocytes: 11 %
Neutrophils Absolute: 2.3 10*3/uL (ref 1.4–7.0)
Neutrophils: 54 %
Platelets: 175 10*3/uL (ref 150–450)
RBC: 4.56 x10E6/uL (ref 4.14–5.80)
RDW: 12.1 % (ref 11.6–15.4)
WBC: 4.3 10*3/uL (ref 3.4–10.8)

## 2022-04-27 LAB — BRAIN NATRIURETIC PEPTIDE: BNP: 13.2 pg/mL (ref 0.0–100.0)

## 2022-04-27 LAB — TSH: TSH: 2.38 u[IU]/mL (ref 0.450–4.500)

## 2022-04-27 NOTE — Assessment & Plan Note (Signed)
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) - 06/27/2020  After extensive coaching inhaler device,  effectiveness =    75% (short Ti) > try 2 week sample of breztri instead of anoro/pulmicort - 01/28/2022  After extensive coaching inhaler device,  effectiveness =    80% (still a bit short on the Ti) > continue breztri and more approp saba  - 01/28/2022   Walked on RA   x  3  lap(s) =  approx 450  ft  @ mod pace, stopped due to end of study sob p 1st lap with lowest 02 sats 94% - 04/26/2022   Walked on RA  x  3  lap(s) =  approx 450  ft  @   mod pace, stopped due to end of study  with lowest 02 sats 92% and sob on 2nd /3rd laps      Group D (now reclassified as E) in terms of symptom/risk and laba/lama/ICS  therefore appropriate rx at this point >>>  breztri 2 bid and approp saba  Re SABA :  I spent extra time with pt today reviewing appropriate use of albuterol for prn use on exertion with the following points: 1) saba is for relief of sob that does not improve by walking a slower pace or resting but rather if the pt does not improve after trying this first. 2) If the pt is convinced, as many are, that saba helps recover from activity faster then it's easy to tell if this is the case by re-challenging : ie stop, take the inhaler, then p 5 minutes try the exact same activity (intensity of workload) that just caused the symptoms and see if they are substantially diminished or not after saba 3) if there is an activity that reproducibly causes the symptoms, try the saba 15 min before the activity on alternate days   If in fact the saba really does help, then fine to continue to use it prn but advised may need to look closer at the maintenance regimen being used to achieve better control of airways disease with exertion.

## 2022-04-28 ENCOUNTER — Encounter: Payer: Self-pay | Admitting: Internal Medicine

## 2022-04-28 NOTE — Assessment & Plan Note (Addendum)
No evidence of CHF/ anemia/ thyroid dz/ allergy on lab 04/26/2022   Strongly suspect a component of depression and deconditioning exac by chronic back pain limitations   Needs to go to lab to complete the preliminary w/u   Each maintenance medication was reviewed in detail including emphasizing most importantly the difference between maintenance and prns and under what circumstances the prns are to be triggered using an action plan format where appropriate.  Total time for H and P, chart review, counseling, reviewing hfa/neb device(s) , directly observing portions of ambulatory 02 saturation study/ and generating customized AVS unique to this office visit / same day charting > 30 min

## 2022-05-27 ENCOUNTER — Ambulatory Visit (INDEPENDENT_AMBULATORY_CARE_PROVIDER_SITE_OTHER): Payer: No Typology Code available for payment source

## 2022-05-27 ENCOUNTER — Ambulatory Visit
Admission: EM | Admit: 2022-05-27 | Discharge: 2022-05-27 | Disposition: A | Discharge status: Home / Self Care | Payer: No Typology Code available for payment source | Attending: Family Medicine | Admitting: Family Medicine

## 2022-05-27 DIAGNOSIS — R0781 Pleurodynia: Secondary | ICD-10-CM | POA: Diagnosis not present

## 2022-05-27 DIAGNOSIS — R0782 Intercostal pain: Secondary | ICD-10-CM

## 2022-05-27 MED ORDER — LIDOCAINE 5 % EX PTCH
1.0000 | MEDICATED_PATCH | CUTANEOUS | 0 refills | Status: DC
Start: 2022-05-27 — End: 2023-05-06

## 2022-05-27 MED ORDER — CYCLOBENZAPRINE HCL 10 MG PO TABS: 10.0000 mg | ORAL_TABLET | Freq: Three times a day (TID) | ORAL | 0 refills | Status: DC | PRN

## 2022-05-27 NOTE — ED Provider Notes (Signed)
[facility]       CSN: [id number] Arrival date & time: 05/27/22  1657       History              Chief Complaint No chief complaint on file.     HPI Timothy Davis is a 61 y.o. male.    Patient presenting today with a week or so of left lateral rib pain after he twisted the wrong way while doing the laundry and landed up against the laundry machine.  States he can hear and feel something popping when he moves in this area.  Some pain with deep breath but no difficulty breathing, wheezing, dizziness, bruising or swelling to the area.  Has been taking his typically prescribed Norco that he takes for chronic back pain.           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/27/2020   Healthcare maintenance 05/09/2020   Hyperlipidemia 04/26/2020   Abnormal CT of the chest 09/06/2019   Smokeless tobacco use 08/02/2019   Former smoker 08/02/2019   Medication management 08/02/2019   History of COVID-19 08/02/2019   Polyp of rectum     Hepatic fibrosis 10/24/2017   Hx of adenomatous colonic polyps 10/24/2017   Altered mental status 07/29/2017   Pulmonary emphysema ([place]) 08/05/2016   Hepatic cirrhosis ([place]) 05/05/2014   Unspecified gastritis and gastroduodenitis without mention of hemorrhage 10/11/2013   Chronic back pain 01/12/2013   Insomnia 01/12/2013   Hepatitis C virus infection without hepatic coma 04/23/2012   Tubular adenoma of colon 04/23/2012   COPD  GOLD IV  07/06/2007   DOE (dyspnea on exertion) 07/06/2007           Past Surgical History:   Procedure Laterality Date   COLONOSCOPY   04/14/2012    Janee Morn polyps-tubular adenoma x 3 and hyperplastic polyps x 2/internal hemorrhoids/mild diverticulosis in the sigmoid colon, 1CM TA rectum   COLONOSCOPY WITH PROPOFOL N/A 12/23/2017    Procedure: COLONOSCOPY WITH PROPOFOL;  Surgeon: Danie Binder, MD;  [facility];  Service: Endoscopy;  Laterality: N/A;  10:30am   COLONOSCOPY WITH PROPOFOL N/A 09/17/2021    Procedure: COLONOSCOPY WITH PROPOFOL;  Surgeon: Eloise Harman, DO;  Location: [facility];  Service: Endoscopy;  Laterality: N/A;  11:15am   POLYPECTOMY   12/23/2017    Procedure: POLYPECTOMY;  Surgeon: Danie Binder, MD;  Location: [facility];  Service: Endoscopy;;  colon   POLYPECTOMY   09/17/2021    Procedure: POLYPECTOMY;  Surgeon: Eloise Harman, DO;  Location: [facility];  Service: Endoscopy;;            Home Medications                       Prior to Admission medications   Medication Sig Start Date End Date Taking? Authorizing Provider  albuterol (PROVENTIL) (2.5 MG/3ML)  0.083% nebulizer solution Take 3 mLs (2.5 mg total) by nebulization every 6 (six) hours as needed for wheezing or shortness of breath. 08/22/21     Tanda Rockers, MD  albuterol (VENTOLIN HFA) 108 (90 Base) MCG/ACT inhaler INHALE TWO PUFFS INTO THE LUNGS EVERY SIX HOURS AS NEEDED 08/22/21     Tanda Rockers, MD  Budeson-Glycopyrrol-Formoterol (BREZTRI AEROSPHERE) 160-9-4.8 MCG/ACT AERO Inhale 2 puffs into the lungs in the morning and at bedtime. 08/08/21     Tanda Rockers, MD  DULoxetine (CYMBALTA) 30 MG capsule Take 60 mg by mouth daily. 04/18/20     [provider]  HYDROcodone-acetaminophen (NORCO) 7.5-325 MG tablet Take 1 tablet by mouth bid prn pain. 03/14/20     Elvia Collum M, DO  traZODone (DESYREL) 100 MG tablet TAKE ONE TABLET BY MOUTH EVERY NIGHT AT BEDTIME 03/21/20     Erven Colla, DO      Family History      Family History  Problem Relation Age  of Onset   COPD Mother     Heart attack Mother     Asthma Mother     Lung disease Brother     Colon cancer Neg Hx     Colon polyps Neg Hx        Social History Social History         Tobacco Use   Smoking status: Former      Packs/day: 1.50      Years: 27.00      Total pack years: 40.50      Types: Cigarettes      Start date: 12/16/1974      Quit date: 04/23/2002      Years since quitting: 20.1   Smokeless tobacco: Current      Types: Chew  Vaping Use   Vaping Use: Never used  Substance Use Topics   Alcohol use: Yes      Comment: 12 pack on weekend   Drug use: No      Comment: Hx marijuana, intranasal drugs, crack, cocaine.  QUIT 5 yrs ago        Allergies              Patient has no known allergies.     Review of Systems Review of Systems Per HPI   Physical Exam Triage Vital Signs     ED Triage Vitals  Enc Vitals Group     BP 05/27/22 1705 (!) 161/76     Pulse Rate 05/27/22 1705 81     Resp 05/27/22 1705 20     Temp 05/27/22 1705 98.1 F (36.7 C)     Temp Source 05/27/22 1705 Oral     SpO2 05/27/22 1705 92 %     Weight --       Height --       Head Circumference --       Peak Flow --       Pain Score 05/27/22 1708 9     Pain Loc --       Pain Edu? --       Excl. in [place]? --      No data found.   Updated Vital Signs BP (!) 161/76 (BP Location: Right Arm)   Pulse 81   Temp 98.1 F (36.7 C) (Oral)   Resp 20   SpO2 92%    Visual Acuity Right Eye Distance:      Left Eye Distance:  Bilateral Distance:           Right Eye Near:            Left Eye Near:               Bilateral Near:                 Physical Exam Vitals and nursing note reviewed.  Constitutional:      Appearance: Normal appearance.  HENT:     Head: Atraumatic.  Eyes:     Extraocular Movements: Extraocular movements intact.     Conjunctiva/sclera: Conjunctivae normal.  Cardiovascular:     Rate and Rhythm: Normal rate and regular rhythm.  Pulmonary:     Effort:  Pulmonary effort is normal. No respiratory distress.     Breath sounds: Normal breath sounds. No wheezing or rales.     Comments: Chest rise symmetric bilaterally Musculoskeletal:        General: Tenderness and signs of injury present. No swelling or deformity. Normal range of motion.     Cervical back: Normal range of motion and neck supple.     Comments: Area of tenderness to palpation left lateral rib region, no bony deformity palpable and no bruising or swelling noted  Skin:    General: Skin is warm and dry.  Neurological:     General: No focal deficit present.     Mental Status: He is oriented to person, place, and time.  Psychiatric:        Mood and Affect: Mood normal.        Thought Content: Thought content normal.        Judgment: Judgment normal.          UC Treatments / Results  Labs (all labs ordered are listed, but only abnormal results are displayed) Labs Reviewed - No data to display   EKG     Radiology Imaging Results (Last 48 hours)  No results found.     Procedures Procedures (including critical care time)   Medications Ordered in UC Medications - No data to display   Initial Impression / Assessment and Plan / UC Course  I have reviewed the triage vital signs and the nursing notes.   Pertinent labs & imaging results that were available during my care of the patient were reviewed by me and considered in my medical decision making (see chart for details).     Left rib contusion X-ray of the chest and left ribs negative for acute bony abnormality, exam and vital signs reassuring today.  We will treat supportively with Flexeril, lidocaine patches, over-the-counter pain relievers, heat, rest.  Return for worsening symptoms.   Final Clinical Impressions(s) / UC Diagnoses    Final diagnoses:  None    Discharge Instructions   None      ED Prescriptions   None      PDMP not reviewed this encounter.   Volney American, [place] 05/27/22  1831

## 2022-05-27 NOTE — ED Triage Notes (Signed)
Pt reports broken ribs. Was doing laundry and twisted the wrong way. Left side can feel and hear something pop.Takes prescribed narcotics for his back but no relief.

## 2022-06-28 ENCOUNTER — Telehealth: Payer: Self-pay | Admitting: Internal Medicine

## 2022-06-28 MED ORDER — BUDESONIDE 0.5 MG/2ML IN SUSP
0.5000 mg | Freq: Two times a day (BID) | RESPIRATORY_TRACT | 3 refills | Status: DC
Start: 2022-06-28 — End: 2022-10-23

## 2022-06-28 MED ORDER — ALBUTEROL SULFATE (2.5 MG/3ML) 0.083% IN NEBU: 2.5000 mg | INHALATION_SOLUTION | Freq: Four times a day (QID) | RESPIRATORY_TRACT | 12 refills | Status: DC | PRN

## 2022-06-28 NOTE — Telephone Encounter (Signed)
Pt requesting albuterol 0.83 mg/ml solution and budesonide 0.'25mg'$ /2 ml sent to [facility]. Please advise. Started using them.  Dr. Melvyn Novas please advise, are you okay with [place] ordering budesonide neb solution for patient as well as albuterol?   I see in last ov notes that his plan C is the nebulizer but didn't see which neb medication and budesonide is not on his medication list.

## 2022-06-28 NOTE — Telephone Encounter (Signed)
Medications sent to [facility]. ATC patient and did not get an answer. LVM with [place] office number and asked patient to call back  to schedule ov. Nothing further needed

## 2022-06-28 NOTE — Telephone Encounter (Signed)
Can do albuterol with bud bid and then prn albuterol between but clearly needs ov next week with all meds in hand to regroup / reconcile his meds  - ok to add on to Friday pm schedule

## 2022-07-24 ENCOUNTER — Ambulatory Visit: Payer: No Typology Code available for payment source | Admitting: Family Medicine

## 2022-07-30 ENCOUNTER — Other Ambulatory Visit: Payer: Self-pay

## 2022-07-30 MED ORDER — BREZTRI AEROSPHERE 160-9-4.8 MCG/ACT IN AERO: 2.0000 | INHALATION_SPRAY | Freq: Two times a day (BID) | RESPIRATORY_TRACT | 11 refills | Status: DC

## 2022-08-20 DIAGNOSIS — Z681 Body mass index (BMI) 19 or less, adult: Secondary | ICD-10-CM | POA: Diagnosis not present

## 2022-08-20 DIAGNOSIS — U071 COVID-19: Secondary | ICD-10-CM | POA: Diagnosis not present

## 2022-09-02 ENCOUNTER — Other Ambulatory Visit: Payer: Self-pay | Admitting: Internal Medicine

## 2022-10-22 DIAGNOSIS — G894 Chronic pain syndrome: Secondary | ICD-10-CM | POA: Diagnosis not present

## 2022-10-22 DIAGNOSIS — M25519 Pain in unspecified shoulder: Secondary | ICD-10-CM | POA: Diagnosis not present

## 2022-10-22 DIAGNOSIS — Z79899 Other long term (current) drug therapy: Secondary | ICD-10-CM | POA: Diagnosis not present

## 2022-10-22 DIAGNOSIS — M5451 Vertebrogenic low back pain: Secondary | ICD-10-CM | POA: Diagnosis not present

## 2022-10-22 NOTE — Progress Notes (Unsigned)
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 Breathing is unchanged since the last visit. He is his albuterol inhaler once a day on average and has not used albuterol neb. He has some cough in the evenings- prod with clear sputum.   Dyspnea: walks dogs slow pace x 30-40 min / does not do shopping  Cough: minimal mucoid Sleep: flat bed s resp issues SABA use: rare  02 none   Rec Stop anoro and the budesonide nebulizer (because they are equivalent of Breztri)  Plan A = Automatic = Always=    Breztri Take 2 puffs first thing in am and then another 2 puffs about 12 hours later.  Work on inhaler technique:   Plan B = Backup (to supplement plan A, not to replace it) Only use your albuterol inhaler as a rescue medication Plan C = Crisis (instead of Plan B but only if Plan B stops working) - only use your albuterol nebulizer if you first try Plan B and it fails to help Try albuterol 15 min before an activity that you know would make you short of breath and see if it makes any difference and if makes none then don't take it after activity unless you can't catch your breath. Please schedule a follow up visit in 6 months but call sooner if needed  Add: rec booster with moderna or pfizer if he indeed received the J/J > 6 m ago     01/24/2021  f/u ov/[place] office/Evalette Montrose re:  COPD IV  Chief Complaint  Patient presents with   Follow-up    Breztri only working for 4-5 hours and then he can tell it wears off bc he starts  feeling more SOB. He has been coughing with clear to grey sputum and also wheezing. He is using his albuterol inhaler 3-4 x per day. He rarely uses neb.    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  Work on inhaler technique:  Plan B = Backup (to supplement plan A, not to replace it) Only use your albuterol inhaler as a rescue medication Plan C = Crisis (instead of Plan B but only if Plan B stops working) - only use your albuterol nebulizer if you first try Plan B and it fails to help > ok to use the nebulizer up to every 4 hours but if start needing it regularly call for immediate appointment  01/28/2022  f/u ov/[place] [facility] re: GOLD 4  maint on breztri  Chief Complaint  Patient presents with   Follow-up    Recent air quality has been bothering patient but otherwise, he has no concerns.   Dyspnea:  still walking dogs 3 x daily each time 30 min some hills but feels he's losing ground, much worse since [name] smoke exp  Cough: worse in am's / mucoid  Sleeping: flat bed/one pillow s cc  SABA use: using hfa 1st thing in am and "11 am"  (not the rec use)  02: none   Rec Prednisone 10 mg take  4 each am x 2 days,   2 each am x 2 days,  1 each am x 2 days and stop  Plan A = Automatic = Always=    Breztri Take 2 puffs first thing in am and then another 2 puffs about 12 hours later.   Plan B = Backup (to supplement plan A, not to replace it) Only use your albuterol inhaler as a rescue medication Plan C = Crisis (instead of Plan B but only  if Plan B stops working) - only use your albuterol nebulizer if you first try [place] to try albuterol 15 min before an activity (on alternating days)  that you know would usually make you short of breath        04/26/2022  f/u ov/[place] office/Kiona Blume re: GOLD 4 maint on breztri 2bid   Chief Complaint  Patient presents with   Follow-up    Feels breathing is not doing well since lov  Dyspnea:  walking dogs an hour after breztri /still feels needs saba neb daily before walking  Cough: some in ams x  30 min/ no better  Sleeping: flat bed /one pillow settles down p 10 min  SABA use: using neb before seems to help but then back slows him  02: none  Rec No change in medications  Please remember to go to the lab department   for your tests - we will call you with the results when they are available. Please remember to go to the  x-ray department  @  [facility] for your tests - we will call you with the results when they are available     Please schedule a follow up visit in 6 months but call sooner if needed    10/23/2022  f/u ov/[place] office/Lilliah Priego re: *** maint on ***  No chief complaint on file.   Dyspnea:  *** Cough: *** Sleeping: *** SABA use: *** 02: *** Covid status: *** Lung cancer screening: ***   No obvious day to day or daytime variability or assoc excess/ purulent sputum or mucus plugs or hemoptysis or cp or chest tightness, subjective wheeze or overt sinus or hb symptoms.   *** without nocturnal  or early am exacerbation  of respiratory  c/o's or need for noct saba. Also denies any obvious fluctuation of symptoms with weather or environmental changes or other aggravating or alleviating factors except as outlined above   No unusual exposure hx or h/o childhood pna/ asthma or knowledge of premature birth.  Current Allergies, Complete Past Medical History, Past Surgical History, Family History, and Social History were reviewed in [employer] record.  ROS  The following are not active complaints unless bolded Hoarseness, sore throat, dysphagia, dental problems, itching, sneezing,  nasal congestion or discharge of excess mucus or purulent secretions, ear ache,   fever, chills, sweats,  unintended wt loss or wt gain, classically pleuritic or exertional cp,  orthopnea pnd or arm/hand swelling  or leg swelling, presyncope, palpitations, abdominal pain, anorexia, nausea, vomiting, diarrhea  or change in bowel habits or change in bladder habits, change in stools or change in urine, dysuria, hematuria,  rash, arthralgias, visual complaints, headache, numbness, weakness or ataxia or problems with walking or coordination,  change in mood or  memory.        No outpatient medications have been marked as taking for the 10/23/22 encounter (Appointment) with Tanda Rockers, MD.              Objective:    Wts  10/23/2022        ***  04/26/2022       139   01/28/2022         135  07/24/2021     134   01/24/21 143 lb 3.2 oz (65 kg)  10/24/20 153 lb (69.4 kg)  06/27/20 154 lb (69.9 kg)    Vital signs reviewed  10/23/2022  - Note at rest 02 sats  ***% on ***   General appearance:    ***      Mod barr  Mild clubbing ***        04/26/2022 rec cxr did not go as requested      Assessment

## 2022-10-23 ENCOUNTER — Ambulatory Visit (INDEPENDENT_AMBULATORY_CARE_PROVIDER_SITE_OTHER): Payer: No Typology Code available for payment source | Admitting: Internal Medicine

## 2022-10-23 ENCOUNTER — Ambulatory Visit (HOSPITAL_COMMUNITY)
Admission: RE | Admit: 2022-10-23 | Discharge: 2022-10-23 | Disposition: A | Discharge status: Home / Self Care | Payer: No Typology Code available for payment source | Source: Ambulatory Visit | Attending: Internal Medicine | Admitting: Internal Medicine

## 2022-10-23 ENCOUNTER — Encounter: Payer: Self-pay | Admitting: Internal Medicine

## 2022-10-23 VITALS — BP 122/62 | HR 88 | Ht 72.0 in | Wt 139.6 lb

## 2022-10-23 DIAGNOSIS — J449 Chronic obstructive pulmonary disease, unspecified: Secondary | ICD-10-CM | POA: Insufficient documentation

## 2022-10-23 DIAGNOSIS — R490 Dysphonia: Secondary | ICD-10-CM

## 2022-10-23 DIAGNOSIS — J439 Emphysema, unspecified: Secondary | ICD-10-CM | POA: Diagnosis not present

## 2022-10-23 MED ORDER — PREDNISONE 10 MG PO TABS: ORAL_TABLET | ORAL | 0 refills | Status: DC

## 2022-10-23 MED ORDER — PANTOPRAZOLE SODIUM 40 MG PO TBEC: 40.0000 mg | DELAYED_RELEASE_TABLET | Freq: Every day | ORAL | 2 refills | Status: DC

## 2022-10-23 MED ORDER — ALBUTEROL SULFATE (2.5 MG/3ML) 0.083% IN NEBU
2.5000 mg | INHALATION_SOLUTION | RESPIRATORY_TRACT | 12 refills | Status: DC | PRN
Start: 2022-10-23 — End: 2023-08-27

## 2022-10-23 MED ORDER — FAMOTIDINE 20 MG PO TABS: ORAL_TABLET | ORAL | 11 refills | Status: DC

## 2022-10-23 NOTE — Patient Instructions (Signed)
Stop budesonide and breztri   Start Stiolto 2 puffs each am   Prednisone 20 mg each am until better then 1 daily until return   If you can't catch your breath,  albuterol nebulizer every 4 hours as needed   Pantoprazole (protonix) 40 mg   Take  30-60 min before first meal of the day and Pepcid (famotidine)  20 mg after supper until return to office - this is the best way to tell whether stomach acid is contributing to your problem.    GERD (REFLUX)  is an extremely common cause of respiratory symptoms just like yours , many times with no obvious heartburn at all.    It can be treated with medication, but also with lifestyle changes including elevation of the head of your bed (ideally with 6 -8inch blocks under the headboard of your bed),  Smoking cessation, avoidance of late meals, excessive alcohol, and avoid fatty foods, chocolate, peppermint, colas, red wine, and acidic juices such as orange juice.  NO MINT OR MENTHOL PRODUCTS SO NO COUGH DROPS  USE SUGARLESS CANDY INSTEAD (Jolley ranchers or Stover's or Life Savers) or even ice chips will also do - the key is to swallow to prevent all throat clearing. NO OIL BASED VITAMINS - use powdered substitutes.  Avoid fish oil when coughing.   My office will be contacting you by phone for referral to ENT for hoarseness   - if you don't hear back from my office within one week please call us back or notify us thru MyChart and we'll address it right away.  Please schedule a follow up office visit in 2weeks, sooner if needed  with all medications /inhalers/ solutions in hand so we can verify exactly what you are taking. This includes all medications from all doctors and over the counters

## 2022-10-24 DIAGNOSIS — R490 Dysphonia: Secondary | ICD-10-CM | POA: Insufficient documentation

## 2022-10-24 NOTE — Addendum Note (Signed)
Addended by: Christinia Gully B on: 10/24/2022 04:51 AM   Modules accepted: Level of Service

## 2022-10-24 NOTE — Assessment & Plan Note (Signed)
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) - 06/27/2020  After extensive coaching inhaler device,  effectiveness =    75% (short Ti) > try 2 week sample of breztri instead of anoro/pulmicort - 01/28/2022  After extensive coaching inhaler device,  effectiveness =    80% (still a bit short on the Ti) > continue breztri and more approp saba  - 01/28/2022   Walked on RA   x  3  lap(s) =  approx 450  ft  @ mod pace, stopped due to end of study sob p 1st lap with lowest 02 sats 94% - 04/26/2022   Walked on RA  x  3  lap(s) =  approx 450  ft  @   mod pace, stopped due to end of study  with lowest 02 sats 92% and sob on 2nd /3rd laps   - 10/23/2022  After extensive coaching inhaler device,  effectiveness =    75% smi so try stiolto and pred 20 mg daily until better then 10 mg daily until seen    Group D (now reclassified as E) in terms of symptom/risk and laba/lama/ICS  therefore appropriate rx at this point >>>  stiolto plus low dose prednisone until sort out cause of severe hoarseness

## 2022-10-24 NOTE — Assessment & Plan Note (Signed)
Onset early 2024 - rx max gerd rx 10/23/2022 >>> - referred to ent  10/23/2022 >>>  Will d/c all ICS for now if favor of low dose po steroids  Discussed in detail all the  indications, usual  risks and alternatives  relative to the benefits with patient who agrees to proceed with Rx as outlined.           Each maintenance medication was reviewed in detail including emphasizing most importantly the difference between maintenance and prns and under what circumstances the prns are to be triggered using an action plan format where appropriate.  Total time for H and P, chart review, counseling, reviewing hfa/smi/neb device(s) and generating customized AVS unique to this office visit / same day charting  > 40 min for  refractory respiratory  symptoms of uncertain etiology

## 2022-11-11 NOTE — Progress Notes (Signed)
Timothy Davis, male    DOB: May 17, 1961  MRN: [id number]   Brief patient profile:  56 yowm  MM/quit smoking 2003 / retired Psychologist, occupational with GOLD IV criteria in 09/13/16 referred to pulmonary clinic in [place]  06/27/2020 by Dr  Isaiah Serge since closer to his home.     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 Breathing is unchanged since the last visit. He is his albuterol inhaler once a day on average and has not used albuterol neb. He has some cough in the evenings- prod with clear sputum.   Dyspnea: walks dogs slow pace x 30-40 min / does not do shopping  Cough: minimal mucoid Sleep: flat bed s resp issues SABA use: rare  02 none   Rec Stop anoro and the budesonide nebulizer (because they are equivalent of Breztri)  Plan A = Automatic = Always=    Breztri Take 2 puffs first thing in am and then another 2 puffs about 12 hours later.  Work on inhaler technique:   Plan B = Backup (to supplement plan A, not to replace it) Only use your albuterol inhaler as a rescue medication Plan C = Crisis (instead of Plan B but only if Plan B stops working) - only use your albuterol nebulizer if you first try Plan B and it fails to help Try albuterol 15 min before an activity that you know would make you short of breath and see if it makes any difference and if makes none then don't take it after activity unless you can't catch your breath. Please schedule a follow up visit in 6 months but call sooner if needed  Add: rec booster with moderna or pfizer if he indeed received the J/J > 6 m ago     01/24/2021  f/u ov/Timothy Davis re:  COPD IV  Chief Complaint  Patient presents with   Follow-up    Breztri only working for 4-5 hours and then he can tell it wears off bc he starts  feeling more SOB. He has been coughing with clear to grey sputum and also wheezing. He is using his albuterol inhaler 3-4 x per day. He rarely uses neb.    COPD  GOLD IV  Quit smoking 2003  - 06/27/2020  After extensive coaching inhaler device,  effectiveness =    75% (short Ti) > try 2 week sample of breztri instead of anoro/pulmicort  Arc-welders' pneumoconiosis ([facility]) Dyspnea:  still walking dog 4 x daily  Cough: just 15 min p inhaler / mucoid Sleeping: ok flat/ one pillow SABA use: 3-4 x daily  02: none  Covid status: vax x twice  Rec Plan A = Automatic = Always=   Breztri Take 2 puffs first thing in am and then another 2 puffs about 12 hours later.  Work on inhaler technique:  Plan B = Backup (to supplement plan A, not to replace it) Only use your albuterol inhaler as a rescue medication Plan C = Crisis (instead of Plan B but only if Plan B stops working) - only use your albuterol nebulizer if you first try Plan B and it fails to help > ok to use the nebulizer up to every 4 hours but if start needing it regularly call for immediate appointment  01/28/2022  f/u ov/Timothy Davis re: GOLD 4  maint on breztri  Chief Complaint  Patient presents with   Follow-up    Recent air quality has been bothering patient but otherwise, he has no concerns.   Dyspnea:  still walking dogs 3 x daily each time 30 min some hills but feels he's losing ground, much worse since [place] smoke exp  Cough: worse in am's / mucoid  Sleeping: flat bed/one pillow s cc  SABA use: using hfa 1st thing in am and "11 am"  (not the rec use)  02: none   Rec Prednisone 10 mg take  4 each am x 2 days,   2 each am x 2 days,  1 each am x 2 days and stop  Plan A = Automatic = Always=    Breztri Take 2 puffs first thing in am and then another 2 puffs about 12 hours later.   Plan B = Backup (to supplement plan A, not to replace it) Only use your albuterol inhaler as a rescue medication Plan C = Crisis (instead of Plan B but only  if Plan B stops working) - only use your albuterol nebulizer if you first try Plan B  Ok to try albuterol 15 min before an activity (on alternating days)  that you know would usually make you short of breath        04/26/2022  f/u ov/Timothy Davis re: GOLD 4 maint on breztri 2bid   Chief Complaint  Patient presents with   Follow-up    Feels breathing is not doing well since lov  Dyspnea:  walking dogs an hour after breztri /still feels needs saba neb daily before walking  Cough: some in ams x  30 min/ no better  Sleeping: flat bed /one pillow settles down p 10 min  SABA use: using neb before seems to help but then back slows him  02: none  Rec No change in medications  Please remember to go to the lab department   for your tests - we will call you with the results when they are available. Please remember to go to the  x-ray department  @  [facility] for your tests - we will call you with the results when they are available     Please schedule a follow up visit in 6 months but call sooner if needed    10/23/2022  f/u ov/Timothy Davis re: GOLD 4  maint on breztri and budesonide neb  Chief Complaint  Patient presents with   Follow-up    Breathing worse since last ov    Dyspnea:  grocery store x 7-8 aisles  Cough: white mostly daytime asso with severe hoarseness  Sleeping: flat bed one pillow  SABA use: avg bid / not using neb  02: none  Rec Stop budesonide and breztri  Start Stiolto 2 puffs each am  Prednisone 20 mg each am until better then 1 daily until return  If you can't catch your breath,  albuterol nebulizer every 4 hours as needed  Pantoprazole (protonix) 40 mg   Take  30-60 min before first meal of the day and Pepcid (famotidine)  20 mg after supper  GERD diet reviewed, bed blocks rec  My office will be contacting you by phone for referral to ENT for hoarseness    Please schedule a follow up office visit in 2weeks, sooner if needed  with all  medications /inhalers/ solutions in hand      11/12/2022  f/u ov/Timothy Davis re: *** maint on ***  No chief complaint on file.   Dyspnea:  *** Cough: *** Sleeping: *** SABA use: *** 02: *** Covid status: *** Lung cancer screening: ***   No obvious day to day or daytime variability or assoc excess/ purulent sputum or mucus plugs or hemoptysis or cp or chest tightness, subjective wheeze or overt sinus or hb symptoms.   *** without nocturnal  or early am exacerbation  of respiratory  c/o's or need for noct saba. Also denies any obvious fluctuation of symptoms with weather or environmental changes or other aggravating or alleviating factors except as outlined above   No unusual exposure hx or h/o childhood pna/ asthma or knowledge of premature birth.  Current Allergies, Complete Past Medical History, Past Surgical History, Family History, and Social History were reviewed in [employer] record.  ROS  The following are not active complaints unless bolded Hoarseness, sore throat, dysphagia, dental problems, itching, sneezing,  nasal congestion or discharge of excess mucus or purulent secretions, ear ache,   fever, chills, sweats, unintended wt loss or wt gain, classically pleuritic or exertional cp,  orthopnea pnd or arm/hand swelling  or leg swelling, presyncope, palpitations, abdominal pain, anorexia, nausea, vomiting, diarrhea  or change in bowel habits or change in bladder habits, change in stools or change in urine, dysuria, hematuria,  rash, arthralgias, visual complaints, headache, numbness, weakness or ataxia or problems with walking or coordination,  change in mood or  memory.        No outpatient medications have been marked as taking for the 11/12/22 encounter (Appointment) with Nyoka Cowden, MD.             Objective:    Wts  11/12/2022         ***  10/23/2022        139  04/26/2022       139   01/28/2022         135  07/24/2021     134    01/24/21 143 lb 3.2 oz (65 kg)  10/24/20 153 lb (69.4 kg)  06/27/20 154 lb (69.9 kg)     Vital signs reviewed  11/12/2022  - Note at rest 02 sats  ***% on ***   General appearance:    ***     Mild bar   min  clubbing ***        Assessment

## 2022-11-12 ENCOUNTER — Encounter: Payer: Self-pay | Admitting: Internal Medicine

## 2022-11-12 ENCOUNTER — Ambulatory Visit (INDEPENDENT_AMBULATORY_CARE_PROVIDER_SITE_OTHER): Payer: No Typology Code available for payment source | Admitting: Internal Medicine

## 2022-11-12 VITALS — BP 100/65 | HR 64 | Ht 72.0 in | Wt 139.0 lb

## 2022-11-12 DIAGNOSIS — R490 Dysphonia: Secondary | ICD-10-CM

## 2022-11-12 DIAGNOSIS — J449 Chronic obstructive pulmonary disease, unspecified: Secondary | ICD-10-CM | POA: Diagnosis not present

## 2022-11-12 MED ORDER — BREZTRI AEROSPHERE 160-9-4.8 MCG/ACT IN AERO: INHALATION_SPRAY | RESPIRATORY_TRACT | Status: DC

## 2022-11-12 NOTE — Patient Instructions (Addendum)
Plan A = Automatic = Always=    Breztri  Take 2 puffs first thing in am and then another 2 puffs about 12 hours later.    Work on inhaler technique:  relax and gently blow all the way out then take a nice smooth full deep breath back in, triggering the inhaler at same time you start breathing in.  Hold breath in for at least  5 seconds if you can. Blow out breztri thru nose. Rinse and gargle with water when done.  If mouth or throat bother you at all,  try brushing teeth/gums/tongue with arm and hammer toothpaste/ make a slurry and gargle and spit out.   Continue prednisone but drop to 10 mg one half daily for now     Plan B = Backup (to supplement plan A, not to replace it) Only use your albuterol inhaler as a rescue medication to be used if you can't catch your breath by resting or doing a relaxed purse lip breathing pattern.  - The less you use it, the better it will work when you need it. - Ok to use the inhaler up to 2 puffs  every 4 hours if you must but call for appointment if use goes up over your usual need - Don't leave home without it !!  (think of it like the spare tire for your car)   Plan C = Crisis (instead of Plan B but only if Plan B stops working) - only use your albuterol nebulizer if you first try Plan B and it fails to help > ok to use the nebulizer up to every 4 hours but if start needing it regularly call for immediate appointment  Try albuterol 15 min before an activity (on alternating days using inhaler then nebulizer then nothing )  that you know would usually make you short of breath and see if it makes any difference and if makes none then don't take albuterol after activity unless you can't catch your breath as this means it's the resting that helps, not the albuterol.        Please schedule a follow up office visit in 3 weeks, sooner if needed with inhalders / neb solutions in hand

## 2022-11-12 NOTE — Assessment & Plan Note (Addendum)
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) - 06/27/2020  After extensive coaching inhaler device,  effectiveness =    75% (short Ti) > try 2 week sample of breztri instead of anoro/pulmicort - 01/28/2022  After extensive coaching inhaler device,  effectiveness =    80% (still a bit short on the Ti) > continue breztri and more approp saba  - 01/28/2022   Walked on RA   x  3  lap(s) =  approx 450  ft  @ mod pace, stopped due to end of study sob p 1st lap with lowest 02 sats 94% - 04/26/2022   Walked on RA  x  3  lap(s) =  approx 450  ft  @   mod pace, stopped due to end of study  with lowest 02 sats 92% and sob on 2nd /3rd laps   - 10/23/2022  After extensive coaching inhaler device,  effectiveness =    75% smi so try stiolto and pred 20 mg daily until better then 10 mg daily until seen > used up sample of stiolto in one week no better  - 11/12/2022  After extensive coaching inhaler device,  effectiveness =    80% from a baseline of 50 % so try breztri 2 bid and max gerd rx/ pred 5 mg daily  - 11/12/2022   Walked on RA  x  3  lap(s) =  approx 450  ft  @ moderately fast  pace, stopped due to end of study, sob  with sats not registering    Group D (now reclassified as E) in terms of symptom/risk and laba/lama/ICS  therefore appropriate rx at this point >>>  continue breztri 2bid/ pred 5 mg daily  and approp saba  Re SABA :  I spent extra time with pt today reviewing appropriate use of albuterol for prn use on exertion with the following points: 1) saba is for relief of sob that does not improve by walking a slower pace or resting but rather if the pt does not improve after trying this first. 2) If the pt is convinced, as many are, that saba helps recover from activity faster then it's easy to tell if this is the case by re-challenging : ie stop, take the inhaler, then p 5 minutes try the exact same activity (intensity of workload) that just caused the symptoms and see if they are substantially  diminished or not after saba 3) if there is an activity that reproducibly causes the symptoms, try the saba 15 min before the activity on alternate days   If in fact the saba really does help, then fine to continue to use it prn but advised may need to look closer at the maintenance regimen (Breztri/ pred 5 mg daily ) being used to achieve better control of airways disease with exertion.   Strongly also advised pulmonary rehab   Each maintenance medication was reviewed in detail including emphasizing most importantly the difference between maintenance and prns and under what circumstances the prns are to be triggered using an action plan format where appropriate.  Total time for H and P, chart review, counseling, reviewing hfa/smi/neb device(s) , directly observing portions of ambulatory 02 saturation study/ and generating customized AVS unique to this office visit / same day charting = > 30 min for  refractory respiratory  symptoms

## 2022-11-12 NOTE — Assessment & Plan Note (Signed)
Onset early 2024 - rx max gerd rx 10/23/2022 >>> - referred to ent  10/23/2022 >>> improved 11/12/2022 off breztri and on max gerd rx   Encouraged to keep appt though good sign this did improved quite a bit.  Now convinced was better though on breztri so rechallenge but use baking soda containing toothpaste to see if can control the upper airway irritation from breztri

## 2022-11-13 ENCOUNTER — Encounter: Payer: Self-pay | Admitting: Internal Medicine

## 2022-11-29 ENCOUNTER — Ambulatory Visit (HOSPITAL_COMMUNITY)
Admission: RE | Admit: 2022-11-29 | Discharge: 2022-11-29 | Disposition: A | Discharge status: Home / Self Care | Payer: No Typology Code available for payment source | Source: Ambulatory Visit | Attending: Family | Admitting: Family

## 2022-11-29 ENCOUNTER — Other Ambulatory Visit (HOSPITAL_COMMUNITY): Payer: Self-pay | Admitting: Family

## 2022-11-29 DIAGNOSIS — M5451 Vertebrogenic low back pain: Secondary | ICD-10-CM

## 2022-11-29 DIAGNOSIS — M4126 Other idiopathic scoliosis, lumbar region: Secondary | ICD-10-CM | POA: Diagnosis not present

## 2022-11-29 DIAGNOSIS — M545 Low back pain, unspecified: Secondary | ICD-10-CM | POA: Diagnosis not present

## 2022-12-02 NOTE — Progress Notes (Unsigned)
Timothy Davis, male    DOB: [birth year]  MRN: [id number]   Brief patient profile:  32  yowm  MM/quit smoking 2003 / retired Psychologist, occupational with GOLD IV criteria in 09/13/16 referred to pulmonary clinic in [place]  06/27/2020 by Dr  Timothy Davis since closer to his home.  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 Breathing is unchanged since the last visit. He is his albuterol inhaler once a day on average and has not used albuterol neb. He has some cough in the evenings- prod with clear sputum.   Dyspnea: walks dogs slow pace x 30-40 min / does not do shopping  Cough: minimal mucoid Sleep: flat bed s resp issues SABA use: rare  02 none   Rec Stop anoro and the budesonide nebulizer (because they are equivalent of Breztri)  Plan A = Automatic = Always=    Breztri Take 2 puffs first thing in am and then another 2 puffs about 12 hours later.  Work on inhaler technique:   Plan B = Backup (to supplement plan A, not to replace it) Only use your albuterol inhaler as a rescue medication Plan C = Crisis (instead of Plan B but only if Plan B stops working) - only use your albuterol nebulizer if you first try Plan B and it fails to help Try albuterol 15 min before an activity that you know would make you short of breath and see if it makes any difference and if makes none then don't take it after activity unless you can't catch your breath. Please schedule a follow up visit in 6 months but call sooner if needed  Add: rec booster with moderna or pfizer if he indeed received the J/J > 6 m ago     01/24/2021  f/u ov/[place] Davis/Timothy Davis re:  COPD IV  Chief Complaint  Patient presents with   Follow-up    Breztri only working for 4-5 hours and then he can tell it wears off bc he starts feeling  more SOB. He has been coughing with clear to grey sputum and also wheezing. He is using his albuterol inhaler 3-4 x per day. He rarely uses neb.   COPD  GOLD IV  Quit smoking 2003  - 06/27/2020  After extensive coaching inhaler device,  effectiveness =    75% (short Ti) > try 2 week sample of breztri instead of anoro/pulmicort  Arc-welders' pneumoconiosis (HCC) Dyspnea:  still walking dog 4 x daily  Cough: just 15 min p inhaler / mucoid Sleeping: ok flat/ one pillow SABA use: 3-4 x daily  02: none  Covid status: vax x twice  Rec Plan A = Automatic = Always=   Breztri Take 2 puffs first thing in am and then another 2 puffs about 12 hours later.  Work on inhaler technique:  Plan B = Backup (to supplement plan A, not to replace it) Only use your albuterol inhaler as a rescue medication Plan C = Crisis (instead of Plan B but only if Plan B stops working) - only use your albuterol nebulizer if you first try Plan B and it fails to help > ok to use the nebulizer up to every 4 hours but if start needing it regularly call for immediate appointment  10/23/2022  f/u ov/[place] Davis/Timothy Davis re: GOLD 4 / ? Welder's pneumoconiosis  maint on breztri and budesonide neb  Chief Complaint  Patient presents with   Follow-up    Breathing worse since last ov   Dyspnea:  grocery store x 7-8 aisles  Cough: white mostly daytime asso with severe hoarseness  Sleeping: flat bed one pillow  SABA use: avg bid / not using neb  02: none  Rec Stop budesonide and breztri  Start Stiolto 2 puffs each am  Prednisone 20 mg each am until better then 1 daily until return  If you can't catch your breath,  albuterol nebulizer every 4 hours as needed  Pantoprazole (protonix) 40 mg   Take  30-60 min before first meal of the day and Pepcid (famotidine)  20 mg after supper  GERD diet reviewed, bed blocks rec  My Davis will be contacting you by phone for referral to ENT for hoarseness    Please schedule a follow up  Davis visit in 2weeks, sooner if needed  with all medications /inhalers/ solutions in hand      11/12/2022  f/u ov/Timothy Davis/Timothy Davis re: copd gold 4 maint on just alb neb since ran out of stiolto ("p a week" ) liked breztri better though caused more hoarsness but did not restart breztri or call for advise Chief Complaint  Patient presents with   Follow-up    Pt f/u states that his symptoms are the same from 2 weeks ago - saw no benefit from the Stiolto sample  Dyspnea: mb and back nl pace does fine  Did fine at [employer] but wife outwalks him in term of pace  Cough: better off breztri and on pred now at 10 mg daily  SABA use: way over using hfa and neb  Rec Plan A = Automatic = Always=    Breztri  Take 2 puffs first thing in am and then another 2 puffs about 12 hours later.  Work on inhaler technique  Continue prednisone but drop to 10 mg one half daily for now    Plan B = Backup (to supplement plan A, not to replace it) Only use your albuterol inhaler as a rescue medication   Plan C = Crisis (instead of Plan B but only if Plan B stops working) - only use your albuterol nebulizer if you first try Plan B  Try albuterol 15 min before an activity (on alternating days using inhaler then nebulizer then nothing )  that you know would usually make you short of breath   Please schedule a follow up Davis visit in 3 weeks, sooner if needed with inhalders / neb solutions in hand    12/03/2022  f/u ov/[place] Davis/Timothy Davis re: doe  maint on breztri / prednisone 10 mg  Chief Complaint  Patient presents with   Follow-up    Pt f/u states that his breathing is baseline and he has no concerns.  Dyspnea:  would like to be able to keep up with his wife at [employer] but does not go to [employer] per wife  Cough: min discolored  Sleeping: level bed one pillow  SABA use: neb works the best  02: none      No obvious day to day or daytime variability or assoc excess/ purulent sputum or mucus plugs or  hemoptysis or cp or chest tightness, subjective wheeze or overt sinus or hb symptoms.   Sleeping  without nocturnal  or early am exacerbation  of respiratory  c/o's or need  for noct saba. Also denies any obvious fluctuation of symptoms with weather or environmental changes or other aggravating or alleviating factors except as outlined above   No unusual exposure hx or h/o childhood pna/ asthma or knowledge of premature birth.  Current Allergies, Complete Past Medical History, Past Surgical History, Family History, and Social History were reviewed in [employer] record.  ROS  The following are not active complaints unless bolded Hoarseness, sore throat, dysphagia, dental problems, itching, sneezing,  nasal congestion or discharge of excess mucus or purulent secretions, ear ache,   fever, chills, sweats, unintended wt loss or wt gain, classically pleuritic or exertional cp,  orthopnea pnd or arm/hand swelling  or leg swelling, presyncope, palpitations, abdominal pain, anorexia, nausea, vomiting, diarrhea  or change in bowel habits or change in bladder habits, change in stools or change in urine, dysuria, hematuria,  rash, arthralgias, visual complaints, headache, numbness, weakness or ataxia or problems with walking or coordination,  change in mood or  memory.        Current Meds  Medication Sig   albuterol (PROVENTIL) (2.5 MG/3ML) 0.083% nebulizer solution Take 3 mLs (2.5 mg total) by nebulization every 4 (four) hours as needed for wheezing or shortness of breath.   albuterol (VENTOLIN HFA) 108 (90 Base) MCG/ACT inhaler INHALE 2 PUFFS INTO THE LUNGS EVERY 6 HOURS AS NEEDED   Budeson-Glycopyrrol-Formoterol (BREZTRI AEROSPHERE) 160-9-4.8 MCG/ACT AERO Take 2 puffs first thing in am and then another 2 puffs about 12 hours later.   cyclobenzaprine (FLEXERIL) 10 MG tablet Take 1 tablet (10 mg total) by mouth 3 (three) times daily as needed for muscle spasms. Do not drink alcohol or  drive while taking this medication.  May cause drowsiness.   DULoxetine (CYMBALTA) 30 MG capsule Take 60 mg by mouth daily.   famotidine (PEPCID) 20 MG tablet One after supper   HYDROcodone-acetaminophen (NORCO) 7.5-325 MG tablet Take 1 tablet by mouth bid prn pain.   lidocaine (LIDODERM) 5 % Place 1 patch onto the skin daily. Remove & Discard patch within 12 hours or as directed by MD   pantoprazole (PROTONIX) 40 MG tablet Take 1 tablet (40 mg total) by mouth daily. Take 30-60 min before first meal of the day   predniSONE (DELTASONE) 10 MG tablet 2 daily with breafast until better  then 1 daily   pregabalin (LYRICA) 75 MG capsule Take 75 mg by mouth 2 (two) times daily.   traZODone (DESYREL) 100 MG tablet TAKE ONE TABLET BY MOUTH EVERY NIGHT AT BEDTIME                   Objective:    Wts  12/03/2022         138   11/12/2022       139   10/23/2022       139  04/26/2022       139   01/28/2022         135  07/24/2021     134   01/24/21 143 lb 3.2 oz (65 kg)  10/24/20 153 lb (69.4 kg)  06/27/20 154 lb (69.9 kg)    Vital signs reviewed  12/03/2022  - Note at rest 02 sats  93% on RA   General appearance:    somber thin amb wm nad  / severe hoarseness  HEENT :  Oropharynx  clear/ no thrush      NECK :  without JVD/Nodes/TM/ nl carotid upstrokes bilaterally   LUNGS: no  acc muscle use,  Mod barrel  contour chest wall with bilateral  Distant bs s audible wheeze and  without cough on insp or exp maneuvers and mod  Hyperresonant  to  percussion bilaterally     CV:  RRR  no s3 or murmur or increase in P2, and no edema   ABD:  soft and nontender with pos mid insp Hoover's  in the supine position. No bruits or organomegaly appreciated, bowel sounds nl  MS:   Ext warm without deformities or   obvious joint restrictions , calf tenderness, cyanosis  - Mild clubbing  SKIN: warm and dry without lesions    NEURO:  alert, approp, nl sensorium with  no motor or cerebellar deficits apparent.                Assessment

## 2022-12-03 ENCOUNTER — Ambulatory Visit (INDEPENDENT_AMBULATORY_CARE_PROVIDER_SITE_OTHER): Payer: No Typology Code available for payment source | Admitting: Internal Medicine

## 2022-12-03 ENCOUNTER — Encounter: Payer: Self-pay | Admitting: Internal Medicine

## 2022-12-03 VITALS — BP 138/79 | HR 70 | Ht 72.0 in | Wt 138.8 lb

## 2022-12-03 DIAGNOSIS — R490 Dysphonia: Secondary | ICD-10-CM

## 2022-12-03 DIAGNOSIS — R0902 Hypoxemia: Secondary | ICD-10-CM | POA: Diagnosis not present

## 2022-12-03 DIAGNOSIS — J449 Chronic obstructive pulmonary disease, unspecified: Secondary | ICD-10-CM

## 2022-12-03 MED ORDER — PREDNISONE 10 MG PO TABS: ORAL_TABLET | ORAL | 0 refills | Status: DC

## 2022-12-03 MED ORDER — STIOLTO RESPIMAT 2.5-2.5 MCG/ACT IN AERS: 2.0000 | INHALATION_SPRAY | Freq: Every day | RESPIRATORY_TRACT | 11 refills | Status: DC

## 2022-12-03 NOTE — Patient Instructions (Addendum)
Stop breztri and start stiolto 2 puffs each am instead   Prednisone 10 mg whole pill with breakfast for a week, then a half a pill thereafter if breathing or coughing worse, increase back to one daily until better then one - half  My office will be contacting you by phone for referral to ENT   - if you don't hear back from my office within one week please call us back or notify us thru MyChart and we'll address it right away.    Call if you want to do pulmonary rehab - in meantime pace yourself when you are walking so you keep your oxygen saturations at least in the upper 80s   Please schedule a follow up office visit in 6 weeks, call sooner if needed - bring inhalers and nebulizer solutions with you

## 2022-12-04 DIAGNOSIS — R0902 Hypoxemia: Secondary | ICD-10-CM | POA: Insufficient documentation

## 2022-12-04 NOTE — Assessment & Plan Note (Signed)
12/03/2022   Walked on RA  x  3  lap(s) =  approx 450  ft  @ mod pace, stopped due to end of study  with lowest 02 sats 84% and mild sob p 2nd lap  >>> declined 02/ rehab   Advised to walk at slower pace and avoid sats < 88% or accept 02 or risk end organ injury / deconditioning from avoiding activities altogether  F/u in 6 weeks with all meds in hand using a trust but verify approach to confirm accurate Medication  Reconciliation The principal here is that until we are certain that the  patients are doing what we've asked, it makes no sense to ask them to do more.   Each maintenance medication was reviewed in detail including emphasizing most importantly the difference between maintenance and prns and under what circumstances the prns are to be triggered using an action plan format where appropriate.  Total time for H and P, chart review, counseling, reviewing smi device(s) , directly observing portions of ambulatory 02 saturation study/ and generating customized AVS unique to this office visit / same day charting = 41 min

## 2022-12-04 NOTE — Assessment & Plan Note (Addendum)
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) - PFT's  11/11/19   FEV1 0.81 (20 % ) ratio 0.34  p 0 % improvement from saba p anoro prior to study with DLCO  19 (64%)   and FV curve classic severely concave   - 06/27/2020  After extensive coaching inhaler device,  effectiveness =    75% (short Ti) > try 2 week sample of breztri instead of anoro/pulmicort - 01/28/2022  After extensive coaching inhaler device,  effectiveness =    80% (still a bit short on the Ti) > continue breztri and more approp saba  - 01/28/2022   Walked on RA   x  3  lap(s) =  approx 450  ft  @ mod pace, stopped due to end of study sob p 1st lap with lowest 02 sats 94% - 04/26/2022   Walked on RA  x  3  lap(s) =  approx 450  ft  @   mod pace, stopped due to end of study  with lowest 02 sats 92% and sob on 2nd /3rd laps   - 10/23/2022  After extensive coaching inhaler device,  effectiveness =    75% smi so try stiolto and pred 20 mg daily until better then 10 mg daily until seen > used up sample of stiolto in one week no better  - 11/12/2022  After extensive coaching inhaler device,  effectiveness =    80% from a baseline of 50 % so try breztri 2 bid and max gerd rx/ pred 5 mg daily  - 11/12/2022   Walked on RA  x  3  lap(s) =  approx 450  ft  @ moderately fast  pace, stopped due to end of study, sob  with sats not registering   - 12/03/2022  After extensive coaching inhaler device,  effectiveness =    75% with smi > changed back to stiolto/ pred daily due to hoarseness - 12/03/2022  ex hypoxemia (see sep a/p, declined 02)    Group D (now reclassified as E) in terms of symptom/risk and laba/lama/ICS  therefore appropriate rx at this point >>>  stiolto with pred ceiling 20 and floor 10  plus approp saba  Re SABA :  I spent extra time with pt today reviewing appropriate use of albuterol for prn use on exertion with the following points: 1) saba is for relief of sob that does not improve by walking a slower pace or resting but rather if  the pt does not improve after trying this first. 2) If the pt is convinced, as many are, that saba helps recover from activity faster then it's easy to tell if this is the case by re-challenging : ie stop, take the inhaler, then p 5 minutes try the exact same activity (intensity of workload) that just caused the symptoms and see if they are substantially diminished or not after saba 3) if there is an activity that reproducibly causes the symptoms, try the saba 15 min before the activity on alternate days   If in fact the saba really does help, then fine to continue to use it prn but advised may need to look closer at the maintenance regimen being used to achieve better control of airways disease with exertion.

## 2022-12-04 NOTE — Assessment & Plan Note (Signed)
Onset early 2024 - rx max gerd rx 10/23/2022 >>> - referred to ent  10/23/2022 >>> improved 11/12/2022 off breztri and on max gerd rx > worse 12/03/2022  to point where can barely understand him > referred to ENT

## 2023-01-14 ENCOUNTER — Encounter: Payer: Self-pay | Admitting: Internal Medicine

## 2023-01-14 ENCOUNTER — Ambulatory Visit (INDEPENDENT_AMBULATORY_CARE_PROVIDER_SITE_OTHER): Payer: No Typology Code available for payment source | Admitting: Internal Medicine

## 2023-01-14 VITALS — BP 103/62 | HR 71 | Ht 72.0 in | Wt 138.8 lb

## 2023-01-14 DIAGNOSIS — J449 Chronic obstructive pulmonary disease, unspecified: Secondary | ICD-10-CM

## 2023-01-14 DIAGNOSIS — R490 Dysphonia: Secondary | ICD-10-CM | POA: Diagnosis not present

## 2023-01-14 DIAGNOSIS — R0902 Hypoxemia: Secondary | ICD-10-CM

## 2023-01-14 MED ORDER — BREZTRI AEROSPHERE 160-9-4.8 MCG/ACT IN AERO: 2.0000 | INHALATION_SPRAY | Freq: Two times a day (BID) | RESPIRATORY_TRACT | 11 refills | Status: DC

## 2023-01-14 NOTE — Assessment & Plan Note (Signed)
Onset early 2024 - rx max gerd rx 10/23/2022 >>> - referred to ent  10/23/2022 >>> improved 11/12/2022 off breztri and on max gerd rx > worse 12/03/2022  to point where can barely understand him > referred to ENT > declined 01/14/2023 and requested breztri rechallenge as "voice not that bad"   Advised though I agree his voice is better, and maybe this is due to changing breztri to stiolto, still concerned it's present and would like ENT input but he declined   F/u 6 m, sooner if needed         Each maintenance medication was reviewed in detail including emphasizing most importantly the difference between maintenance and prns and under what circumstances the prns are to be triggered using an action plan format where appropriate.  Total time for H and P, chart review, counseling, reviewing hfa/neb  device(s) and generating customized AVS unique to this office visit / same day charting = 31 min

## 2023-01-14 NOTE — Patient Instructions (Signed)
Plan A = Automatic = Always=    Breztri Take 2 puffs first thing in am and then another 2 puffs about 12 hours later.     Plan B = Backup (to supplement plan A, not to replace it) Only use your albuterol inhaler as a rescue medication to be used if you can't catch your breath by resting or doing a relaxed purse lip breathing pattern.  - The less you use it, the better it will work when you need it. - Ok to use the inhaler up to 2 puffs  every 4 hours if you must but call for appointment if use goes up over your usual need - Don't leave home without it !!  (think of it like the spare tire for your car)   Plan C = Crisis (instead of Plan B but only if Plan B stops working) - only use your albuterol nebulizer if you first try Plan B and it fails to help > ok to use the nebulizer up to every 4 hours but if start needing it regularly call for immediate appointment   Also  Ok to try albuterol 15 min before an activity (on alternating days)  that you know would usually make you short of breath and see if it makes any difference and if makes none then don't take albuterol after activity unless you can't catch your breath as this means it's the resting that helps, not the albuterol.      If you change your mind about oxygen with exercise, or the throat doctor, or rehab please call    Please schedule a follow up visit in 6  months but call sooner if needed

## 2023-01-14 NOTE — Progress Notes (Signed)
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 Breathing is unchanged since the last visit. He is his albuterol inhaler once a day on average and has not used albuterol neb. He has some cough in the evenings- prod with clear sputum.   Dyspnea: walks dogs slow pace x 30-40 min / does not do shopping  Cough: minimal mucoid Sleep: flat bed s resp issues SABA use: rare  02 none   Rec Stop anoro and the budesonide nebulizer (because they are equivalent of Breztri)  Plan A = Automatic = Always=    Breztri Take 2 puffs first thing in am and then another 2 puffs about 12 hours later.  Work on inhaler technique:   Plan B = Backup (to supplement plan A, not to replace it) Only use your albuterol inhaler as a rescue medication Plan C = Crisis (instead of Plan B but only if Plan B stops working) - only use your albuterol nebulizer if you first try Plan B and it fails to help Try albuterol 15 min before an activity that you know would make you short of breath and see if it makes any difference and if makes none then don't take it after activity unless you can't catch your breath. Please schedule a follow up visit in 6 months but call sooner if needed  Add: rec booster with moderna or pfizer if he indeed received the J/J > 6 m ago     01/24/2021  f/u ov/Timothy Davis re:  COPD IV  Chief Complaint  Patient presents with   Follow-up    Breztri only working for 4-5 hours and then he can tell it wears off bc he starts feeling  more SOB. He has been coughing with clear to grey sputum and also wheezing. He is using his albuterol inhaler 3-4 x per day. He rarely uses neb.   COPD  GOLD IV  Quit smoking 2003  - 06/27/2020  After extensive coaching inhaler device,  effectiveness =    75% (short Ti) > try 2 week sample of breztri instead of anoro/pulmicort  Arc-welders' pneumoconiosis (HCC) Dyspnea:  still walking dog 4 x daily  Cough: just 15 min p inhaler / mucoid Sleeping: ok flat/ one pillow SABA use: 3-4 x daily  02: none  Covid status: vax x twice  Rec Plan A = Automatic = Always=   Breztri Take 2 puffs first thing in am and then another 2 puffs about 12 hours later.  Work on inhaler technique:  Plan B = Backup (to supplement plan A, not to replace it) Only use your albuterol inhaler as a rescue medication Plan C = Crisis (instead of Plan B but only if Plan B stops working) - only use your albuterol nebulizer if you first try Plan B and it fails to help > ok to use the nebulizer up to every 4 hours but if start needing it regularly call for immediate appointment  12/03/2022  f/u ov/[place] office/Timothy Davis re: GOLD 4 copd doe  maint on breztri / prednisone 10 mg  Chief Complaint  Patient presents with   Follow-up    Pt f/u states that his breathing is baseline and he has no concerns.  Dyspnea:  would like to be able to keep up with his wife at [employer] but does not go to [employer] per wife  Cough: min discolored  Sleeping: level bed one pillow  SABA use: neb works the best  02: none  Rec Stop breztri and start stiolto 2 puffs each am instead  Prednisone 10 mg whole pill with breakfast for a week, then a half a pill thereafter if breathing or coughing worse, increase back to one daily until better then one - half My office will be contacting you by phone for referral to ENT    Call if you want to do pulmonary rehab - in meantime pace yourself when you are walking so you keep your oxygen saturations at  least in the upper 80s   Please schedule a follow up office visit in 6 weeks, call sooner if needed - bring inhalers and nebulizer solutions with you    01/14/2023  f/u ov/[place] office/Timothy Davis re: GOLD 4/arc welders pneumoconiosis   maint on stiolto x 2 and pred 5 mg daily   Chief Complaint  Patient presents with   Follow-up  Dyspnea:  no change stiolto/ pred and feels no change in voice and declined ent referral and RT / prefers breztri rechallenge  Cough: min mucoid  Sleeping: level bed one pillow  SABA use: only when can't catch breath. Did not prechallenge as rc  02: none   No obvious day to day or daytime variability or assoc excess/ purulent sputum or mucus plugs or hemoptysis or cp or chest tightness, subjective wheeze or overt sinus or hb symptoms.   Sleeping  without nocturnal  or early am exacerbation  of respiratory  c/o's or need for noct saba. Also denies any obvious fluctuation of symptoms with weather or environmental changes or other aggravating or alleviating factors except as outlined above   No unusual exposure hx or h/o childhood pna/ asthma or knowledge of premature birth.  Current Allergies, Complete Past Medical History, Past Surgical History, Family History, and Social History were reviewed in [employer] record.  ROS  The following are not active complaints unless bolded Hoarseness, sore throat, dysphagia, dental problems, itching, sneezing,  nasal congestion or discharge of excess mucus or purulent secretions, ear ache,   fever, chills, sweats, unintended wt loss or wt gain, classically pleuritic or exertional cp,  orthopnea pnd or arm/hand swelling  or leg swelling, presyncope, palpitations, abdominal pain, anorexia, nausea, vomiting, diarrhea  or change in bowel habits or change in bladder habits, change in stools or change in urine, dysuria, hematuria,  rash, arthralgias, visual complaints, headache, numbness, weakness or ataxia or problems  with walking or coordination,  change in mood or  memory.        Current Meds  Medication Sig   albuterol (PROVENTIL) (2.5 MG/3ML) 0.083% nebulizer solution Take 3 mLs (2.5 mg total) by nebulization every 4 (four) hours as needed for wheezing or shortness of breath.   albuterol (VENTOLIN HFA) 108 (90 Base) MCG/ACT inhaler INHALE 2 PUFFS INTO THE LUNGS EVERY 6 HOURS AS NEEDED   cyclobenzaprine (FLEXERIL) 10 MG tablet Take 1 tablet (10 mg total) by mouth 3 (three) times daily as needed for muscle spasms. Do not  drink alcohol or drive while taking this medication.  May cause drowsiness.   DULoxetine (CYMBALTA) 30 MG capsule Take 60 mg by mouth daily.   famotidine (PEPCID) 20 MG tablet One after supper   HYDROcodone-acetaminophen (NORCO) 7.5-325 MG tablet Take 1 tablet by mouth bid prn pain.   lidocaine (LIDODERM) 5 % Place 1 patch onto the skin daily. Remove & Discard patch within 12 hours or as directed by MD   pantoprazole (PROTONIX) 40 MG tablet Take 1 tablet (40 mg total) by mouth daily. Take 30-60 min before first meal of the day   predniSONE (DELTASONE) 10 MG tablet 1 daily with breafast until better  then  1/2 daily   pregabalin (LYRICA) 75 MG capsule Take 75 mg by mouth 2 (two) times daily.   Tiotropium Bromide-Olodaterol (STIOLTO RESPIMAT) 2.5-2.5 MCG/ACT AERS Inhale 2 puffs into the lungs daily.   traZODone (DESYREL) 100 MG tablet TAKE ONE TABLET BY MOUTH EVERY NIGHT AT BEDTIME                    Objective:    Wts  01/14/2023       138  12/03/2022         138   11/12/2022       139   10/23/2022       139  04/26/2022       139   01/28/2022         135  07/24/2021     134   01/24/21 143 lb 3.2 oz (65 kg)  10/24/20 153 lb (69.4 kg)  06/27/20 154 lb (69.9 kg)    Vital signs reviewed  01/14/2023  - Note at rest 02 sats  93% on RA   General appearance:    somber wm still quit hoarse but no pseudowheeze   HEENT :  Oropharynx  clear     NECK :  without JVD/Nodes/TM/ nl carotid  upstrokes bilaterally   LUNGS: no acc muscle use,  Mod barrel  contour chest wall with bilateral  Distant bs s audible wheeze and  without cough on insp or exp maneuvers and mod  Hyperresonant  to  percussion bilaterally     CV:  RRR  no s3 or murmur or increase in P2, and no edema   ABD:  soft and nontender with pos mid insp Hoover's  in the supine position. No bruits or organomegaly appreciated, bowel sounds nl  MS:   Ext warm without deformities or   obvious joint restrictions , calf tenderness, cyanosis or - Mild clubbing  SKIN: warm and dry without lesions    NEURO:  alert, approp, nl sensorium with  no motor or cerebellar deficits apparent.                     Assessment

## 2023-01-14 NOTE — Assessment & Plan Note (Signed)
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) - PFT's  11/11/19   FEV1 0.81 (20 % ) ratio 0.34  p 0 % improvement from saba p anoro prior to study with DLCO  19 (64%)   and FV curve classic severely concave   - 06/27/2020  After extensive coaching inhaler device,  effectiveness =    75% (short Ti) > try 2 week sample of breztri instead of anoro/pulmicort - 01/28/2022  After extensive coaching inhaler device,  effectiveness =    80% (still a bit short on the Ti) > continue breztri and more approp saba  - 01/28/2022   Walked on RA   x  3  lap(s) =  approx 450  ft  @ mod pace, stopped due to end of study sob p 1st lap with lowest 02 sats 94% - 04/26/2022   Walked on RA  x  3  lap(s) =  approx 450  ft  @   mod pace, stopped due to end of study  with lowest 02 sats 92% and sob on 2nd /3rd laps   - 10/23/2022  After extensive coaching inhaler device,  effectiveness =    75% smi so try stiolto and pred 20 mg daily until better then 10 mg daily until seen > used up sample of stiolto in one week no better  - 11/12/2022  After extensive coaching inhaler device,  effectiveness =    80% from a baseline of 50 % so try breztri 2 bid and max gerd rx/ pred 5 mg daily  - 11/12/2022   Walked on RA  x  3  lap(s) =  approx 450  ft  @ moderately fast  pace, stopped due to end of study, sob  with sats not registering  - 12/03/2022  After extensive coaching inhaler device,  effectiveness =    75% with smi > changed back to stiolto/ pred daily due to hoarseness - 12/03/2022  ex hypoxemia (see sep a/p, declined 02)  - 01/14/2023 preferred breztri > restart and d/c daily pred as did not feel it helped    Group D (now reclassified as E) in terms of symptom/risk and laba/lama/ICS  therefore appropriate rx at this point >>>  breztri plus approp saba  Re SABA :  I spent extra time with pt today reviewing appropriate use of albuterol for prn use on exertion with the following points: 1) saba is for relief of sob that does not improve  by walking a slower pace or resting but rather if the pt does not improve after trying this first. 2) If the pt is convinced, as many are, that saba helps recover from activity faster then it's easy to tell if this is the case by re-challenging : ie stop, take the inhaler, then p 5 minutes try the exact same activity (intensity of workload) that just caused the symptoms and see if they are substantially diminished or not after saba 3) if there is an activity that reproducibly causes the symptoms, try the saba 15 min before the activity on alternate days   If in fact the saba really does help, then fine to continue to use it prn but advised may need to look closer at the maintenance regimen being used to achieve better control of airways disease with exertion.

## 2023-01-17 ENCOUNTER — Ambulatory Visit: Payer: No Typology Code available for payment source | Admitting: Internal Medicine

## 2023-01-17 NOTE — Addendum Note (Signed)
Addended by: Jaynee Eagles on: 01/17/2023 04:10 PM   Modules accepted: Orders

## 2023-01-20 ENCOUNTER — Ambulatory Visit: Payer: No Typology Code available for payment source | Admitting: Internal Medicine

## 2023-01-21 ENCOUNTER — Ambulatory Visit: Payer: No Typology Code available for payment source | Admitting: Internal Medicine

## 2023-05-06 ENCOUNTER — Ambulatory Visit (INDEPENDENT_AMBULATORY_CARE_PROVIDER_SITE_OTHER): Payer: No Typology Code available for payment source | Admitting: Family Medicine

## 2023-05-06 VITALS — BP 162/87 | HR 59 | Temp 97.7°F | Ht 72.0 in | Wt 140.4 lb

## 2023-05-06 DIAGNOSIS — D72819 Decreased white blood cell count, unspecified: Secondary | ICD-10-CM

## 2023-05-06 DIAGNOSIS — Z125 Encounter for screening for malignant neoplasm of prostate: Secondary | ICD-10-CM | POA: Diagnosis not present

## 2023-05-06 DIAGNOSIS — Z23 Encounter for immunization: Secondary | ICD-10-CM

## 2023-05-06 DIAGNOSIS — G894 Chronic pain syndrome: Secondary | ICD-10-CM

## 2023-05-06 DIAGNOSIS — M545 Low back pain, unspecified: Secondary | ICD-10-CM | POA: Diagnosis not present

## 2023-05-06 DIAGNOSIS — E785 Hyperlipidemia, unspecified: Secondary | ICD-10-CM

## 2023-05-06 DIAGNOSIS — K7400 Hepatic fibrosis, unspecified: Secondary | ICD-10-CM | POA: Diagnosis not present

## 2023-05-06 DIAGNOSIS — G8929 Other chronic pain: Secondary | ICD-10-CM

## 2023-05-06 NOTE — Progress Notes (Signed)
Subjective:  Patient ID: Timothy Davis, male    DOB: May 18, 1961  Age: 62 y.o. MRN: [id number]  CC: Referral to pain management  HPI:  62 year old male with COPD, history of hepatitis C, hepatic fibrosis, chronic back pain/chronic pain, hyperlipidemia, insomnia presents for evaluation of the above.  Patient states that he has been followed by pain management.  He states that his pain medication has recently been changed and he can no longer afford it.  Last prescription was on 9/20.  Patient would like referral to another pain clinic.  He reports chronic low back pain and chronic left shoulder pain.  COPD is stable at this time.  Follows with pulmonology.  Currently on Breztri.  Patient has not seen gastroenterology in over a year.  Has a history of hepatitis C and has known hepatic fibrosis.  Needs ultrasound.  Patient amenable to flu shot today.  Patient Active Problem List   Diagnosis Date Noted   Exercise hypoxemia 12/04/2022   Hoarseness 10/24/2022   Arc-welders' pneumoconiosis (HCC) 06/27/2020   Hyperlipidemia 04/26/2020   Former smoker 08/02/2019   Hepatic fibrosis 10/24/2017   Hx of adenomatous colonic polyps 10/24/2017   Pulmonary emphysema (HCC) 08/05/2016   Chronic back pain 01/12/2013   Insomnia 01/12/2013   Hepatitis C virus infection without hepatic coma 04/23/2012   COPD  GOLD IV  07/06/2007    Social Hx   Social History   Socioeconomic History   Marital status: Married    Spouse name: Not on file   Number of children: 2   Years of education: Not on file   Highest education level: Not on file  Occupational History   Occupation: disabled    Employer: DISABLED  Tobacco Use   Smoking status: Former    Current packs/day: 0.00    Average packs/day: 1.5 packs/day for 27.4 years (41.0 ttl pk-yrs)    Types: Cigarettes    Start date: 12/16/1974    Quit date: 04/23/2002    Years since quitting: 21.0   Smokeless tobacco: Current    Types: Chew  Vaping Use    Vaping status: Never Used  Substance and Sexual Activity   Alcohol use: Yes    Comment: 12 pack on weekend   Drug use: No    Comment: Hx marijuana, intranasal drugs, crack, cocaine.  QUIT 5 yrs ago   Sexual activity: Yes    Partners: Female    Comment: monagamous  Other Topics Concern   Not on file  Social History Narrative   Lives w/ wife, 2sons      [facility] (08/05/16):   Originally from [place], [place]. Moved to [place] when he was 62 y.o. Since then he has lived in [place]. 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 No known asbestos exposure. Currently has dogs and a cat. No bird or mold exposure. Enjoys fishing.    Social Determinants of Health   Financial Resource Strain: Not on file  Food Insecurity: Not on file  Transportation Needs: Not on file  Physical Activity: Not on file  Stress: Not on file  Social Connections: Not on file    Review of Systems Per HPI  Objective:  BP (!) 162/87   Pulse (!) 59   Temp 97.7 F (36.5 C)   Ht 6' (1.829 m)   Wt 140 lb 6.4 oz (63.7 kg)   SpO2 100%   BMI  19.04 kg/m      05/06/2023   10:09 AM 01/14/2023   11:16 AM 12/03/2022    3:50 PM  BP/Weight  Systolic BP 162 103 138  Diastolic BP 87 62 79  Wt. (Lbs) 140.4 138.8 138.8  BMI 19.04 kg/m2 18.82 kg/m2 18.82 kg/m2    Physical Exam Vitals and nursing note reviewed.  Constitutional:      General: He is not in acute distress. HENT:     Head: Normocephalic and atraumatic.  Eyes:     General:        Right eye: No discharge.        Left eye: No discharge.     Conjunctiva/sclera: Conjunctivae normal.  Cardiovascular:     Rate and Rhythm: Normal rate and regular rhythm.  Pulmonary:     Effort: Pulmonary effort is normal.     Breath sounds: Normal breath sounds.  Neurological:     Mental Status: He is alert.  Psychiatric:     Comments: Flat affect.     Lab Results  Component Value Date    WBC 4.3 04/26/2022   HGB 14.0 04/26/2022   HCT 41.5 04/26/2022   PLT 175 04/26/2022   GLUCOSE 85 04/26/2022   CHOL 195 04/27/2020   TRIG 81 04/27/2020   HDL 51 04/27/2020   LDLCALC 126 (H) 04/27/2020   ALT 14 04/27/2020   AST 22 04/27/2020   NA 138 04/26/2022   K 4.6 04/26/2022   CL 99 04/26/2022   CREATININE 0.81 04/26/2022   BUN 19 04/26/2022   CO2 27 04/26/2022   TSH 2.380 04/26/2022   PSA 0.3 10/27/2019   INR 1.08 07/29/2017   HGBA1C 5.5 04/26/2020     Assessment & Plan:   Problem List Items Addressed This Visit       Digestive   Hepatic fibrosis    Placing order for ultrasound.  Obtaining labs today as well.      Relevant Orders   Comprehensive metabolic panel   US ABDOMEN RUQ W/ELASTOGRAPHY     Other   Hyperlipidemia   Relevant Orders   Lipid panel   Chronic back pain - Primary    Referring to pain management.      Relevant Orders   Ambulatory referral to Pain Clinic   Other Visit Diagnoses     Immunization due       Relevant Orders   Flu vaccine trivalent PF, 6mos and older(Flulaval,Afluria,Fluarix,Fluzone) (Completed)   Prostate cancer screening       Relevant Orders   PSA   Leukopenia, unspecified type       Relevant Orders   CBC   Chronic pain syndrome       Relevant Orders   Ambulatory referral to Pain Clinic      Follow-up:  Return in about 2 weeks (around 05/20/2023), or BP Check.  Everlene Other DO [facility]

## 2023-05-06 NOTE — Assessment & Plan Note (Signed)
Referring to pain management

## 2023-05-06 NOTE — Assessment & Plan Note (Signed)
Placing order for ultrasound.  Obtaining labs today as well.

## 2023-05-06 NOTE — Patient Instructions (Signed)
Check BP @ home.  Follow up in 2 weeks to recheck blood pressure.  Labs ordered.  Referral placed.  Recommend US of the liver.

## 2023-05-07 LAB — CBC
HCT: 43.5 % (ref 38.5–50.0)
Hemoglobin: 13.9 g/dL (ref 13.2–17.1)
MCH: 30.3 pg (ref 27.0–33.0)
MCHC: 32 g/dL (ref 32.0–36.0)
MCV: 95 fL (ref 80.0–100.0)
MPV: 10.3 fL (ref 7.5–12.5)
Platelets: 177 10*3/uL (ref 140–400)
RBC: 4.58 10*6/uL (ref 4.20–5.80)
RDW: 13 % (ref 11.0–15.0)
WBC: 5.1 10*3/uL (ref 3.8–10.8)

## 2023-05-07 LAB — COMPREHENSIVE METABOLIC PANEL
AG Ratio: 1.7 (calc) (ref 1.0–2.5)
ALT: 11 U/L (ref 9–46)
AST: 20 U/L (ref 10–35)
Albumin: 4.5 g/dL (ref 3.6–5.1)
Alkaline phosphatase (APISO): 64 U/L (ref 35–144)
BUN: 17 mg/dL (ref 7–25)
CO2: 31 mmol/L (ref 20–32)
Calcium: 9.5 mg/dL (ref 8.6–10.3)
Chloride: 104 mmol/L (ref 98–110)
Creat: 0.79 mg/dL (ref 0.70–1.35)
Globulin: 2.6 g/dL (ref 1.9–3.7)
Glucose, Bld: 91 mg/dL (ref 65–99)
Potassium: 4 mmol/L (ref 3.5–5.3)
Sodium: 143 mmol/L (ref 135–146)
Total Bilirubin: 0.3 mg/dL (ref 0.2–1.2)
Total Protein: 7.1 g/dL (ref 6.1–8.1)

## 2023-05-07 LAB — LIPID PANEL
Cholesterol: 183 mg/dL (ref <200)
HDL: 65 mg/dL (ref >=40)
LDL Cholesterol (Calc): 96 mg/dL
Non-HDL Cholesterol (Calc): 118 mg/dL (ref <130)
Total CHOL/HDL Ratio: 2.8 (calc) (ref <5.0)
Triglycerides: 122 mg/dL (ref <150)

## 2023-05-07 LAB — PSA: PSA: 0.46 ng/mL (ref <=4.00)

## 2023-05-12 ENCOUNTER — Ambulatory Visit (HOSPITAL_COMMUNITY)
Admission: RE | Admit: 2023-05-12 | Discharge: 2023-05-12 | Disposition: A | Discharge status: Home / Self Care | Payer: No Typology Code available for payment source | Source: Ambulatory Visit | Attending: Family Medicine | Admitting: Family Medicine

## 2023-05-12 DIAGNOSIS — K7400 Hepatic fibrosis, unspecified: Secondary | ICD-10-CM | POA: Insufficient documentation

## 2023-05-12 DIAGNOSIS — R932 Abnormal findings on diagnostic imaging of liver and biliary tract: Secondary | ICD-10-CM | POA: Diagnosis not present

## 2023-05-20 ENCOUNTER — Ambulatory Visit: Payer: No Typology Code available for payment source | Admitting: Family Medicine

## 2023-05-20 VITALS — BP 136/77 | HR 60 | Temp 98.1°F | Ht 72.0 in | Wt 137.4 lb

## 2023-05-20 DIAGNOSIS — R030 Elevated blood-pressure reading, without diagnosis of hypertension: Secondary | ICD-10-CM

## 2023-05-20 NOTE — Patient Instructions (Signed)
Continue your medications.    Follow up in 6 months

## 2023-05-20 NOTE — Progress Notes (Signed)
Subjective:  Patient ID: Timothy Davis, male    DOB: 09-25-1960  Age: 62 y.o. MRN: [id number]  CC: Recheck blood pressure   HPI:  62 year old male presents for reevaluation of elevated BP.  At his last visit, his blood pressure was elevated to 162/87 and persisted on recheck.  He presents today for reevaluation.  Blood pressure much improved.  He is at his baseline.  No other complaints or concerns at this time.  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/27/2020   Hyperlipidemia 04/26/2020   Former smoker 08/02/2019   Hepatic fibrosis 10/24/2017   Hx of adenomatous colonic polyps 10/24/2017   Pulmonary emphysema (HCC) 08/05/2016   Chronic back pain 01/12/2013   Insomnia 01/12/2013   Hepatitis C virus infection without hepatic coma 04/23/2012   COPD  GOLD IV  07/06/2007    Social Hx   Social History   Socioeconomic History   Marital status: Married    Spouse name: Not on file   Number of children: 2   Years of education: Not on file   Highest education level: Not on file  Occupational History   Occupation: disabled    Employer: DISABLED  Tobacco Use   Smoking status: Former    Current packs/day: 0.00    Average packs/day: 1.5 packs/day for 27.4 years (41.0 ttl pk-yrs)    Types: Cigarettes    Start date: 12/16/1974    Quit date: 04/23/2002    Years since quitting: 21.0   Smokeless tobacco: Current    Types: Chew  Vaping Use   Vaping status: Never Used  Substance and Sexual Activity   Alcohol use: Yes    Comment: 12 pack on weekend   Drug use: No    Comment: Hx marijuana, intranasal drugs, crack, cocaine.  QUIT 5 yrs ago   Sexual activity: Yes    Partners: Female    Comment: monagamous  Other Topics Concern   Not on file  Social History Narrative   Lives w/ wife, 2sons      [facility]  (08/05/16):   Originally from [place], [place]. Moved to [place] when he was 62 y.o. Since then he has lived in [place]. 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 No known asbestos exposure. Currently has dogs and a cat. No bird or mold exposure. Enjoys fishing.    Social Determinants of Health   Financial Resource Strain: Not on file  Food Insecurity: Not on file  Transportation Needs: Not on file  Physical Activity: Not on file  Stress: Not on file  Social Connections: Not on file    Review of Systems Per HPI  Objective:  BP 136/77   Pulse 60   Temp 98.1 F (36.7 C)   Ht 6' (1.829 m)   Wt 137 lb 6.4 oz (62.3 kg)   SpO2 95%   BMI 18.63 kg/m      05/20/2023   11:04 AM 05/06/2023   10:09 AM 01/14/2023   11:16 AM  BP/Weight  Systolic BP 136 162 103  Diastolic BP 77 87 62  Wt. (Lbs) 137.4 140.4 138.8  BMI 18.63 kg/m2 19.04 kg/m2 18.82 kg/m2    Physical Exam Vitals and nursing note reviewed.  Constitutional:      General:  He is not in acute distress.    Appearance: Normal appearance.  HENT:     Head: Normocephalic and atraumatic.  Cardiovascular:     Rate and Rhythm: Normal rate and regular rhythm.  Pulmonary:     Effort: Pulmonary effort is normal. No respiratory distress.  Neurological:     Mental Status: He is alert.     Lab Results  Component Value Date   WBC 5.1 05/06/2023   HGB 13.9 05/06/2023   HCT 43.5 05/06/2023   PLT 177 05/06/2023   GLUCOSE 91 05/06/2023   CHOL 183 05/06/2023   TRIG 122 05/06/2023   HDL 65 05/06/2023   LDLCALC 96 05/06/2023   ALT 11 05/06/2023   AST 20 05/06/2023   NA 143 05/06/2023   K 4.0 05/06/2023   CL 104 05/06/2023   CREATININE 0.79 05/06/2023   BUN 17 05/06/2023   CO2 31 05/06/2023   TSH 2.380 04/26/2022   PSA 0.46 05/06/2023   INR 1.08 07/29/2017   HGBA1C 5.5 04/26/2020     Assessment & Plan:   Problem List Items Addressed This Visit        Other   Elevated BP without diagnosis of hypertension - Primary    BP improved.  Will continue to monitor.       Follow-up:  Return in about 6 months (around 11/18/2023) for Follow up Chronic medical issues.  Everlene Other DO [facility]

## 2023-05-20 NOTE — Assessment & Plan Note (Signed)
BP improved.  Will continue to monitor.

## 2023-06-02 ENCOUNTER — Encounter: Payer: Self-pay | Admitting: Physical Medicine and Rehabilitation

## 2023-06-30 ENCOUNTER — Encounter: Payer: No Typology Code available for payment source | Admitting: Physical Medicine and Rehabilitation

## 2023-07-13 NOTE — Progress Notes (Unsigned)
Timothy Davis, male    DOB: 1960-10-25  MRN: [id number]   Brief patient profile:  67  yowm  MM/quit smoking 2003 / retired Psychologist, occupational with GOLD IV criteria in 09/13/16 referred to pulmonary clinic in [place]  06/27/2020 by Dr  Timothy Davis since closer to his home.  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 Breathing is unchanged since the last visit. He is his albuterol inhaler once a day on average and has not used albuterol neb. He has some cough in the evenings- prod with clear sputum.   Dyspnea: walks dogs slow pace x 30-40 min / does not do shopping  Cough: minimal mucoid Sleep: flat bed s resp issues SABA use: rare  02 none   Rec Stop anoro and the budesonide nebulizer (because they are equivalent of Breztri)  Plan A = Automatic = Always=    Breztri Take 2 puffs first thing in am and then another 2 puffs about 12 hours later.  Work on inhaler technique:   Plan B = Backup (to supplement plan A, not to replace it) Only use your albuterol inhaler as a rescue medication Plan C = Crisis (instead of Plan B but only if Plan B stops working) - only use your albuterol nebulizer if you first try Plan B and it fails to help Try albuterol 15 min before an activity that you know would make you short of breath and see if it makes any difference and if makes none then don't take it after activity unless you can't catch your breath. Please schedule a follow up visit in 6 months but call sooner if needed  Add: rec booster with moderna or pfizer if he indeed received the J/J > 6 m ago     01/24/2021  f/u ov/ Davis/Timothy Davis re:  COPD IV  Chief Complaint  Patient presents with   Follow-up    Breztri only working for 4-5 hours and then he can tell it wears off bc he starts feeling  more SOB. He has been coughing with clear to grey sputum and also wheezing. He is using his albuterol inhaler 3-4 x per day. He rarely uses neb.   COPD  GOLD IV  Quit smoking 2003  - 06/27/2020  After extensive coaching inhaler device,  effectiveness =    75% (short Ti) > try 2 week sample of breztri instead of anoro/pulmicort  Arc-welders' pneumoconiosis (HCC) Dyspnea:  still walking dog 4 x daily  Cough: just 15 min p inhaler / mucoid Sleeping: ok flat/ one pillow SABA use: 3-4 x daily  02: none  Covid status: vax x twice  Rec Plan A = Automatic = Always=   Breztri Take 2 puffs first thing in am and then another 2 puffs about 12 hours later.  Work on inhaler technique:  Plan B = Backup (to supplement plan A, not to replace it) Only use your albuterol inhaler as a rescue medication Plan C = Crisis (instead of Plan B but only if Plan B stops working) - only use your albuterol nebulizer if you first try Plan B and it fails to help > ok to use the nebulizer up to every 4 hours but if start needing it regularly call for immediate appointment  12/03/2022  f/u ov/Timothy Davis/Timothy Davis re: GOLD 4 copd doe  maint on breztri / prednisone 10 mg  Chief Complaint  Patient presents with   Follow-up    Pt f/u states that his breathing is baseline and he has no concerns.  Dyspnea:  would like to be able to keep up with his wife at [employer] but does not go to [employer] per wife  Cough: min discolored  Sleeping: level bed one pillow  SABA use: neb works the best  02: none  Rec Stop breztri and start stiolto 2 puffs each am instead  Prednisone 10 mg whole pill with breakfast for a week, then a half a pill thereafter if breathing or coughing worse, increase back to one daily until better then one - half My Davis will be contacting you by phone for referral to ENT    Call if you want to do pulmonary rehab - in meantime pace yourself when you are walking so you keep your oxygen saturations at  least in the upper 80s   Please schedule a follow up Davis visit in 6 weeks, call sooner if needed - bring inhalers and nebulizer solutions with you    01/14/2023  f/u ov/Timothy Davis/Timothy Davis re: GOLD 4/arc welders pneumoconiosis   maint on stiolto x 2 and pred 5 mg daily   Chief Complaint  Patient presents with   Follow-up  Dyspnea:  no change stiolto/ pred and feels no change in voice and declined ent referral and RT / prefers breztri rechallenge  Cough: min mucoid  Sleeping: level bed one pillow  SABA use: only when can't catch breath. Did not prechallenge as rc  02: none  Rec Plan A = Automatic = Always=    Breztri Take 2 puffs first thing in am and then another 2 puffs about 12 hours later.   Plan B = Backup (to supplement plan A, not to replace it) Only use your albuterol inhaler as a rescue medication Plan C = Crisis (instead of Plan B but only if Plan B stops working) - only use your albuterol nebulizer if you first try Plan B  Also  Ok to try albuterol 15 min before an activity (on alternating days)  that you know would usually make you short of breath    If you change your mind about oxygen with exercise, or the throat doctor, or rehab please call   Please schedule a follow up visit in 6  months but call sooner if needed     07/14/2023  f/u ov/Timothy Davis/Timothy Davis re: GOLD 4/ welders pneumoconosis  maint on breztri  and prn 02 but does not use it as Dietitian Complaint  Patient presents with   Follow-up    6 month follow up   Dyspnea:  Not limited by breathing from desired activities but  very sedentray  Cough: min mucoid  Sleeping: level bed one pillow s resp cc  SABA use: walking the dog uses saba hfa first seems to help doe  02: waits until gets doe  then sits down and uses 02 to recover    No obvious day to day or daytime variability or assoc excess/ purulent sputum or mucus plugs or hemoptysis or cp or chest tightness, subjective wheeze or overt sinus or hb  symptoms.    Also denies any obvious fluctuation of symptoms with weather or environmental changes or other aggravating or alleviating factors except as outlined above   No unusual exposure hx or h/o childhood  pna/ asthma or knowledge of premature birth.  Current Allergies, Complete Past Medical History, Past Surgical History, Family History, and Social History were reviewed in [employer] record.  ROS  The following are not active complaints unless bolded Hoarseness, sore throat, dysphagia, dental problems, itching, sneezing,  nasal congestion or discharge of excess mucus or purulent secretions, ear ache,   fever, chills, sweats, unintended wt loss or wt gain, classically pleuritic or exertional cp,  orthopnea pnd or arm/hand swelling  or leg swelling, presyncope, palpitations, abdominal pain, anorexia, nausea, vomiting, diarrhea  or change in bowel habits or change in bladder habits, change in stools or change in urine, dysuria, hematuria,  rash, arthralgias, visual complaints, headache, numbness, weakness or ataxia or problems with walking or coordination,  change in mood or  memory.        Current Meds  Medication Sig   albuterol (PROVENTIL) (2.5 MG/3ML) 0.083% nebulizer solution Take 3 mLs (2.5 mg total) by nebulization every 4 (four) hours as needed for wheezing or shortness of breath.   albuterol (VENTOLIN HFA) 108 (90 Base) MCG/ACT inhaler INHALE 2 PUFFS INTO THE LUNGS EVERY 6 HOURS AS NEEDED   Budeson-Glycopyrrol-Formoterol (BREZTRI AEROSPHERE) 160-9-4.8 MCG/ACT AERO Inhale 2 puffs into the lungs 2 (two) times daily.   oxyCODONE-acetaminophen (PERCOCET) 10-325 MG tablet Take 1 tablet by mouth every 4 (four) hours as needed.   STIOLTO RESPIMAT 2.5-2.5 MCG/ACT AERS SMARTSIG:2 Puff(s) Via Inhaler Daily   traZODone (DESYREL) 150 MG tablet Take 150 mg by mouth at bedtime.                   Objective:    Wts  07/14/2023     139  01/14/2023       138  12/03/2022          138   11/12/2022       139   10/23/2022       139  04/26/2022       139   01/28/2022         135  07/24/2021     134   01/24/21 143 lb 3.2 oz (65 kg)  10/24/20 153 lb (69.4 kg)  06/27/20 154 lb (69.9 kg)    Vital signs reviewed  07/14/2023  - Note at rest 02 sats  94% on RA   General appearance:    amb somber wm nad    HEENT : Oropharynx  clear     NECK :  without  apparent JVD/ palpable Nodes/TM    LUNGS: no acc muscle use,  Mild barrel  contour chest wall with bilateral  Distant bs s audible wheeze and  without cough on insp or exp maneuvers  and mild  Hyperresonant  to  percussion bilaterally     CV:  RRR  no s3 or murmur or increase in P2, and no edema   ABD:  soft and nontender with pos end  insp Hoover's  in the supine position.  No bruits or organomegaly appreciated   MS:  Nl gait/ ext warm without deformities Or obvious joint restrictions  calf tenderness, cyanosis  -  Mild clubbing     SKIN: warm and dry without lesions    NEURO:  alert, approp, nl sensorium with  no motor or cerebellar deficits apparent.                        Assessment

## 2023-07-14 ENCOUNTER — Ambulatory Visit: Payer: No Typology Code available for payment source | Admitting: Internal Medicine

## 2023-07-14 ENCOUNTER — Encounter: Payer: Self-pay | Admitting: Internal Medicine

## 2023-07-14 VITALS — BP 123/77 | HR 64 | Wt 139.2 lb

## 2023-07-14 DIAGNOSIS — R490 Dysphonia: Secondary | ICD-10-CM

## 2023-07-14 DIAGNOSIS — R0902 Hypoxemia: Secondary | ICD-10-CM

## 2023-07-14 DIAGNOSIS — J449 Chronic obstructive pulmonary disease, unspecified: Secondary | ICD-10-CM | POA: Diagnosis not present

## 2023-07-14 NOTE — Assessment & Plan Note (Signed)
12/03/2022   desat  with ex:  Timothy Davis on RA  x  3  lap(s) =  approx 450  ft  @ mod pace, stopped due to end of study  with lowest 02 sats 84% and mild sob p 2nd lap  >>> declined 02/ pulmonary rehab 12/03/22 and again 01/14/2023   Has 02 07/14/2023 but no using as rec  Advised again  Make sure you check your oxygen saturation  AT  your highest level of activity (not after you stop)   to be sure it stays over 90% and adjust  02 flow upward to maintain this level if needed but remember to turn it back to previous settings when you stop (to conserve your supply).

## 2023-07-14 NOTE — Patient Instructions (Signed)
Make sure you check your oxygen saturation  AT  your highest level of activity (not after you stop)   to be sure it stays over 90% and adjust  02 flow upward to maintain this level if needed but remember to turn it back to previous settings when you stop (to conserve your supply).   No change in medications   Please schedule a follow up visit in 6  months but call sooner if needed  

## 2023-07-14 NOTE — Assessment & Plan Note (Addendum)
Onset early 2024 - rx max gerd rx 10/23/2022 >>> - referred to ent  10/23/2022 >>> improved 11/12/2022 off breztri and on max gerd rx > worse 12/03/2022  to point where can barely understand him > referred to ENT > declined 01/14/2023 and requested breztri rechallenge as "voice not that bad"   Still hoarse, delined referral to ENT / no change in rx for now/ call for referral if he changes his mind         Each maintenance medication was reviewed in detail including emphasizing most importantly the difference between maintenance and prns and under what circumstances the prns are to be triggered using an action plan format where appropriate.  Total time for H and P, chart review, counseling, reviewing hfa/02 pulse ox  device(s) and generating customized AVS unique to this office visit / same day charting  > 30 min

## 2023-07-14 NOTE — Assessment & Plan Note (Signed)
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) - PFT's  11/11/19   FEV1 0.81 (20 % ) ratio 0.34  p 0 % improvement from saba p anoro prior to study with DLCO  19 (64%)   and FV curve classic severely concave   - 06/27/2020  After extensive coaching inhaler device,  effectiveness =    75% (short Ti) > try 2 week sample of breztri instead of anoro/pulmicort - 01/28/2022  After extensive coaching inhaler device,  effectiveness =    80% (still a bit short on the Ti) > continue breztri and more approp saba  - 01/28/2022   Walked on RA   x  3  lap(s) =  approx 450  ft  @ mod pace, stopped due to end of study sob p 1st lap with lowest 02 sats 94% - 04/26/2022   Walked on RA  x  3  lap(s) =  approx 450  ft  @   mod pace, stopped due to end of study  with lowest 02 sats 92% and sob on 2nd /3rd laps   - 10/23/2022  After extensive coaching inhaler device,  effectiveness =    75% smi so try stiolto and pred 20 mg daily until better then 10 mg daily until seen > used up sample of stiolto in one week no better  - 11/12/2022  After extensive coaching inhaler device,  effectiveness =    80% from a baseline of 50 % so try breztri 2 bid and max gerd rx/ pred 5 mg daily  - 11/12/2022   Walked on RA  x  3  lap(s) =  approx 450  ft  @ moderately fast  pace, stopped due to end of study, sob  with sats not registering  - 12/03/2022  After extensive coaching inhaler device,  effectiveness =    75% with smi > changed back to stiolto/ pred daily due to hoarseness - 12/03/2022  ex hypoxemia (see sep a/p, declined 02)  - 01/14/2023 preferred breztri > restart and d/c daily pred as did not feel it helped    Group D (now reclassified as E) in terms of symptom/risk and laba/lama/ICS  therefore appropriate rx at this point >>>  continue breztri

## 2023-07-31 ENCOUNTER — Other Ambulatory Visit: Payer: Self-pay | Admitting: Internal Medicine

## 2023-08-27 ENCOUNTER — Other Ambulatory Visit: Payer: Self-pay

## 2023-08-27 MED ORDER — ALBUTEROL SULFATE (2.5 MG/3ML) 0.083% IN NEBU: 2.5000 mg | INHALATION_SOLUTION | RESPIRATORY_TRACT | 12 refills | Status: AC | PRN

## 2023-08-28 ENCOUNTER — Other Ambulatory Visit (HOSPITAL_COMMUNITY): Payer: Self-pay

## 2023-08-28 ENCOUNTER — Telehealth: Payer: Self-pay

## 2023-08-28 NOTE — Telephone Encounter (Signed)
Pharmacy Patient Advocate Encounter   Received notification from CoverMyMeds that prior authorization for Albuterol Sulfate HFA 108 (90 Base)MCG/ACT aerosol  is required/requested.   Insurance verification completed.   The patient is insured through [facility] .   Per test claim: Refill too soon. PA is not needed at this time. Medication was filled 08/27/2023. Next eligible fill date is 02/12.

## 2023-09-16 DIAGNOSIS — M25519 Pain in unspecified shoulder: Secondary | ICD-10-CM | POA: Diagnosis not present

## 2023-09-16 DIAGNOSIS — M5451 Vertebrogenic low back pain: Secondary | ICD-10-CM | POA: Diagnosis not present

## 2023-09-16 DIAGNOSIS — G894 Chronic pain syndrome: Secondary | ICD-10-CM | POA: Diagnosis not present

## 2023-09-16 DIAGNOSIS — G4700 Insomnia, unspecified: Secondary | ICD-10-CM | POA: Diagnosis not present

## 2023-09-16 DIAGNOSIS — Z79891 Long term (current) use of opiate analgesic: Secondary | ICD-10-CM | POA: Diagnosis not present

## 2023-09-22 DIAGNOSIS — J449 Chronic obstructive pulmonary disease, unspecified: Secondary | ICD-10-CM | POA: Diagnosis not present

## 2023-09-25 ENCOUNTER — Other Ambulatory Visit: Payer: Self-pay | Admitting: Internal Medicine

## 2023-10-14 DIAGNOSIS — G894 Chronic pain syndrome: Secondary | ICD-10-CM | POA: Diagnosis not present

## 2023-10-14 DIAGNOSIS — M25519 Pain in unspecified shoulder: Secondary | ICD-10-CM | POA: Diagnosis not present

## 2023-10-14 DIAGNOSIS — M5451 Vertebrogenic low back pain: Secondary | ICD-10-CM | POA: Diagnosis not present

## 2023-10-14 DIAGNOSIS — G4700 Insomnia, unspecified: Secondary | ICD-10-CM | POA: Diagnosis not present

## 2023-10-14 DIAGNOSIS — Z79891 Long term (current) use of opiate analgesic: Secondary | ICD-10-CM | POA: Diagnosis not present

## 2023-10-20 DIAGNOSIS — J449 Chronic obstructive pulmonary disease, unspecified: Secondary | ICD-10-CM | POA: Diagnosis not present

## 2023-11-11 DIAGNOSIS — M5451 Vertebrogenic low back pain: Secondary | ICD-10-CM | POA: Diagnosis not present

## 2023-11-11 DIAGNOSIS — G4700 Insomnia, unspecified: Secondary | ICD-10-CM | POA: Diagnosis not present

## 2023-11-11 DIAGNOSIS — M25519 Pain in unspecified shoulder: Secondary | ICD-10-CM | POA: Diagnosis not present

## 2023-11-11 DIAGNOSIS — G894 Chronic pain syndrome: Secondary | ICD-10-CM | POA: Diagnosis not present

## 2023-11-11 DIAGNOSIS — Z79891 Long term (current) use of opiate analgesic: Secondary | ICD-10-CM | POA: Diagnosis not present

## 2023-11-17 ENCOUNTER — Ambulatory Visit (INDEPENDENT_AMBULATORY_CARE_PROVIDER_SITE_OTHER): Payer: No Typology Code available for payment source | Admitting: Family Medicine

## 2023-11-17 VITALS — BP 107/62 | HR 61 | Temp 98.2°F | Ht 72.0 in | Wt 133.0 lb

## 2023-11-17 DIAGNOSIS — J449 Chronic obstructive pulmonary disease, unspecified: Secondary | ICD-10-CM

## 2023-11-17 DIAGNOSIS — E785 Hyperlipidemia, unspecified: Secondary | ICD-10-CM

## 2023-11-17 NOTE — Assessment & Plan Note (Signed)
 Continue Breztri .  Stable at this time.

## 2023-11-17 NOTE — Assessment & Plan Note (Signed)
 Stable. Will continue to monitor.

## 2023-11-17 NOTE — Patient Instructions (Signed)
Continue your medications. Follow up annually.  Take care  Dr. Jahnasia Tatum  

## 2023-11-17 NOTE — Progress Notes (Signed)
 Subjective:  Patient ID: Timothy Davis, male    DOB: [birth year]  Age: 63 y.o. MRN: [id number]  CC:   Chief Complaint  Patient presents with   6 month follow up    htn    HPI:  63 year old male presents for follow-up.  COPD stable.  Compliant with Breztri .  Follows with pulmonology.  Fair control of lipids.  Last LDL 96.  Follows with pain management regarding chronic pain.  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/27/2020   Hyperlipidemia 04/26/2020   Former smoker 08/02/2019   Hepatic fibrosis 10/24/2017   Pulmonary emphysema (HCC) 08/05/2016   Chronic back pain 01/12/2013   Insomnia 01/12/2013   Hepatitis C virus infection without hepatic coma 04/23/2012   COPD  GOLD IV  07/06/2007    Social Hx   Social History   Socioeconomic History   Marital status: Married    Spouse name: Not on file   Number of children: 2   Years of education: Not on file   Highest education level: Not on file  Occupational History   Occupation: disabled    Employer: DISABLED  Tobacco Use   Smoking status: Former    Current packs/day: 0.00    Average packs/day: 1.5 packs/day for 27.4 years (41.0 ttl pk-yrs)    Types: Cigarettes    Start date: 12/16/1974    Quit date: 04/23/2002    Years since quitting: 21.5   Smokeless tobacco: Current    Types: Chew  Vaping Use   Vaping status: Never Used  Substance and Sexual Activity   Alcohol use: Yes    Comment: 12 pack on weekend   Drug use: No    Comment: Hx marijuana, intranasal drugs, crack, cocaine.  QUIT 5 yrs ago   Sexual activity: Yes    Partners: Female    Comment: monagamous  Other Topics Concern   Not on file  Social History Narrative   Lives w/ wife, 2sons      [facility] (08/05/16):   Originally from [place], [place]. Moved to [place] when he was 63 y.o.  Since then he has lived in [place]. 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 No known asbestos exposure. Currently has dogs and a cat. No bird or mold exposure. Enjoys fishing.    Social Drivers of Corporate investment banker Strain: Not on file  Food Insecurity: Not on file  Transportation Needs: Not on file  Physical Activity: Not on file  Stress: Not on file  Social Connections: Not on file    Review of Systems Per HPI  Objective:  BP 107/62   Pulse 61   Temp 98.2 F (36.8 C)   Ht 6' (1.829 m)   Wt 133 lb (60.3 kg)   SpO2 95%   BMI 18.04 kg/m      11/17/2023   11:07 AM 11/17/2023   10:42 AM 07/14/2023   10:57 AM  BP/Weight  Systolic BP 107 146 123  Diastolic BP 62 82 77  Wt. (Lbs)  133 139.2  BMI  18.04 kg/m2 18.88 kg/m2    Physical Exam Constitutional:      General: He is not in acute distress. HENT:     Head: Normocephalic and atraumatic.  Cardiovascular:  Rate and Rhythm: Normal rate and regular rhythm.  Pulmonary:     Effort: Pulmonary effort is normal.     Breath sounds: Normal breath sounds. No wheezing or rales.  Neurological:     Mental Status: He is alert.  Psychiatric:     Comments: Flat affect.  Depressed mood.     Lab Results  Component Value Date   WBC 5.1 05/06/2023   HGB 13.9 05/06/2023   HCT 43.5 05/06/2023   PLT 177 05/06/2023   GLUCOSE 91 05/06/2023   CHOL 183 05/06/2023   TRIG 122 05/06/2023   HDL 65 05/06/2023   LDLCALC 96 05/06/2023   ALT 11 05/06/2023   AST 20 05/06/2023   NA 143 05/06/2023   K 4.0 05/06/2023   CL 104 05/06/2023   CREATININE 0.79 05/06/2023   BUN 17 05/06/2023   CO2 31 05/06/2023   TSH 2.380 04/26/2022   PSA 0.46 05/06/2023   INR 1.08 07/29/2017   HGBA1C 5.5 04/26/2020     Assessment & Plan:  COPD  GOLD IV  Assessment & Plan: Continue Breztri .  Stable at this time.   Hyperlipidemia, unspecified hyperlipidemia  type Assessment & Plan: Stable.  Will continue to monitor.     Follow-up: Annually  Keria Widrig DO [facility]

## 2023-12-16 DIAGNOSIS — G894 Chronic pain syndrome: Secondary | ICD-10-CM | POA: Diagnosis not present

## 2023-12-16 DIAGNOSIS — G4700 Insomnia, unspecified: Secondary | ICD-10-CM | POA: Diagnosis not present

## 2023-12-16 DIAGNOSIS — M5451 Vertebrogenic low back pain: Secondary | ICD-10-CM | POA: Diagnosis not present

## 2023-12-16 DIAGNOSIS — M25519 Pain in unspecified shoulder: Secondary | ICD-10-CM | POA: Diagnosis not present

## 2024-01-11 NOTE — Progress Notes (Signed)
 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 Breathing is unchanged since the last visit. He is his albuterol  inhaler once a day on average and has not used albuterol  neb. He has some cough in the evenings- prod with clear sputum.   Dyspnea: walks dogs slow pace x 30-40 min / does not do shopping  Cough: minimal mucoid Sleep: flat bed s resp issues SABA use: rare  02 none   Rec Stop anoro and the budesonide  nebulizer (because they are equivalent of Breztri )  Plan A = Automatic = Always=    Breztri  Take 2 puffs first thing in am and then another 2 puffs about 12 hours later.  Work on inhaler technique:   Plan B = Backup (to supplement plan A, not to replace it) Only use your albuterol  inhaler as a rescue medication Plan C = Crisis (instead of Plan B but only if Plan B stops working) - only use your albuterol  nebulizer if you first try Plan B and it fails to help Try albuterol  15 min before an activity that you know would make you short of breath and see if it makes any difference and if makes none then don't take it after activity unless you can't catch your breath. Please schedule a follow up visit in 6 months but call sooner if needed  Add: rec booster with moderna or pfizer if he indeed received the J/J > 6 m ago     01/24/2021  f/u ov/[place] office/Dorris Pierre re:  COPD IV  Chief Complaint  Patient presents with   Follow-up    Breztri  only working for 4-5 hours and then he can tell it wears off bc he starts feeling  more SOB. He has been coughing with clear to grey sputum and also wheezing. He is using his albuterol  inhaler 3-4 x per day. He rarely uses neb.   COPD  GOLD IV  Quit smoking 2003  - 06/27/2020  After extensive coaching inhaler device,  effectiveness =    75% (short Ti) > try 2 week sample of breztri  instead of anoro/pulmicort   Arc-welders' pneumoconiosis (HCC) Dyspnea:  still walking dog 4 x daily  Cough: just 15 min p inhaler / mucoid Sleeping: ok flat/ one pillow SABA use: 3-4 x daily  02: none  Covid status: vax x twice  Rec Plan A = Automatic = Always=   Breztri  Take 2 puffs first thing in am and then another 2 puffs about 12 hours later.  Work on inhaler technique:  Plan B = Backup (to supplement plan A, not to replace it) Only use your albuterol  inhaler as a rescue medication Plan C = Crisis (instead of Plan B but only if Plan B stops working) - only use your albuterol  nebulizer if you first try Plan B and it fails to help > ok to use the nebulizer up to every 4 hours but if start needing it regularly call for immediate appointment  12/03/2022  f/u ov/[place] office/Liseth Wann re: GOLD 4 copd doe  maint on breztri  / prednisone  10 mg  Chief Complaint  Patient presents with   Follow-up    Pt f/u states that his breathing is baseline and he has no concerns.  Dyspnea:  would like to be able to keep up with his wife at [employer] but does not go to [employer] per wife  Cough: min discolored  Sleeping: level bed one pillow  SABA use: neb works the best  02: none  Rec Stop breztri  and start stiolto 2 puffs each am instead  Prednisone  10 mg whole pill with breakfast for a week, then a half a pill thereafter if breathing or coughing worse, increase back to one daily until better then one - half My office will be contacting you by phone for referral to ENT    Call if you want to do pulmonary rehab - in meantime pace yourself when you are walking so you keep your oxygen  saturations at  least in the upper 80s   Please schedule a follow up office visit in 6 weeks, call sooner if needed - bring inhalers and nebulizer solutions with you    01/14/2023  f/u ov/[place] office/Seferina Brokaw re: GOLD 4/arc welders pneumoconiosis   maint on stiolto x 2 and pred 5 mg daily   Chief Complaint  Patient presents with   Follow-up  Dyspnea:  no change stiolto/ pred and feels no change in voice and declined ent referral and RT / prefers breztri  rechallenge  Cough: min mucoid  Sleeping: level bed one pillow  SABA use: only when can't catch breath. Did not prechallenge as rc  02: none  Rec Plan A = Automatic = Always=    Breztri  Take 2 puffs first thing in am and then another 2 puffs about 12 hours later.   Plan B = Backup (to supplement plan A, not to replace it) Only use your albuterol  inhaler as a rescue medication Plan C = Crisis (instead of Plan B but only if Plan B stops working) - only use your albuterol  nebulizer if you first try Plan B  Also  Ok to try albuterol  15 min before an activity (on alternating days)  that you know would usually make you short of breath    If you change your mind about oxygen  with exercise, or the throat doctor, or rehab please call   Please schedule a follow up visit in 6  months but call sooner if needed     07/14/2023  f/u ov/[place] office/Breia Ocampo re: GOLD 4/ welders pneumoconosis  maint on breztri   and prn 02 but does not use it as Dietitian Complaint  Patient presents with   Follow-up    6 month follow up   Dyspnea:  Not limited by breathing from desired activities but  very [place]  Cough: min mucoid  Sleeping: level bed one pillow s resp cc  SABA use: walking the dog uses saba hfa first seems to help doe  02: waits until gets doe  then sits down and uses 02 to recover  Rec Make sure you check your oxygen  saturation  AT  your highest level of activity (not after you stop)   to be sure it stays over 90%   No change in medications      01/13/2024  f/u ov/[place] office/Cambelle Suchecki re: GOLD 4/ welders's pneumoconiosis  maint on Stiolto  one bid (was instructed to take 2 each am)  Chief Complaint  Patient presents with   Follow-up    shob  Dyspnea:  mb and back s 02 - hasn't tried to see if helps, not checking sats  Cough: dry sounding daytime mostly  Sleeping: flat one pillow s   resp cc  SABA use: alb inhaler p ex, has not tried prior as rec  02: prn      No obvious day to day or daytime variability or assoc excess/ purulent sputum or mucus plugs or hemoptysis or cp or chest tightness, subjective wheeze or overt sinus or hb symptoms.    Also denies any obvious fluctuation of symptoms with weather or environmental changes or other aggravating or alleviating factors except as outlined above   No unusual exposure hx or h/o childhood pna/ asthma or knowledge of premature birth.  Current Allergies, Complete Past Medical History, Past Surgical History, Family History, and Social History were reviewed in [employer] record.  ROS  The following are not active complaints unless bolded Hoarseness, sore throat, dysphagia, dental problems, itching, sneezing,  nasal congestion or discharge of excess mucus or purulent secretions, ear ache,   fever, chills, sweats, unintended wt loss or wt gain, classically pleuritic or exertional cp,  orthopnea pnd or arm/hand swelling  or leg swelling, presyncope, palpitations, abdominal pain, anorexia, nausea, vomiting, diarrhea  or change in bowel habits or change in bladder habits, change in stools or change in urine, dysuria, hematuria,  rash, arthralgias, visual complaints, headache, numbness, weakness or ataxia or problems with walking or coordination,  change in mood or  memory.        Current Meds  Medication Sig   albuterol  (PROVENTIL ) (2.5 MG/3ML) 0.083% nebulizer solution Take 3 mLs (2.5 mg total) by nebulization every 4 (four) hours as needed for wheezing or  shortness of breath.   albuterol  (VENTOLIN  HFA) 108 (90 Base) MCG/ACT inhaler INHALE 2 PUFFS INTO THE LUNGS EVERY 6 HOURS AS NEEDED   BREZTRI  AEROSPHERE 160-9-4.8 MCG/ACT AERO INHALE 2 PUFFS INTO THE LUNGS IN THE MORNING AND AT BEDTIME   gabapentin  (NEURONTIN ) 300 MG capsule Take 300 mg by mouth.   oxyCODONE-acetaminophen  (PERCOCET) 10-325 MG tablet Take 1 tablet by mouth every 4 (four) hours as needed.   STIOLTO RESPIMAT  2.5-2.5 MCG/ACT AERS SMARTSIG:2 Puff(s) Via Inhaler Daily   traZODone  (DESYREL ) 150 MG tablet Take 150 mg by mouth at bedtime.            Objective:    Wts  01/13/2024        131  07/14/2023     139  01/14/2023       138  12/03/2022         138   11/12/2022       139   10/23/2022       139  04/26/2022       139   01/28/2022         135  07/24/2021     134   01/24/21 143 lb 3.2 oz (65 kg)  10/24/20 153 lb (69.4 kg)  06/27/20 154 lb (69.9 kg)    Vital signs reviewed  01/13/2024  - Note at rest 02 sats  94% on RA   General appearance:    somb amb wm nad     HEENT : Oropharynx  clear   Nasal turbinates nl    NECK :  without  apparent JVD/ palpable Nodes/TM    LUNGS: no acc muscle use,  Mild barrel  contour chest wall  with bilateral  Distant bs s audible wheeze and  without cough on insp or exp maneuvers  and mild  Hyperresonant  to  percussion bilaterally     CV:  RRR  no s3 or murmur or increase in P2, and no edema   ABD:  soft and nontender    MS:  Nl gait/ ext warm without deformities Or obvious joint restrictions  calf tenderness, cyanosis - Mild  clubbing     SKIN: warm and dry without lesions    NEURO:  alert, approp, nl sensorium with  no motor or cerebellar deficits apparent.              Assessment

## 2024-01-13 ENCOUNTER — Ambulatory Visit (INDEPENDENT_AMBULATORY_CARE_PROVIDER_SITE_OTHER): Admitting: Internal Medicine

## 2024-01-13 ENCOUNTER — Encounter: Payer: Self-pay | Admitting: Internal Medicine

## 2024-01-13 VITALS — BP 116/69 | HR 68 | Ht 72.0 in | Wt 131.8 lb

## 2024-01-13 DIAGNOSIS — R0902 Hypoxemia: Secondary | ICD-10-CM

## 2024-01-13 DIAGNOSIS — J449 Chronic obstructive pulmonary disease, unspecified: Secondary | ICD-10-CM | POA: Diagnosis not present

## 2024-01-13 NOTE — Patient Instructions (Addendum)
 Plan A = Automatic = Always=   Stiolto 2 puffs 1st thing in  AM  (really high octane)   Plan B = Backup (to supplement plan A, not to replace it) Only use your albuterol  inhaler as a rescue medication to be used if you can't catch your breath by resting or doing a relaxed purse lip breathing pattern.  - The less you use it, the better it will work when you need it. - Ok to use the inhaler up to 2 puffs  every 4 hours if you must but call for appointment if use goes up over your usual need - Don't leave home without it !!  (think of it like starter fluid)   Plan C = Crisis (instead of Plan B but only if Plan B stops working) - only use your albuterol  nebulizer if you first try Plan B and it fails to help > ok to use the nebulizer up to every 4 hours but if start needing it regularly call for immediate appointment   Also  Ok to try albuterol  15 min before an activity (on alternating days between inhaler and the nebulizer )  that you know would usually make you short of breath and see if it makes any difference and if makes none then don't take albuterol  after activity unless you can't catch your breath as this means it's the resting that helps, not the albuterol .      Make sure you check your oxygen  saturation  AT  your highest level of activity (not after you stop)   to be sure it stays over 90% and adjust  02 flow upward to maintain this level if needed but remember to turn it back to previous settings when you stop (to conserve your supply).    Please schedule a follow up office visit in 6  months , call sooner if needed with all medications /inhalers/ solutions in hand so we can verify exactly what you are taking. This includes all medications from all doctors and over the counters

## 2024-01-16 NOTE — Assessment & Plan Note (Addendum)
 Quit smoking 2003/MM  08/05/16 Alpha-1 antitrypsin: MM (155) - PFT's  11/11/19   FEV1 0.81 (20 % ) ratio 0.34  p 0 % improvement from saba p anoro prior to study with DLCO  19 (64%)   and FV curve classic severely concave   - 06/27/2020  After extensive coaching inhaler device,  effectiveness =    75% (short Ti) > try 2 week sample of breztri  instead of anoro/pulmicort  - 01/28/2022  After extensive coaching inhaler device,  effectiveness =    80% (still a bit short on the Ti) > continue breztri  and more approp saba  - 01/28/2022   Walked on RA   x  3  lap(s) =  approx 450  ft  @ mod pace, stopped due to end of study sob p 1st lap with lowest 02 sats 94% - 04/26/2022   Walked on RA  x  3  lap(s) =  approx 450  ft  @   mod pace, stopped due to end of study  with lowest 02 sats 92% and sob on 2nd /3rd laps   - 10/23/2022  After extensive coaching inhaler device,  effectiveness =    75% smi so try stiolto and pred 20 mg daily until better then 10 mg daily until seen > used up sample of stiolto in one week no better  - 11/12/2022  After extensive coaching inhaler device,  effectiveness =    80% from a baseline of 50 % so try breztri  2 bid and max gerd rx/ pred 5 mg daily  - 11/12/2022   Walked on RA  x  3  lap(s) =  approx 450  ft  @ moderately fast  pace, stopped due to end of study, sob  with sats not registering  - 12/03/2022  After extensive coaching inhaler device,  effectiveness =    75% with smi > changed back to stiolto/ pred daily due to hoarseness - 12/03/2022  ex hypoxemia (see sep a/p, declined 02)  - 01/14/2023 preferred breztri  > restart and d/c daily pred as did not feel it helped  - 01/13/24 on stiolto one bid instead of breztri  so rec change to stiolto 2 puffs each am and approp saba  Pt is Group B in terms of symptom/risk and laba/lama therefore appropriate rx at this point >>>  stiolto and more approp saba:  Instructed:  Ok to try albuterol  15 min before an activity (on alternating days)  that you know  would usually make you short of breath and see if it makes any difference and if makes none then don't take albuterol  after activity unless you can't catch your breath as this means it's the resting that helps, not the albuterol .

## 2024-01-16 NOTE — Assessment & Plan Note (Signed)
 12/03/2022   desat  with ex:  Timothy Davis on RA  x  3  lap(s) =  approx 450  ft  @ mod pace, stopped due to end of study  with lowest 02 sats 84% and mild sob p 2nd lap  >>> declined 02/ pulmonary rehab 12/03/22 and again 01/14/2023   Again advised Make sure you check your oxygen  saturation  AT  your highest level of activity (not after you stop)   to be sure it stays over 90% and adjust  02 flow upward to maintain this level if needed but remember to turn it back to previous settings when you stop (to conserve your supply).          Each maintenance medication was reviewed in detail including emphasizing most importantly the difference between maintenance and prns and under what circumstances the prns are to be triggered using an action plan format where appropriate.  Total time for H and P, chart review, counseling, reviewing smi/ hfa/02/pulse ox  device(s) and generating customized AVS unique to this office visit / same day charting = 25 min

## 2024-01-20 DIAGNOSIS — J449 Chronic obstructive pulmonary disease, unspecified: Secondary | ICD-10-CM | POA: Diagnosis not present

## 2024-02-12 ENCOUNTER — Other Ambulatory Visit: Payer: Self-pay | Admitting: Internal Medicine

## 2024-02-19 DIAGNOSIS — J449 Chronic obstructive pulmonary disease, unspecified: Secondary | ICD-10-CM | POA: Diagnosis not present

## 2024-04-16 ENCOUNTER — Ambulatory Visit

## 2024-04-21 DIAGNOSIS — J449 Chronic obstructive pulmonary disease, unspecified: Secondary | ICD-10-CM | POA: Diagnosis not present

## 2024-05-14 ENCOUNTER — Ambulatory Visit

## 2024-06-30 ENCOUNTER — Encounter: Payer: Self-pay | Admitting: Internal Medicine

## 2024-06-30 ENCOUNTER — Ambulatory Visit: Admitting: Internal Medicine

## 2024-06-30 VITALS — BP 136/84 | HR 72 | Ht 72.0 in | Wt 135.0 lb

## 2024-06-30 DIAGNOSIS — R0902 Hypoxemia: Secondary | ICD-10-CM

## 2024-06-30 DIAGNOSIS — Z87891 Personal history of nicotine dependence: Secondary | ICD-10-CM | POA: Diagnosis not present

## 2024-06-30 DIAGNOSIS — J449 Chronic obstructive pulmonary disease, unspecified: Secondary | ICD-10-CM | POA: Diagnosis not present

## 2024-06-30 NOTE — Assessment & Plan Note (Addendum)
 Quit smoking 2003/MM  08/05/16 Alpha-1 antitrypsin: MM (155) - PFT's  11/11/19   FEV1 0.81 (20 % ) ratio 0.34  p 0 % improvement from saba p anoro prior to study with DLCO  19 (64%)   and FV curve classic severely concave   - 06/27/2020  After extensive coaching inhaler device,  effectiveness =    75% (short Ti) > try 2 week sample of breztri  instead of anoro/pulmicort  - 01/28/2022  After extensive coaching inhaler device,  effectiveness =    80% (still a bit short on the Ti) > continue breztri  and more approp saba  - 01/28/2022   Walked on RA   x  3  lap(s) =  approx 450  ft  @ mod pace, stopped due to end of study sob p 1st lap with lowest 02 sats 94% - 04/26/2022   Walked on RA  x  3  lap(s) =  approx 450  ft  @   mod pace, stopped due to end of study  with lowest 02 sats 92% and sob on 2nd /3rd laps   - 10/23/2022  After extensive coaching inhaler device,  effectiveness =    75% smi so try stiolto and pred 20 mg daily until better then 10 mg daily until seen > used up sample of stiolto in one week no better  - 11/12/2022  After extensive coaching inhaler device,  effectiveness =    80% from a baseline of 50 % so try breztri  2 bid and max gerd rx/ pred 5 mg daily  - 11/12/2022   Walked on RA  x  3  lap(s) =  approx 450  ft  @ moderately fast  pace, stopped due to end of study, sob  with sats not registering  - 12/03/2022  After extensive coaching inhaler device,  effectiveness =    75% with smi > changed back to stiolto/ pred daily due to hoarseness - 12/03/2022  ex hypoxemia (see sep a/p, declined 02)  - 01/14/2023 preferred breztri  > restart and d/c daily pred as did not feel it helped  - 01/13/24 on stiolto one bid instead of breztri  so rec change to stiolto 2 puffs each am and approp saba > preferred breztri     Group E in terms of symptoms/risk so  laba/lama/ICS  therefore appropriate rx at this point >>>  breztri    and approp SABA prn.

## 2024-06-30 NOTE — Patient Instructions (Addendum)
Make sure you check your oxygen saturation  AT  your highest level of activity (not after you stop)   to be sure it stays over 90% and adjust  02 flow upward to maintain this level if needed but remember to turn it back to previous settings when you stop (to conserve your supply).  ° °No change in medications  ° ° °Please schedule a follow up visit in 12  months but call sooner if needed  ° ° ° ° °

## 2024-06-30 NOTE — Assessment & Plan Note (Addendum)
 12/03/2022   desat  with ex:  Jeralyn on RA  x  3  lap(s) =  approx 450  ft  @ mod pace, stopped due to end of study  with lowest 02 sats 84% and mild sob p 2nd lap  >>> declined 02/ pulmonary rehab 12/03/22 and again 01/14/2023  - 06/30/2024 sats 84% on arrival and declined to use POC  Rec: Make sure you check your oxygen  saturation  AT  your highest level of activity (not after you stop)   to be sure it stays over 90% and adjust  02 flow upward to maintain this level if needed but remember to turn it back to previous settings when you stop (to conserve your supply).   F/u can be q 6 m, sooner prn          Each maintenance medication was reviewed in detail including emphasizing most importantly the difference between maintenance and prns and under what circumstances the prns are to be triggered using an action plan format where appropriate.  Total time for H and P, chart review, counseling, reviewing h32 min device(s) and generating customized AVS unique to this office visit / same day charting = 32 min

## 2024-06-30 NOTE — Progress Notes (Signed)
 Timothy Davis, male    DOB: 1960/12/04  MRN: [id number]   Brief patient profile:  20  yowm  MM/quit smoking 2003 / retired psychologist, occupational with GOLD IV criteria in 09/13/16 referred to pulmonary clinic in [place]  06/27/2020 by Dr  Theophilus since closer to his home.  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 Breathing is unchanged since the last visit. He is his albuterol  inhaler once a day on average and has not used albuterol  neb. He has some cough in the evenings- prod with clear sputum.   Dyspnea: walks dogs slow pace x 30-40 min / does not do shopping  Cough: minimal mucoid Sleep: flat bed s resp issues SABA use: rare  02 none   Rec Stop anoro and the budesonide  nebulizer (because they are equivalent of Breztri )  Plan A = Automatic = Always=    Breztri  Take 2 puffs first thing in am and then another 2 puffs about 12 hours later.  Work on inhaler technique:   Plan B = Backup (to supplement plan A, not to replace it) Only use your albuterol  inhaler as a rescue medication Plan C = Crisis (instead of Plan B but only if Plan B stops working) - only use your albuterol  nebulizer if you first try Plan B and it fails to help Try albuterol  15 min before an activity that you know would make you short of breath and see if it makes any difference and if makes none then don't take it after activity unless you can't catch your breath. Please schedule a follow up visit in 6 months but call sooner if needed  Add: rec booster with moderna or pfizer if he indeed received the J/J > 6 m ago     01/24/2021  f/u ov/ office/[name] re:  COPD IV  Chief Complaint  Patient presents with   Follow-up    Breztri  only working for 4-5 hours and then he can tell it wears off bc he starts feeling  more SOB. He has been coughing with clear to grey sputum and also wheezing. He is using his albuterol  inhaler 3-4 x per day. He rarely uses neb.   COPD  GOLD IV  Quit smoking 2003  - 06/27/2020  After extensive coaching inhaler device,  effectiveness =    75% (short Ti) > try 2 week sample of breztri  instead of anoro/pulmicort   Arc-welders' pneumoconiosis (HCC) Dyspnea:  still walking dog 4 x daily  Cough: just 15 min p inhaler / mucoid Sleeping: ok flat/ one pillow SABA use: 3-4 x daily  02: none  Covid status: vax x twice  Rec Plan A = Automatic = Always=   Breztri  Take 2 puffs first thing in am and then another 2 puffs about 12 hours later.  Work on inhaler technique:  Plan B = Backup (to supplement plan A, not to replace it) Only use your albuterol  inhaler as a rescue medication Plan C = Crisis (instead of Plan B but only if Plan B stops working) - only use your albuterol  nebulizer if you first try Plan B and it fails to help > ok to use the nebulizer up to every 4 hours but if start needing it regularly call for immediate appointment  07/14/2023  f/u ov/[name] [facility]/[name] re: GOLD 4/ welders pneumoconosis  maint on breztri   and prn 02 but does not use it as Dietitian Complaint  Patient presents with   Follow-up    6 month follow up   Dyspnea:  Not limited by breathing from desired activities but  very sedentray  Cough: min mucoid  Sleeping: level bed one pillow s resp cc  SABA use: walking the dog uses saba hfa first seems to help doe  02: waits until gets doe  then sits down and uses 02 to recover  Rec Make sure you check your oxygen  saturation  AT  your highest level of activity (not after you stop)   to be sure it stays over 90%   No change in medications     01/13/2024  f/u ov/[name] office/[name] re: GOLD 4/ welders's pneumoconiosis  maint on Stiolto  one bid (was instructed to take 2 each am)   Chief Complaint  Patient presents with   Follow-up     shob  Dyspnea:  mb and back s 02 - hasn't tried to see if helps, not checking sats  Cough: dry sounding daytime mostly  Sleeping: flat one pillow s   resp cc  SABA use: alb inhaler p ex, has not tried prior as rec  02: prn  Rec Plan A = Automatic = Always=   Stiolto 2 puffs 1st thing in  AM  (really high octane)  Plan B = Backup (to supplement plan A, not to replace it) Only use your albuterol  inhaler as a rescue medication Plan C = Crisis (instead of Plan B  Also  Ok to try albuterol  15 min before an activity (on alternating days between inhaler and the nebulizer )  that you know would usually make you short of breath     Make sure you check your oxygen  saturation  AT  your highest level of activity (not after you stop)   to be sure it stays over 90%   Please schedule a follow up office visit in 6  months , call sooner if needed with all medications /inhalers/ solutions in hand     06/30/2024  f/u ov/[place] office/[name] re:  GOLD 4/ welders's pneumoconiosis  maint on breztri    Chief Complaint  Patient presents with   COPD    Shob - coughing w/ colored mucus   Dyspnea:  very sedentary - no change doe / not checking sats walking as rec  Cough: no change  Sleeping: flat bed one pillow s    resp cc  SABA use: tid hfa/ not much neb  02: prn  and has POC but refuses to use it.   No obvious day to day or daytime variability or assoc excess/ purulent sputum or mucus plugs or hemoptysis or cp or chest tightness, subjective wheeze or overt sinus or hb symptoms.    Also denies any obvious fluctuation of symptoms with weather or environmental changes or other aggravating or alleviating factors except as outlined above   No unusual exposure hx or h/o childhood pna/ asthma or knowledge of premature birth.  Current Allergies, Complete Past Medical History, Past Surgical History, Family History, and Social History were reviewed in [employer] record.  ROS  The  following are not active complaints unless bolded Hoarseness, sore throat, dysphagia, dental problems, itching, sneezing,  nasal congestion or discharge of excess mucus or purulent secretions, ear ache,   fever, chills, sweats, unintended wt loss  or wt gain, classically pleuritic or exertional cp,  orthopnea pnd or arm/hand swelling  or leg swelling, presyncope, palpitations, abdominal pain, anorexia, nausea, vomiting, diarrhea  or change in bowel habits or change in bladder habits, change in stools or change in urine, dysuria, hematuria,  rash, arthralgias, visual complaints, headache, numbness, weakness or ataxia or problems with walking or coordination,  change in mood or  memory.        Current Meds  Medication Sig   albuterol  (PROVENTIL ) (2.5 MG/3ML) 0.083% nebulizer solution Take 3 mLs (2.5 mg total) by nebulization every 4 (four) hours as needed for wheezing or shortness of breath.   albuterol  (VENTOLIN  HFA) 108 (90 Base) MCG/ACT inhaler INHALE 2 PUFFS INTO THE LUNGS EVERY 6 HOURS AS NEEDED   gabapentin  (NEURONTIN ) 300 MG capsule Take 300 mg by mouth.   oxyCODONE-acetaminophen  (PERCOCET) 10-325 MG tablet Take 1 tablet by mouth every 4 (four) hours as needed.   Tiotropium Bromide -Olodaterol (STIOLTO RESPIMAT ) 2.5-2.5 MCG/ACT AERS INHALE TWO PUFFS INTO THE LUNGS DAILY   traZODone  (DESYREL ) 150 MG tablet Take 150 mg by mouth at bedtime.                    Past Medical History:  Diagnosis Date   Asthmatic bronchitis    Chronic back pain    COPD (chronic obstructive pulmonary disease) (HCC)    Hepatitis C antibody test positive    noticed trying to give blood, s/p treatment by ID, F4 on U/S pretreatment, F2/F3 fibrosis on U/S post treatment   Insomnia    Shortness of breath        Objective:    Wts  06/30/2024        135  01/13/2024        131  07/14/2023     139  01/14/2023       138  12/03/2022         138   11/12/2022       139   10/23/2022       139  04/26/2022       139    01/28/2022         135  07/24/2021     134   01/24/21 143 lb 3.2 oz (65 kg)  10/24/20 153 lb (69.4 kg)  06/27/20 154 lb (69.9 kg)     Vital signs reviewed  06/30/2024  - Note at rest 02 sats  84% on RA    General appearance:    somber soft spoken wm nad     HEENT : Oropharynx  nl   Nasal turbinates nl    NECK :  without  apparent JVD/ palpable Nodes/TM    LUNGS: no acc muscle use,  Mild barrel  contour chest wall with bilateral  Distant bs s audible wheeze and  without cough on insp or exp maneuvers  and mild  Hyperresonant  to  percussion bilaterally     CV:  RRR  no s3 or murmur or increase in P2, and no edema   ABD:  soft and nontender   MS:  Nl gait/ ext warm without deformities Or obvious joint restrictions  calf tenderness, cyanosis  - Pos for mild clubbing    SKIN: warm and dry without lesions    NEURO:  alert, approp, nl sensorium with  no motor or cerebellar deficits apparent.            Assessment   Assessment & Plan COPD  GOLD  IV  Quit smoking 2003/MM  08/05/16 Alpha-1 antitrypsin: MM (155) - PFT's  11/11/19   FEV1 0.81 (20 % ) ratio 0.34  p 0 % improvement from saba p anoro prior to study with DLCO  19 (64%)   and FV curve classic severely concave   - 06/27/2020  After extensive coaching inhaler device,  effectiveness =    75% (short Ti) > try 2 week sample of breztri  instead of anoro/pulmicort  - 01/28/2022  After extensive coaching inhaler device,  effectiveness =    80% (still a bit short on the Ti) > continue breztri  and more approp saba  - 01/28/2022   Walked on RA   x  3  lap(s) =  approx 450  ft  @ mod pace, stopped due to end of study sob p 1st lap with lowest 02 sats 94% - 04/26/2022   Walked on RA  x  3  lap(s) =  approx 450  ft  @   mod pace, stopped due to end of study  with lowest 02 sats 92% and sob on 2nd /3rd laps   - 10/23/2022  After extensive coaching inhaler device,  effectiveness =    75% smi so try stiolto and pred 20 mg daily until better then 10  mg daily until seen > used up sample of stiolto in one week no better  - 11/12/2022  After extensive coaching inhaler device,  effectiveness =    80% from a baseline of 50 % so try breztri  2 bid and max gerd rx/ pred 5 mg daily  - 11/12/2022   Walked on RA  x  3  lap(s) =  approx 450  ft  @ moderately fast  pace, stopped due to end of study, sob  with sats not registering  - 12/03/2022  After extensive coaching inhaler device,  effectiveness =    75% with smi > changed back to stiolto/ pred daily due to hoarseness - 12/03/2022  ex hypoxemia (see sep a/p, declined 02)  - 01/14/2023 preferred breztri  > restart and d/c daily pred as did not feel it helped  - 01/13/24 on stiolto one bid instead of breztri  so rec change to stiolto 2 puffs each am and approp saba > preferred breztri     Group E in terms of symptoms/risk so  laba/lama/ICS  therefore appropriate rx at this point >>>  breztri    and approp SABA prn.   Exercise hypoxemia 12/03/2022   desat  with ex:  Jeralyn on RA  x  3  lap(s) =  approx 450  ft  @ mod pace, stopped due to end of study  with lowest 02 sats 84% and mild sob p 2nd lap  >>> declined 02/ pulmonary rehab 12/03/22 and again 01/14/2023  - 06/30/2024 sats 84% on arrival and declined to use POC  Rec: Make sure you check your oxygen  saturation  AT  your highest level of activity (not after you stop)   to be sure it stays over 90% and adjust  02 flow upward to maintain this level if needed but remember to turn it back to previous settings when you stop (to conserve your supply).   F/u can be q 6 m, sooner prn          Each maintenance medication was reviewed in detail including emphasizing most importantly the difference between maintenance and prns and under what circumstances the prns are to be triggered using an action plan format where appropriate.  Total time for H and P, chart review, counseling, reviewing h32 min device(s) and generating customized AVS unique to this office visit / same  day charting = 32 min          AVS  Patient Instructions  Make sure you check your oxygen  saturation  AT  your highest level of activity (not after you stop)   to be sure it stays over 90% and adjust  02 flow upward to maintain this level if needed but remember to turn it back to previous settings when you stop (to conserve your supply).     No change in medications   Please schedule a follow up visit in 12  months but call sooner if needed    Ozell America, MD 06/30/2024

## 2024-07-23 ENCOUNTER — Other Ambulatory Visit: Payer: Self-pay | Admitting: Internal Medicine

## 2024-08-04 ENCOUNTER — Telehealth: Payer: Self-pay | Admitting: Internal Medicine

## 2024-08-04 NOTE — Telephone Encounter (Signed)
 Received letter from [facility] notifying us  that the pt has been filling rxs for stiolto and breztri . MAR still has both meds listed. Dr Darlean, please advise if pt should be taking both.

## 2024-08-04 NOTE — Telephone Encounter (Signed)
 Spoke with the pt  He has both stiolto and breztri - uses the stiolto only when breztri  causes hoarseness  He says that this is what MW had rec  He understands that he should not take both inhalers at the same  Will route to Dr. Darlean as RICK
